# Patient Record
Sex: Male | Born: 1945
Health system: Southern US, Community
[De-identification: ages and names within clinical notes are randomized; demographics above are authoritative.]

## PROBLEM LIST (undated history)

## (undated) DIAGNOSIS — E119 Type 2 diabetes mellitus without complications: Secondary | ICD-10-CM

## (undated) DIAGNOSIS — I10 Essential (primary) hypertension: Secondary | ICD-10-CM

## (undated) DIAGNOSIS — H269 Unspecified cataract: Secondary | ICD-10-CM

## (undated) DIAGNOSIS — M1990 Unspecified osteoarthritis, unspecified site: Secondary | ICD-10-CM

---

## 2002-01-18 ENCOUNTER — Encounter: Admission: RE | Admit: 2002-01-18 | Discharge: 2002-04-18 | Payer: Self-pay | Admitting: Pulmonary Disease

## 2002-04-26 ENCOUNTER — Encounter: Admission: RE | Admit: 2002-04-26 | Discharge: 2002-07-25 | Payer: Self-pay | Admitting: Pulmonary Disease

## 2002-07-25 ENCOUNTER — Encounter: Admission: RE | Admit: 2002-07-25 | Discharge: 2002-10-23 | Payer: Self-pay | Admitting: Pulmonary Disease

## 2004-02-23 ENCOUNTER — Emergency Department (HOSPITAL_COMMUNITY): Admission: EM | Admit: 2004-02-23 | Discharge: 2004-02-23 | Payer: Self-pay | Admitting: Emergency Medicine

## 2010-11-10 ENCOUNTER — Encounter: Payer: Self-pay | Admitting: Internal Medicine

## 2010-12-15 ENCOUNTER — Other Ambulatory Visit: Payer: Self-pay | Admitting: Internal Medicine

## 2012-03-03 ENCOUNTER — Telehealth: Payer: Self-pay

## 2012-03-03 NOTE — Telephone Encounter (Signed)
LM for pt to call to schedule colonoscopy.

## 2012-04-04 NOTE — Telephone Encounter (Signed)
LMOM to call.

## 2012-04-15 NOTE — Telephone Encounter (Signed)
Letter to pt and PCP.  

## 2013-09-23 ENCOUNTER — Emergency Department (HOSPITAL_COMMUNITY): Payer: 59

## 2013-09-23 ENCOUNTER — Inpatient Hospital Stay (HOSPITAL_COMMUNITY)
Admission: EM | Admit: 2013-09-23 | Discharge: 2013-09-29 | DRG: 552 | Disposition: A | Discharge status: Rehabilitation Facility | Payer: 59 | Attending: General Surgery | Admitting: General Surgery

## 2013-09-23 ENCOUNTER — Encounter (HOSPITAL_COMMUNITY): Payer: Self-pay | Admitting: Radiology

## 2013-09-23 DIAGNOSIS — T07XXXA Unspecified multiple injuries, initial encounter: Secondary | ICD-10-CM | POA: Diagnosis present

## 2013-09-23 DIAGNOSIS — S12000A Unspecified displaced fracture of first cervical vertebra, initial encounter for closed fracture: Secondary | ICD-10-CM | POA: Diagnosis present

## 2013-09-23 DIAGNOSIS — S129XXA Fracture of neck, unspecified, initial encounter: Secondary | ICD-10-CM

## 2013-09-23 DIAGNOSIS — R339 Retention of urine, unspecified: Secondary | ICD-10-CM | POA: Diagnosis present

## 2013-09-23 DIAGNOSIS — S0100XA Unspecified open wound of scalp, initial encounter: Secondary | ICD-10-CM | POA: Diagnosis present

## 2013-09-23 DIAGNOSIS — S27329A Contusion of lung, unspecified, initial encounter: Secondary | ICD-10-CM

## 2013-09-23 DIAGNOSIS — I10 Essential (primary) hypertension: Secondary | ICD-10-CM | POA: Diagnosis present

## 2013-09-23 DIAGNOSIS — G8190 Hemiplegia, unspecified affecting unspecified side: Secondary | ICD-10-CM | POA: Diagnosis present

## 2013-09-23 DIAGNOSIS — S0101XA Laceration without foreign body of scalp, initial encounter: Secondary | ICD-10-CM

## 2013-09-23 DIAGNOSIS — M542 Cervicalgia: Secondary | ICD-10-CM | POA: Diagnosis present

## 2013-09-23 DIAGNOSIS — E119 Type 2 diabetes mellitus without complications: Secondary | ICD-10-CM | POA: Diagnosis present

## 2013-09-23 DIAGNOSIS — Z5189 Encounter for other specified aftercare: Secondary | ICD-10-CM | POA: Diagnosis not present

## 2013-09-23 DIAGNOSIS — D62 Acute posthemorrhagic anemia: Secondary | ICD-10-CM | POA: Diagnosis not present

## 2013-09-23 DIAGNOSIS — S12200A Unspecified displaced fracture of third cervical vertebra, initial encounter for closed fracture: Secondary | ICD-10-CM | POA: Diagnosis present

## 2013-09-23 DIAGNOSIS — S0190XA Unspecified open wound of unspecified part of head, initial encounter: Secondary | ICD-10-CM

## 2013-09-23 DIAGNOSIS — S14109A Unspecified injury at unspecified level of cervical spinal cord, initial encounter: Secondary | ICD-10-CM

## 2013-09-23 HISTORY — DX: Essential (primary) hypertension: I10

## 2013-09-23 HISTORY — DX: Type 2 diabetes mellitus without complications: E11.9

## 2013-09-23 LAB — COMPREHENSIVE METABOLIC PANEL
ALT: 20 U/L (ref 0–53)
AST: 22 U/L (ref 0–37)
Albumin: 3.3 g/dL — ABNORMAL LOW (ref 3.5–5.2)
Alkaline Phosphatase: 43 U/L (ref 39–117)
Anion gap: 13 (ref 5–15)
BUN: 21 mg/dL (ref 6–23)
CO2: 21 mEq/L (ref 19–32)
Calcium: 8 mg/dL — ABNORMAL LOW (ref 8.4–10.5)
Chloride: 109 mEq/L (ref 96–112)
Creatinine, Ser: 1.1 mg/dL (ref 0.50–1.35)
GFR calc Af Amer: 79 mL/min — ABNORMAL LOW (ref >90)
GFR calc non Af Amer: 68 mL/min — ABNORMAL LOW (ref >90)
Glucose, Bld: 186 mg/dL — ABNORMAL HIGH (ref 70–99)
Potassium: 3.9 mEq/L (ref 3.7–5.3)
Sodium: 143 mEq/L (ref 137–147)
Total Bilirubin: 0.4 mg/dL (ref 0.3–1.2)
Total Protein: 5.8 g/dL — ABNORMAL LOW (ref 6.0–8.3)

## 2013-09-23 LAB — URINALYSIS, ROUTINE W REFLEX MICROSCOPIC
Bilirubin Urine: NEGATIVE
Glucose, UA: >1000 mg/dL — AB
Hgb urine dipstick: NEGATIVE
Ketones, ur: 15 mg/dL — AB
Leukocytes, UA: NEGATIVE
Nitrite: NEGATIVE
Protein, ur: NEGATIVE mg/dL
Specific Gravity, Urine: 1.01 (ref 1.005–1.030)
Urobilinogen, UA: 0.2 mg/dL (ref 0.0–1.0)
pH: 6.5 (ref 5.0–8.0)

## 2013-09-23 LAB — CBC
HCT: 37.6 % — ABNORMAL LOW (ref 39.0–52.0)
Hemoglobin: 12.1 g/dL — ABNORMAL LOW (ref 13.0–17.0)
MCH: 27.6 pg (ref 26.0–34.0)
MCHC: 32.2 g/dL (ref 30.0–36.0)
MCV: 85.8 fL (ref 78.0–100.0)
Platelets: 193 10*3/uL (ref 150–400)
RBC: 4.38 MIL/uL (ref 4.22–5.81)
RDW: 12.4 % (ref 11.5–15.5)
WBC: 6 10*3/uL (ref 4.0–10.5)

## 2013-09-23 LAB — I-STAT CHEM 8, ED
BUN: 22 mg/dL (ref 6–23)
Calcium, Ion: 1.13 mmol/L (ref 1.13–1.30)
Chloride: 107 mEq/L (ref 96–112)
Creatinine, Ser: 1.2 mg/dL (ref 0.50–1.35)
Glucose, Bld: 189 mg/dL — ABNORMAL HIGH (ref 70–99)
HCT: 38 % — ABNORMAL LOW (ref 39.0–52.0)
Hemoglobin: 12.9 g/dL — ABNORMAL LOW (ref 13.0–17.0)
Potassium: 3.6 mEq/L — ABNORMAL LOW (ref 3.7–5.3)
Sodium: 143 mEq/L (ref 137–147)
TCO2: 22 mmol/L (ref 0–100)

## 2013-09-23 LAB — URINE MICROSCOPIC-ADD ON

## 2013-09-23 LAB — SAMPLE TO BLOOD BANK

## 2013-09-23 LAB — GLUCOSE, CAPILLARY
Glucose-Capillary: 208 mg/dL — ABNORMAL HIGH (ref 70–99)
Glucose-Capillary: 216 mg/dL — ABNORMAL HIGH (ref 70–99)
Glucose-Capillary: 228 mg/dL — ABNORMAL HIGH (ref 70–99)

## 2013-09-23 LAB — PROTIME-INR
INR: 1.04 (ref 0.00–1.49)
Prothrombin Time: 13.6 seconds (ref 11.6–15.2)

## 2013-09-23 LAB — ETHANOL: Alcohol, Ethyl (B): <11 mg/dL (ref 0–11)

## 2013-09-23 LAB — MRSA PCR SCREENING: MRSA by PCR: NEGATIVE

## 2013-09-23 LAB — CBG MONITORING, ED: Glucose-Capillary: 192 mg/dL — ABNORMAL HIGH (ref 70–99)

## 2013-09-23 LAB — I-STAT CG4 LACTIC ACID, ED: Lactic Acid, Venous: 0.7 mmol/L (ref 0.5–2.2)

## 2013-09-23 LAB — CDS SEROLOGY

## 2013-09-23 MED ORDER — OXYCODONE HCL 5 MG PO TABS
5.0000 mg | ORAL_TABLET | ORAL | Status: DC | PRN
  Administered 2013-09-23 – 2013-09-26 (×5): 5 mg via ORAL
  Filled 2013-09-23 (×7): qty 1

## 2013-09-23 MED ORDER — CETYLPYRIDINIUM CHLORIDE 0.05 % MT LIQD
7.0000 mL | Freq: Two times a day (BID) | OROMUCOSAL | Status: DC
  Administered 2013-09-23 – 2013-09-29 (×13): 7 mL via OROMUCOSAL

## 2013-09-23 MED ORDER — DOCUSATE SODIUM 100 MG PO CAPS
100.0000 mg | ORAL_CAPSULE | Freq: Two times a day (BID) | ORAL | Status: DC
  Administered 2013-09-23 – 2013-09-26 (×8): 100 mg via ORAL
  Filled 2013-09-23 (×8): qty 1

## 2013-09-23 MED ORDER — FENTANYL CITRATE 0.05 MG/ML IJ SOLN
INTRAMUSCULAR | Status: AC
  Filled 2013-09-23: qty 2

## 2013-09-23 MED ORDER — ACETAMINOPHEN 325 MG PO TABS: 650.0000 mg | ORAL_TABLET | ORAL | Status: DC | PRN

## 2013-09-23 MED ORDER — ONDANSETRON HCL 4 MG PO TABS: 4.0000 mg | ORAL_TABLET | Freq: Four times a day (QID) | ORAL | Status: DC | PRN

## 2013-09-23 MED ORDER — CEFAZOLIN SODIUM-DEXTROSE 2-3 GM-% IV SOLR
INTRAVENOUS | Status: AC
  Administered 2013-09-23: 2000 mg via INTRAVENOUS
  Filled 2013-09-23: qty 50

## 2013-09-23 MED ORDER — OXYCODONE HCL 5 MG PO TABS
10.0000 mg | ORAL_TABLET | ORAL | Status: DC | PRN
  Administered 2013-09-23 – 2013-09-27 (×7): 10 mg via ORAL
  Filled 2013-09-23 (×6): qty 2

## 2013-09-23 MED ORDER — PANTOPRAZOLE SODIUM 40 MG PO TBEC
40.0000 mg | DELAYED_RELEASE_TABLET | Freq: Every day | ORAL | Status: DC
  Administered 2013-09-25 – 2013-09-29 (×5): 40 mg via ORAL
  Filled 2013-09-23 (×5): qty 1

## 2013-09-23 MED ORDER — ONDANSETRON HCL 4 MG/2ML IJ SOLN: 4.0000 mg | Freq: Four times a day (QID) | INTRAMUSCULAR | Status: DC | PRN

## 2013-09-23 MED ORDER — HYDROMORPHONE HCL PF 1 MG/ML IJ SOLN
INTRAMUSCULAR | Status: AC
  Administered 2013-09-23: 1 mg
  Filled 2013-09-23: qty 1

## 2013-09-23 MED ORDER — INSULIN ASPART 100 UNIT/ML ~~LOC~~ SOLN
0.0000 [IU] | Freq: Three times a day (TID) | SUBCUTANEOUS | Status: DC
  Administered 2013-09-23 (×2): 3 [IU] via SUBCUTANEOUS
  Administered 2013-09-24: 1 [IU] via SUBCUTANEOUS
  Administered 2013-09-24 – 2013-09-26 (×6): 2 [IU] via SUBCUTANEOUS
  Administered 2013-09-26: 1 [IU] via SUBCUTANEOUS
  Administered 2013-09-26: 2 [IU] via SUBCUTANEOUS
  Administered 2013-09-27 (×2): 1 [IU] via SUBCUTANEOUS
  Administered 2013-09-27: 2 [IU] via SUBCUTANEOUS
  Administered 2013-09-28: 1 [IU] via SUBCUTANEOUS
  Administered 2013-09-28: 2 [IU] via SUBCUTANEOUS

## 2013-09-23 MED ORDER — INSULIN ASPART 100 UNIT/ML ~~LOC~~ SOLN: 0.0000 [IU] | Freq: Every day | SUBCUTANEOUS | Status: DC

## 2013-09-23 MED ORDER — PANTOPRAZOLE SODIUM 40 MG IV SOLR
40.0000 mg | Freq: Every day | INTRAVENOUS | Status: DC
  Administered 2013-09-23 – 2013-09-24 (×2): 40 mg via INTRAVENOUS
  Filled 2013-09-23 (×2): qty 40

## 2013-09-23 MED ORDER — IOHEXOL 300 MG/ML  SOLN
80.0000 mL | Freq: Once | INTRAMUSCULAR | Status: AC | PRN
  Administered 2013-09-23: 80 mL via INTRAVENOUS

## 2013-09-23 MED ORDER — HYDROMORPHONE HCL PF 1 MG/ML IJ SOLN
1.0000 mg | INTRAMUSCULAR | Status: DC | PRN
  Administered 2013-09-23 – 2013-09-24 (×4): 1 mg via INTRAVENOUS
  Filled 2013-09-23 (×4): qty 1

## 2013-09-23 MED ORDER — TETANUS-DIPHTH-ACELL PERTUSSIS 5-2.5-18.5 LF-MCG/0.5 IM SUSP
0.5000 mL | Freq: Once | INTRAMUSCULAR | Status: AC
  Administered 2013-09-23: 0.5 mL via INTRAMUSCULAR

## 2013-09-23 MED ORDER — FENTANYL CITRATE 0.05 MG/ML IJ SOLN
50.0000 ug | Freq: Once | INTRAMUSCULAR | Status: AC
Start: 2013-09-23 — End: 2013-09-23
  Administered 2013-09-23: 50 ug via INTRAVENOUS

## 2013-09-23 MED ORDER — ONDANSETRON HCL 4 MG/2ML IJ SOLN
4.0000 mg | Freq: Once | INTRAMUSCULAR | Status: AC
  Administered 2013-09-23: 4 mg via INTRAVENOUS

## 2013-09-23 MED ORDER — ONDANSETRON HCL 4 MG/2ML IJ SOLN
INTRAMUSCULAR | Status: AC
  Filled 2013-09-23: qty 2

## 2013-09-23 MED ORDER — KCL IN DEXTROSE-NACL 20-5-0.45 MEQ/L-%-% IV SOLN
INTRAVENOUS | Status: DC
  Administered 2013-09-23: 100 mL via INTRAVENOUS
  Administered 2013-09-23: 100 mL/h via INTRAVENOUS
  Administered 2013-09-24: 03:00:00 via INTRAVENOUS
  Administered 2013-09-24: 10 mL via INTRAVENOUS
  Administered 2013-09-25 – 2013-09-26 (×2): via INTRAVENOUS
  Filled 2013-09-23 (×8): qty 1000

## 2013-09-23 MED ORDER — TETANUS-DIPHTH-ACELL PERTUSSIS 5-2.5-18.5 LF-MCG/0.5 IM SUSP
INTRAMUSCULAR | Status: AC
  Filled 2013-09-23: qty 0.5

## 2013-09-23 MED ORDER — ENOXAPARIN SODIUM 40 MG/0.4ML ~~LOC~~ SOLN
40.0000 mg | SUBCUTANEOUS | Status: DC
Start: 2013-09-23 — End: 2013-09-29
  Administered 2013-09-23 – 2013-09-29 (×7): 40 mg via SUBCUTANEOUS
  Filled 2013-09-23 (×7): qty 0.4

## 2013-09-23 MED ORDER — CEFAZOLIN SODIUM-DEXTROSE 2-3 GM-% IV SOLR
2.0000 g | Freq: Once | INTRAVENOUS | Status: AC
  Administered 2013-09-23: 2000 mg via INTRAVENOUS

## 2013-09-23 MED ORDER — SODIUM CHLORIDE 0.9 % IV SOLN
Freq: Once | INTRAVENOUS | Status: AC
  Administered 2013-09-23: 10 mL/h via INTRAVENOUS

## 2013-09-23 NOTE — ED Notes (Addendum)
Tolerating irrigation. Glass and gravel removed.

## 2013-09-23 NOTE — ED Notes (Signed)
Back into trauma B, no changes, pt remains flat, calm, NAD, interactive, skin W&D, resps e/u, no dyspnea noted, follows commands, moving RUE and RLE, unable to move LUE or LLE. Remains on NRB, temp 97.3 oral, new warm blankets applied.  Room temp setting remains on "warm".

## 2013-09-23 NOTE — ED Notes (Signed)
Pt onto CT table, spinal precautions maintained. Dr. Janee Mornhompson Trauma present. VSS. No changes. Pt remains alert, NAD, calm, interactive.

## 2013-09-23 NOTE — ED Notes (Signed)
Dr. [facility]

## 2013-09-23 NOTE — Progress Notes (Addendum)
Patient ID: [name], male   DOB: 12-08-1945, 68 y.o.   MRN: [id number] Follow up - Trauma and Critical Care  Patient Details:    [name] is an 68 y.o. male.  Lines/tubes :   Microbiology/Sepsis markers: Results for orders placed during the hospital encounter of 09/23/13  MRSA PCR SCREENING     Status: None   Collection Time    09/23/13  4:45 AM      Result Value Ref Range Status   MRSA by PCR NEGATIVE  NEGATIVE Final   Comment:            The GeneXpert MRSA Assay (FDA     approved for NASAL specimens     only), is one component of a     comprehensive MRSA colonization     surveillance program. It is not     intended to diagnose MRSA     infection nor to guide or     monitor treatment for     MRSA infections.    Anti-infectives:  Anti-infectives   Start     Dose/Rate Route Frequency Ordered Stop   09/23/13 0315  ceFAZolin (ANCEF) IVPB 2 g/50 mL premix     2 g 100 mL/hr over 30 Minutes Intravenous  Once 09/23/13 0301 09/23/13 0325      Best Practice/Protocols:  VTE Prophylaxis: Lovenox (prophylaxtic dose) GI Prophylaxis: Proton Pump Inhibitor  Consults:    Neurosurgery - [facility]  Events:  Subjective:    Overnight Issues: Complaining of shoulder pain.    Objective:  Vital signs for last 24 hours: Temp:  [97.3 F (36.3 C)-97.7 F (36.5 C)] 97.7 F (36.5 C) (08/01 0809) Pulse Rate:  [50-75] 63 (08/01 0700) Resp:  [15-30] 18 (08/01 0700) BP: (104-134)/(50-76) 129/56 mmHg (08/01 0700) SpO2:  [93 %-100 %] 100 % (08/01 0700) Weight:  [237 lb 7 oz (107.7 kg)] 237 lb 7 oz (107.7 kg) (08/01 0445)  Hemodynamic parameters for last 24 hours:    Intake/Output from previous day: 07/31 0701 - 08/01 0700 In: 866.7 [I.V.:866.7] Out: -   Intake/Output this shift:    Vent settings for last 24 hours:   n/a Physical Exam:  General: alert and no respiratory distress Neuro: alert, oriented and unable to move left side.  weak upper and lower right  extremity, but can follow commands. prob 2/5 strength.  + sensation on left Resp: clear to auscultation bilaterally CVS: RR&R GI: soft, nontender, BS WNL, no r/g Skin: no rash Extremities: no edema, no erythema, pulses WNL HEENT - numerous abrasions/cuts on head.  Repaired.    Results for orders placed during the hospital encounter of 09/23/13 (from the past 24 hour(s))  CDS SEROLOGY     Status: None   Collection Time    09/23/13  1:34 AM      Result Value Ref Range   CDS serology specimen       Value: SPECIMEN WILL BE HELD FOR 14 DAYS IF TESTING IS REQUIRED  COMPREHENSIVE METABOLIC PANEL     Status: Abnormal   Collection Time    09/23/13  1:34 AM      Result Value Ref Range   Sodium 143  137 - 147 mEq/L   Potassium 3.9  3.7 - 5.3 mEq/L   Chloride 109  96 - 112 mEq/L   CO2 21  19 - 32 mEq/L   Glucose, Bld 186 (*) 70 - 99 mg/dL   BUN 21  6 - 23 mg/dL  Creatinine, Ser 1.10  0.50 - 1.35 mg/dL   Calcium 8.0 (*) 8.4 - 10.5 mg/dL   Total Protein 5.8 (*) 6.0 - 8.3 g/dL   Albumin 3.3 (*) 3.5 - 5.2 g/dL   AST 22  0 - 37 U/L   ALT 20  0 - 53 U/L   Alkaline Phosphatase 43  39 - 117 U/L   Total Bilirubin 0.4  0.3 - 1.2 mg/dL   GFR calc non Af Amer 68 (*) >90 mL/min   GFR calc Af Amer 79 (*) >90 mL/min   Anion gap 13  5 - 15  CBC     Status: Abnormal   Collection Time    09/23/13  1:34 AM      Result Value Ref Range   WBC 6.0  4.0 - 10.5 K/uL   RBC 4.38  4.22 - 5.81 MIL/uL   Hemoglobin 12.1 (*) 13.0 - 17.0 g/dL   HCT 16.137.6 (*) 09.639.0 - 04.552.0 %   MCV 85.8  78.0 - 100.0 fL   MCH 27.6  26.0 - 34.0 pg   MCHC 32.2  30.0 - 36.0 g/dL   RDW 40.912.4  81.111.5 - 91.415.5 %   Platelets 193  150 - 400 K/uL  ETHANOL     Status: None   Collection Time    09/23/13  1:34 AM      Result Value Ref Range   Alcohol, Ethyl (B) <11  0 - 11 mg/dL  PROTIME-INR     Status: None   Collection Time    09/23/13  1:34 AM      Result Value Ref Range   Prothrombin Time 13.6  11.6 - 15.2 seconds   INR 1.04  0.00 -  1.49  SAMPLE TO BLOOD BANK     Status: None   Collection Time    09/23/13  1:34 AM      Result Value Ref Range   Blood Bank Specimen SAMPLE AVAILABLE FOR TESTING     Sample Expiration 09/24/2013    CBG MONITORING, ED     Status: Abnormal   Collection Time    09/23/13  1:43 AM      Result Value Ref Range   Glucose-Capillary 192 (*) 70 - 99 mg/dL  I-STAT CHEM 8, ED     Status: Abnormal   Collection Time    09/23/13  1:46 AM      Result Value Ref Range   Sodium 143  137 - 147 mEq/L   Potassium 3.6 (*) 3.7 - 5.3 mEq/L   Chloride 107  96 - 112 mEq/L   BUN 22  6 - 23 mg/dL   Creatinine, Ser 7.821.20  0.50 - 1.35 mg/dL   Glucose, Bld 956189 (*) 70 - 99 mg/dL   Calcium, Ion 2.131.13  0.861.13 - 1.30 mmol/L   TCO2 22  0 - 100 mmol/L   Hemoglobin 12.9 (*) 13.0 - 17.0 g/dL   HCT 57.838.0 (*) 46.939.0 - 62.952.0 %  I-STAT CG4 LACTIC ACID, ED     Status: None   Collection Time    09/23/13  1:47 AM      Result Value Ref Range   Lactic Acid, Venous 0.70  0.5 - 2.2 mmol/L  MRSA PCR SCREENING     Status: None   Collection Time    09/23/13  4:45 AM      Result Value Ref Range   MRSA by PCR NEGATIVE  NEGATIVE  GLUCOSE, CAPILLARY     Status:  Abnormal   Collection Time    09/23/13  8:14 AM      Result Value Ref Range   Glucose-Capillary 208 (*) 70 - 99 mg/dL     Assessment/Plan:   NEURO  Trauma-CNS:  spinal cord injury   Plan: neurosurg consult, supportive care, OT/PT consult.  PULM  Lung Trauma (with contusion of lung)   Plan: Pulmonary toilet, incentive spirometry  CARDIO  HTN -    Plan: hold antihypertensives for now.  Avoid hypotension to maintain good spinal cord perfusion.  RENAL  unable to assist self with voiding   Plan: Place foley  GI  No current issues   Plan: GI prophylaxis, clears until cabbell sees.    ID  Scalp lacerations   Plan: bacitracin to abrasions for cellulitis prevention  HEME  Anemia acute blood loss anemia)   Plan: Avoid other blood loss, no need to transfuse.    ENDO  Diabetes Mellitus (Type II)   Plan: Sliding scale insulin.    Global Issues   Will need consults for therapies and probable rehab consult.     LOS: 0 days   Additional comments:I reviewed the patient's new clinical lab test results.  Critical Care Total Time*: 32 min  Daziyah Cogan 09/23/2013  *Care during the described time interval was provided by me and/or other providers on the critical care team.  I have reviewed this patient's available data, including medical history, events of note, physical examination and test results as part of my evaluation.

## 2013-09-23 NOTE — ED Notes (Addendum)
Minimal movement of R leg noted in CT. Unable to move L upon command.

## 2013-09-23 NOTE — ED Notes (Signed)
cbg 192 

## 2013-09-23 NOTE — ED Notes (Signed)
Dr. Janee Mornhompson I&D and closing head wound. Pt remains A&O, interactive, calm.

## 2013-09-23 NOTE — ED Notes (Signed)
[facility]. Alert, NAD, calm, interactive. No changes.

## 2013-09-23 NOTE — ED Notes (Signed)
[facility]

## 2013-09-23 NOTE — ED Notes (Addendum)
CT ready, to CT, no changes.

## 2013-09-23 NOTE — ED Notes (Signed)
CBG Taken = 192

## 2013-09-23 NOTE — ED Notes (Addendum)
Moves R hand and FA, unable to grip or lift arm off of bed. Moves R foot, flexes and extends R foot, unable to lift leg off of bed. PERRL 2mm brisk.  Speech clear.Admits to a little sob and a little nausea. Denies dizziness. tolerated wound closure. Bleeding controlled. Hard collar replaced with ASPEN c-collar with EMT, c-spine precautions maintained.

## 2013-09-23 NOTE — Progress Notes (Signed)
Attempted insertion of foley catheter. Patient refused insertion. I educated him on the need for one and the procedure. He still refused and stated "maybe tomorrow".

## 2013-09-23 NOTE — Progress Notes (Signed)
Chaplain responded to level 2 trauma. No family present.
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
## 2013-09-23 NOTE — ED Notes (Signed)
Sister in law Gardiner Rhyme[facility]. Pending spouse arrival.

## 2013-09-23 NOTE — ED Notes (Addendum)
Chest and pelvis xray completed, preparing to go to CT.

## 2013-09-23 NOTE — Procedures (Signed)
Procedure note  Preprocedure diagnosis: Complex 12 cm scalp laceration, linear forhead laceration 2 cm x2 Post procedure diagnosis: Same Procedure: Simple closure complex 12 cm scalp laceration, Dermabond closure forehead lacerations 2 cm each Surgeon: Violeta GelinasBurke Ladajah Soltys Procedure: Emergency consent was obtained. Patient's scalp was prepped in sterile fashion. Local anesthetic was injected. The scalp was thoroughly irrigated of debris and glass. Some devitalized fragments of tissue were debrided. Complex laceration was then closed with a combination of staples and 3-0 Prolene sutures. 2 cm linear laceration left for head and 2 cm horizontal laceration above the right eye were both cleaned, prepped, and closed with Dermabond. He tolerated this well. Violeta GelinasBurke Victoria Henshaw, MD, MPH, FACS Trauma: [phone] General Surgery: [phone]

## 2013-09-23 NOTE — ED Notes (Signed)
CT complete, preparing to go back to trauma B, no changes, VSS.
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
## 2013-09-23 NOTE — ED Notes (Signed)
Attempting contact with wife Riley Lam(Eunice) [phone]

## 2013-09-24 ENCOUNTER — Inpatient Hospital Stay (HOSPITAL_COMMUNITY): Payer: 59

## 2013-09-24 LAB — GLUCOSE, CAPILLARY
Glucose-Capillary: 193 mg/dL — ABNORMAL HIGH (ref 70–99)
Glucose-Capillary: 173 mg/dL — ABNORMAL HIGH (ref 70–99)
Glucose-Capillary: 200 mg/dL — ABNORMAL HIGH (ref 70–99)
Glucose-Capillary: 129 mg/dL — ABNORMAL HIGH (ref 70–99)
Glucose-Capillary: 156 mg/dL — ABNORMAL HIGH (ref 70–99)
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
## 2013-09-25 DIAGNOSIS — IMO0002 Reserved for concepts with insufficient information to code with codable children: Secondary | ICD-10-CM

## 2013-09-25 LAB — BASIC METABOLIC PANEL
Anion gap: 9 (ref 5–15)
BUN: 11 mg/dL (ref 6–23)
CO2: 28 mEq/L (ref 19–32)
Calcium: 8.1 mg/dL — ABNORMAL LOW (ref 8.4–10.5)
Chloride: 104 mEq/L (ref 96–112)
Creatinine, Ser: 1.08 mg/dL (ref 0.50–1.35)
GFR calc Af Amer: 81 mL/min — ABNORMAL LOW (ref >90)
GFR calc non Af Amer: 70 mL/min — ABNORMAL LOW (ref >90)
Glucose, Bld: 139 mg/dL — ABNORMAL HIGH (ref 70–99)
Potassium: 4.5 mEq/L (ref 3.7–5.3)
Sodium: 141 mEq/L (ref 137–147)

## 2013-09-25 LAB — GLUCOSE, CAPILLARY
Glucose-Capillary: 187 mg/dL — ABNORMAL HIGH (ref 70–99)
Glucose-Capillary: 165 mg/dL — ABNORMAL HIGH (ref 70–99)
Glucose-Capillary: 164 mg/dL — ABNORMAL HIGH (ref 70–99)
Glucose-Capillary: 192 mg/dL — ABNORMAL HIGH (ref 70–99)

## 2013-09-25 LAB — CBC
HCT: 37.2 % — ABNORMAL LOW (ref 39.0–52.0)
Hemoglobin: 11.7 g/dL — ABNORMAL LOW (ref 13.0–17.0)
MCH: 27.6 pg (ref 26.0–34.0)
MCHC: 31.5 g/dL (ref 30.0–36.0)
MCV: 87.7 fL (ref 78.0–100.0)
Platelets: 164 10*3/uL (ref 150–400)
RBC: 4.24 MIL/uL (ref 4.22–5.81)
RDW: 12.4 % (ref 11.5–15.5)
WBC: 8.1 10*3/uL (ref 4.0–10.5)

## 2013-09-25 MED ORDER — METFORMIN HCL 500 MG PO TABS
250.0000 mg | ORAL_TABLET | Freq: Two times a day (BID) | ORAL | Status: DC
  Administered 2013-09-25 – 2013-09-29 (×8): 250 mg via ORAL
  Filled 2013-09-25 (×10): qty 1

## 2013-09-25 MED ORDER — GLYBURIDE 1.25 MG PO TABS
1.2500 mg | ORAL_TABLET | Freq: Two times a day (BID) | ORAL | Status: DC
  Administered 2013-09-25 – 2013-09-29 (×8): 1.25 mg via ORAL
  Filled 2013-09-25 (×11): qty 1

## 2013-09-25 MED ORDER — IRBESARTAN 150 MG PO TABS
150.0000 mg | ORAL_TABLET | Freq: Every day | ORAL | Status: DC
  Administered 2013-09-25 – 2013-09-29 (×5): 150 mg via ORAL
  Filled 2013-09-25 (×5): qty 1

## 2013-09-25 MED ORDER — AMLODIPINE BESYLATE-VALSARTAN 5-160 MG PO TABS: 1.0000 | ORAL_TABLET | Freq: Every day | ORAL | Status: DC

## 2013-09-25 MED ORDER — GLYBURIDE-METFORMIN 1.25-250 MG PO TABS: 1.0000 | ORAL_TABLET | Freq: Two times a day (BID) | ORAL | Status: DC

## 2013-09-25 MED ORDER — AMLODIPINE BESYLATE 5 MG PO TABS
5.0000 mg | ORAL_TABLET | Freq: Every day | ORAL | Status: DC
  Administered 2013-09-25 – 2013-09-29 (×5): 5 mg via ORAL
  Filled 2013-09-25 (×5): qty 1

## 2013-09-25 NOTE — Progress Notes (Signed)
Patient ID: [name], male   DOB: 1945-05-24, 68 y.o.   MRN: [id number] BP 155/59  Pulse 81  Temp(Src) 99.3 F (37.4 C) (Oral)  Resp 17  Ht 5\' 11"  (1.803 m)  Wt 107.7 kg (237 lb 7 oz)  BMI 33.13 kg/m2  SpO2 99% Alert, oriented x 4 Densely plegic on left side, below C6. Antigravity biceps, maybe triceps. Deltoid 2/5 No active compression, disc at C3/4 is small, but this area has more than likely seen abnormal movement as a result of ligamentous disruption. Will need stabilization in future but may go to rehab when they want him.

## 2013-09-25 NOTE — Progress Notes (Signed)
Trauma Service Note  Subjective: Patient is doing fine, being fed by his spouse.  Limited movement in his LLE, can flex left elbow and right elbow.  Minimal hand movement on either side.  Objective: Vital signs in last 24 hours: Temp:  [98 F (36.7 C)-99 F (37.2 C)] 98.6 F (37 C) (08/03 0736) Pulse Rate:  [62-83] 78 (08/03 0800) Resp:  [11-22] 19 (08/03 0800) BP: (127-162)/(49-70) 162/68 mmHg (08/03 0800) SpO2:  [98 %-100 %] 99 % (08/03 0800)    Intake/Output from previous day: 08/02 0701 - 08/03 0700 In: 2000 [P.O.:1580; I.V.:420] Out: 2370 [Urine:2370] Intake/Output this shift:    General: No acute distress.  Lungs: Clear to auscultation  Abd: Bening, eating without a problem  Extremities: No clinical signs or symptoms of DVT. Patient is on Lovenox.  Neuro: Intact  Lab Results: CBC   Recent Labs  09/23/13 0134 09/23/13 0146 09/25/13 0220  WBC 6.0  --  8.1  HGB 12.1* 12.9* 11.7*  HCT 37.6* 38.0* 37.2*  PLT 193  --  164   BMET  Recent Labs  09/23/13 0134 09/23/13 0146 09/25/13 0220  NA 143 143 141  K 3.9 3.6* 4.5  CL 109 107 104  CO2 21  --  28  GLUCOSE 186* 189* 139*  BUN 21 22 11   CREATININE 1.10 1.20 1.08  CALCIUM 8.0*  --  8.1*   PT/INR  Recent Labs  09/23/13 0134  LABPROT 13.6  INR 1.04   ABG No results found for this basename: PHART, PCO2, PO2, HCO3,  in the last 72 hours  Studies/Results: Mr Cervical Spine Wo Contrast  09/24/2013   CLINICAL DATA:  Cervical spine fracture. LEFT-greater-than-RIGHT weakness. C1 fracture.  EXAM: MRI CERVICAL SPINE WITHOUT CONTRAST  TECHNIQUE: Multiplanar, multisequence MR imaging of the cervical spine was performed. No intravenous contrast was administered.  COMPARISON:  09/23/2013.  FINDINGS: Alignment: Patient is tilted to the LEFT in the scanner. 2 mm posterior subluxation of C3 on C4. This may be traumatic in this patient with both prevertebral edema and interspinous ligament edema along with  cervical strain. Prior CT demonstrated C3 spinous process fracture.  Vertebrae: Bone marrow edema in the C3 spinous process. Edema is present in the anterior C1 ring with bilateral C1-C2 facet effusions. Craniocervical alignment appears within normal limits. Edema extends to the LEFT occipital condyles, likely reactive. Klippel-Feil with congenital fusion of C4-C5.  Cord: The cervical spinal canal is congenitally narrow. There is cord edema at C3, compatible with cord contusion. No epidural or intramedullary hematoma. Mild swelling at the level of the cord contusion.  Posterior Fossa: Within normal limits.  Vertebral Arteries: Flow voids present bilaterally.  Paraspinal tissues: As noted above, prevertebral edema associated with previous fracture. Cervical strain and interspinous ligament injuries centered around C3.  C1-C2: No epidural hematoma. The odontoid appears intact. No widening of the predental space on MRI with cervical collar present.  C2-C3:  Disc desiccation.  Central canal and foramina appear patent.  C3-C4: 2 mm retrolisthesis and a central disc protrusion produce moderate central stenosis. Mild posterior ligamentum flavum redundancy also contributes to the stenosis. Mild RIGHT foraminal stenosis associated with facet hypertrophy. LEFT neural foramen appears patent. The AP diameter of the thecal sac is just under 8 mm. Bilateral posttraumatic facet effusions are present.  C4-C5:  Rudimentary disc with ankylosis at C4-C5.  No stenosis.  C5-C6: Moderate central stenosis associated with LEFT paracentral disc protrusion which contacts and flattens the LEFT ventral aspect of the  cord with posterior displacement. LEFT foraminal encroachment is due to uncovertebral spurring. This potentially affects the LEFT C6 nerve. The RIGHT neural foramen appears patent.  C6-C7: Mild central stenosis associated with shallow broad-based disc osteophyte complex. Bilateral uncovertebral spurring produces bilateral foraminal  stenosis potentially affecting both C7 nerves.  C7-T1:  Central canal and foramina patent.  IMPRESSION: 1. Cervical spine fractures at C1 and C3 demonstrated on prior CT show expected bone marrow edema. Prevertebral and posterior paraspinal ligamentous and muscular edema consistent with cervical strain. 2. Cervical cord contusion at C3-C4, due to trauma and underlying stenosis. 2 mm of retrolisthesis of C3 on C4 may be traumatic or degenerative and likely contributed to the cord contusion. Mild swelling and edema in the cord without epidural hematoma. Central stenosis at C3-C4 is moderate. 3. 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 4. Congenitally narrow spinal canal with superimposed degenerative disease detailed above.   Electronically Signed   By: Andreas Newport M.D.   On: 09/24/2013 15:23    Anti-infectives: Anti-infectives   Start     Dose/Rate Route Frequency Ordered Stop   09/23/13 0315  ceFAZolin (ANCEF) IVPB 2 g/50 mL premix     2 g 100 mL/hr over 30 Minutes Intravenous  Once 09/23/13 0301 09/23/13 0325      Assessment/Plan: s/p  Transfer to SDU.  Rehab consultation.  LOS: 2 days   Marta Lamas. Gae Bon, MD, FACS [phone] Trauma Surgeon 09/25/2013

## 2013-09-25 NOTE — Progress Notes (Signed)
BP 155/59  Pulse 81  Temp(Src) 99.3 F (37.4 C) (Oral)  Resp 17  Ht 5\' 11"  (1.803 m)  Wt 107.7 kg (237 lb 7 oz)  BMI 33.13 kg/m2  SpO2 99% Alert and oriented x 4 No change neurologically, 2/5 at best in upper extremity Awaiting results of MRI, when it is performed.
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
## 2013-09-25 NOTE — Progress Notes (Signed)
Rehab Admissions Coordinator Note:  Patient was screened by [name] for appropriateness for an Inpatient Acute Rehab Consult.  At this time, we are recommending Inpatient Rehab consult.  Tejas Seawood, PT Rehabilitation Admissions Coordinator [phone]  
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
## 2013-09-26 DIAGNOSIS — R339 Retention of urine, unspecified: Secondary | ICD-10-CM

## 2013-09-26 DIAGNOSIS — IMO0002 Reserved for concepts with insufficient information to code with codable children: Secondary | ICD-10-CM

## 2013-09-26 LAB — GLUCOSE, CAPILLARY
Glucose-Capillary: 146 mg/dL — ABNORMAL HIGH (ref 70–99)
Glucose-Capillary: 192 mg/dL — ABNORMAL HIGH (ref 70–99)
Glucose-Capillary: 157 mg/dL — ABNORMAL HIGH (ref 70–99)
Glucose-Capillary: 142 mg/dL — ABNORMAL HIGH (ref 70–99)

## 2013-09-26 MED ORDER — TAMSULOSIN HCL 0.4 MG PO CAPS
0.4000 mg | ORAL_CAPSULE | Freq: Every day | ORAL | Status: DC
  Administered 2013-09-26 – 2013-09-29 (×4): 0.4 mg via ORAL
  Filled 2013-09-26 (×4): qty 1

## 2013-09-26 MED ORDER — BETHANECHOL CHLORIDE 25 MG PO TABS
25.0000 mg | ORAL_TABLET | Freq: Three times a day (TID) | ORAL | Status: DC
  Administered 2013-09-26 – 2013-09-29 (×10): 25 mg via ORAL
  Filled 2013-09-26 (×12): qty 1

## 2013-09-26 MED ORDER — SODIUM CHLORIDE 0.9 % IV BOLUS (SEPSIS)
500.0000 mL | Freq: Once | INTRAVENOUS | Status: AC
  Administered 2013-09-26: 500 mL via INTRAVENOUS

## 2013-09-26 NOTE — Progress Notes (Addendum)
Received orders to transfer, Pt and wife aware of transfer. Awaiting bed assignment. No s/s of acute distress noted, VS stable. [id number]: received bed assignment, attempted to call report to RN on 4N, awaiting call back. 1050: [facility]. VS stable.

## 2013-09-26 NOTE — Progress Notes (Signed)
UR completed.  Rhesa Forsberg, RN BSN MHA CCM Trauma/Neuro ICU Case Manager [phone]  
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
## 2013-09-26 NOTE — Progress Notes (Signed)
Pt complaining of being dizzy and light headed. BP checked manually 130/33. MD notified orders for 500cc NS bolus given. BP recheck after bolus 150/50.
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
## 2013-09-26 NOTE — Progress Notes (Signed)
Patient ID: [name], male   DOB: [birth year], 68 y.o.   MRN: [id number] BP 149/44  Pulse 80  Temp(Src) 98.8 F (37.1 C) (Oral)  Resp 18  Ht 5\' 10"  (1.778 m)  Wt 110.1 kg (242 lb 11.6 oz)  BMI 34.83 kg/m2  SpO2 98% Alert, oriented x 4 3/5 right,left biceps 2/5 triceps Intrinsics 1/5 Right lower extremity~3/5, left lower extremity with little movement Exam is stable. Will go to rehab soon. I will follow.

## 2013-09-27 DIAGNOSIS — I10 Essential (primary) hypertension: Secondary | ICD-10-CM | POA: Insufficient documentation

## 2013-09-27 DIAGNOSIS — S0101XA Laceration without foreign body of scalp, initial encounter: Secondary | ICD-10-CM

## 2013-09-27 DIAGNOSIS — D62 Acute posthemorrhagic anemia: Secondary | ICD-10-CM | POA: Diagnosis present

## 2013-09-27 DIAGNOSIS — S14109A Unspecified injury at unspecified level of cervical spinal cord, initial encounter: Secondary | ICD-10-CM

## 2013-09-27 DIAGNOSIS — T07XXXA Unspecified multiple injuries, initial encounter: Secondary | ICD-10-CM | POA: Diagnosis present

## 2013-09-27 DIAGNOSIS — E119 Type 2 diabetes mellitus without complications: Secondary | ICD-10-CM | POA: Insufficient documentation

## 2013-09-27 DIAGNOSIS — S129XXA Fracture of neck, unspecified, initial encounter: Secondary | ICD-10-CM | POA: Diagnosis present

## 2013-09-27 LAB — GLUCOSE, CAPILLARY
Glucose-Capillary: 127 mg/dL — ABNORMAL HIGH (ref 70–99)
Glucose-Capillary: 148 mg/dL — ABNORMAL HIGH (ref 70–99)
Glucose-Capillary: 154 mg/dL — ABNORMAL HIGH (ref 70–99)
Glucose-Capillary: 149 mg/dL — ABNORMAL HIGH (ref 70–99)

## 2013-09-27 MED ORDER — BACITRACIN ZINC 500 UNIT/GM EX OINT
TOPICAL_OINTMENT | Freq: Two times a day (BID) | CUTANEOUS | Status: DC
  Administered 2013-09-27 – 2013-09-29 (×5): via TOPICAL
  Filled 2013-09-27 (×2): qty 28.35

## 2013-09-27 MED ORDER — NAPROXEN 250 MG PO TABS
500.0000 mg | ORAL_TABLET | Freq: Two times a day (BID) | ORAL | Status: DC
Start: 2013-09-27 — End: 2013-09-29
  Administered 2013-09-27 – 2013-09-29 (×4): 500 mg via ORAL
  Filled 2013-09-27 (×4): qty 2

## 2013-09-27 MED ORDER — OXYCODONE HCL 5 MG PO TABS
5.0000 mg | ORAL_TABLET | ORAL | Status: DC | PRN
  Administered 2013-09-27: 5 mg via ORAL
  Administered 2013-09-27 – 2013-09-28 (×6): 10 mg via ORAL
  Administered 2013-09-29: 5 mg via ORAL
  Administered 2013-09-29 (×2): 10 mg via ORAL
  Filled 2013-09-27 (×5): qty 2
  Filled 2013-09-27: qty 3
  Filled 2013-09-27: qty 2
  Filled 2013-09-27: qty 1
  Filled 2013-09-27 (×2): qty 2

## 2013-09-27 MED ORDER — BISACODYL 10 MG RE SUPP
10.0000 mg | Freq: Every day | RECTAL | Status: DC | PRN
  Administered 2013-09-29: 10 mg via RECTAL
  Filled 2013-09-27: qty 1

## 2013-09-27 MED ORDER — DOCUSATE SODIUM 100 MG PO CAPS
200.0000 mg | ORAL_CAPSULE | Freq: Two times a day (BID) | ORAL | Status: DC
  Administered 2013-09-27 – 2013-09-29 (×5): 200 mg via ORAL
  Filled 2013-09-27 (×5): qty 2

## 2013-09-27 MED ORDER — HYDROMORPHONE HCL PF 1 MG/ML IJ SOLN: 0.5000 mg | INTRAMUSCULAR | Status: DC | PRN

## 2013-09-27 MED ORDER — POLYETHYLENE GLYCOL 3350 17 G PO PACK
17.0000 g | PACK | Freq: Every day | ORAL | Status: DC
  Administered 2013-09-27 – 2013-09-29 (×3): 17 g via ORAL
  Filled 2013-09-27 (×3): qty 1

## 2013-09-27 NOTE — Progress Notes (Signed)
Rehab admissions - I met with pt and his wife in follow up to my visit earlier today and explained the purpose of inpatient rehab. Further questions were answered and informational brochures were given. Pt had just returned to bed with nsg assistance after sitting up in recliner wheelchair. Pt was tired and drifted off to sleep during this discussion, but he did answer questions initially.  Wife provided baseline details and shared that she can provide 24 hr supervision as needed (she is a CNA as well). I explained that we are now waiting on insurance authorization from [facility] and pending his medical clearance and our bed availability, we possibly could admit pt to inpatient rehab tomorrow.  Pt's wife ask that I contact pt's son, Nithin Demeo and left a voicemail for him to return my call.  I will keep the pt/family and medical team aware of any updates as I wait for insurance authorization.  Please call me with any questions. Thanks.  Nanetta Batty, PT Rehabilitation Admissions Coordinator [phone]
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
## 2013-09-27 NOTE — Progress Notes (Signed)
Orthopedic Tech Progress Note Patient Details:  [name] 07-08-1945 [id number]  Ortho Devices Type of Ortho Device: Abdominal binder Ortho Device/Splint Interventions: Application   Shawnie PonsCammer, Jaydeen Odor Carol 09/27/2013, 11:12 AM
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
## 2013-09-27 NOTE — Progress Notes (Signed)
Neuro exam about the same. Plan CIR. Bowel regimine. Patient examined and I agree with the assessment and plan  Violeta GelinasBurke Disa Riedlinger, MD, MPH, FACS Trauma: [phone] General Surgery: [phone]  09/27/2013 9:53 AM
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
## 2013-09-27 NOTE — Progress Notes (Signed)
Rehab admissions - I briefly met with pt and gave initial information about our inpatient rehab program. OT was about to work with pt. Pt expressed interest in pursuing inpatient rehab and asked that I call his wife to give information as well.  I will now open his case with his [facility] to seek insurance authorization for inpatient rehab.  I gave the pt informational brochures about our rehab program and will come back later to talk with pt so as to not interfere with therapy.  I tried to call pt's wife and left a voicemail.  Please call me with any questions. Thanks.  Nanetta Batty, PT Rehabilitation Admissions Coordinator [phone]

## 2013-09-28 LAB — GLUCOSE, CAPILLARY
Glucose-Capillary: 131 mg/dL — ABNORMAL HIGH (ref 70–99)
Glucose-Capillary: 155 mg/dL — ABNORMAL HIGH (ref 70–99)
Glucose-Capillary: 110 mg/dL — ABNORMAL HIGH (ref 70–99)
Glucose-Capillary: 122 mg/dL — ABNORMAL HIGH (ref 70–99)
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
## 2013-09-28 NOTE — Progress Notes (Signed)
OT Cancellation Note  Patient Details Name: [name] MRN: [id number] DOB: May 19, 1945   Cancelled Treatment:     OT made visit during dinner to work on self-feeding goals, however pt recently up with PT and feels "worn out." Pt requested that OT come during lunch tomorrow. OT will continue to follow up with pt to increase independence with self-feeding.    [name], [name] [phone] 09/28/2013, 5:36 PM
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
## 2013-09-28 NOTE — Progress Notes (Signed)
Patient ID: [name], male   DOB: 1946/01/21, 68 y.o.   MRN: [id number] BP 150/60  Pulse 71  Temp(Src) 97.7 F (36.5 C) (Oral)  Resp 20  Ht 5\' 10"  (1.778 m)  Wt 110.1 kg (242 lb 11.6 oz)  BMI 34.83 kg/m2  SpO2 98% Alert and oriented x 4, speech is clear and fluent Better strength in left upper extremity, still 0-/5 in wrist extensors, intrinsics, grip Left much better, 2/5 grip,3/4 wrist extensors Not much change in left lower extremity.  Certainly can meet the criteria for inpatient rehab. Snf would undoubtedly set him back.

## 2013-09-28 NOTE — Progress Notes (Signed)
Rehab admissions - I am following pt's case and am waiting on further clarification from [employer] . [employer] has given an initial denial for inpatient rehab, stating that pt is currently at total assist and not appropriate for the intensity of inpatient rehab.   I met with pt and his wife/dtr and grandson to share this news and [employer] case manager Wallene Huh, RN was in the room speaking with pt and his family. Melissa and I discussed pt's case with the family and pt. Melissa plans to seek further clarification with [employer]. Pt and his family are strongly wanting to pursue inpatient rehab for aggressive rehab. Pt feels he can tolerate the intensity of inpatient rehab.  I spoke with Yvone Neu, PT and Fleeta Emmer, OT about pt's case and they were about to go see patient this afternoon.  I will follow pt's case and keep the pt/family and medical team aware of further updates. I updated Sharyn Lull, trauma case Freight forwarder and Raquel Sarna with social work as well.  Please call me with any questions. Thanks.  Nanetta Batty, PT Rehabilitation Admissions Coordinator [phone]

## 2013-09-29 ENCOUNTER — Inpatient Hospital Stay (HOSPITAL_COMMUNITY)
Admission: RE | Admit: 2013-09-29 | Discharge: 2013-10-27 | DRG: 945 | Disposition: A | Discharge status: Home Health | Payer: 59 | Source: Intra-hospital | Attending: Physical Medicine & Rehabilitation | Admitting: Physical Medicine & Rehabilitation

## 2013-09-29 DIAGNOSIS — E1149 Type 2 diabetes mellitus with other diabetic neurological complication: Secondary | ICD-10-CM | POA: Diagnosis present

## 2013-09-29 DIAGNOSIS — Z79899 Other long term (current) drug therapy: Secondary | ICD-10-CM | POA: Diagnosis not present

## 2013-09-29 DIAGNOSIS — K5900 Constipation, unspecified: Secondary | ICD-10-CM | POA: Diagnosis present

## 2013-09-29 DIAGNOSIS — K029 Dental caries, unspecified: Secondary | ICD-10-CM | POA: Diagnosis present

## 2013-09-29 DIAGNOSIS — S0100XA Unspecified open wound of scalp, initial encounter: Secondary | ICD-10-CM | POA: Diagnosis present

## 2013-09-29 DIAGNOSIS — R42 Dizziness and giddiness: Secondary | ICD-10-CM | POA: Diagnosis not present

## 2013-09-29 DIAGNOSIS — S069X0A Unspecified intracranial injury without loss of consciousness, initial encounter: Secondary | ICD-10-CM | POA: Diagnosis present

## 2013-09-29 DIAGNOSIS — S025XXA Fracture of tooth (traumatic), initial encounter for closed fracture: Secondary | ICD-10-CM | POA: Diagnosis present

## 2013-09-29 DIAGNOSIS — E1142 Type 2 diabetes mellitus with diabetic polyneuropathy: Secondary | ICD-10-CM | POA: Diagnosis present

## 2013-09-29 DIAGNOSIS — D62 Acute posthemorrhagic anemia: Secondary | ICD-10-CM

## 2013-09-29 DIAGNOSIS — IMO0002 Reserved for concepts with insufficient information to code with codable children: Secondary | ICD-10-CM

## 2013-09-29 DIAGNOSIS — S02109A Fracture of base of skull, unspecified side, initial encounter for closed fracture: Secondary | ICD-10-CM | POA: Diagnosis present

## 2013-09-29 DIAGNOSIS — E119 Type 2 diabetes mellitus without complications: Secondary | ICD-10-CM

## 2013-09-29 DIAGNOSIS — M24519 Contracture, unspecified shoulder: Secondary | ICD-10-CM | POA: Diagnosis present

## 2013-09-29 DIAGNOSIS — S0101XD Laceration without foreign body of scalp, subsequent encounter: Secondary | ICD-10-CM

## 2013-09-29 DIAGNOSIS — S129XXS Fracture of neck, unspecified, sequela: Secondary | ICD-10-CM

## 2013-09-29 DIAGNOSIS — R110 Nausea: Secondary | ICD-10-CM | POA: Diagnosis not present

## 2013-09-29 DIAGNOSIS — Z5189 Encounter for other specified aftercare: Principal | ICD-10-CM

## 2013-09-29 DIAGNOSIS — I10 Essential (primary) hypertension: Secondary | ICD-10-CM

## 2013-09-29 DIAGNOSIS — Y92410 Unspecified street and highway as the place of occurrence of the external cause: Secondary | ICD-10-CM

## 2013-09-29 DIAGNOSIS — G8389 Other specified paralytic syndromes: Secondary | ICD-10-CM | POA: Diagnosis present

## 2013-09-29 DIAGNOSIS — R339 Retention of urine, unspecified: Secondary | ICD-10-CM | POA: Diagnosis present

## 2013-09-29 DIAGNOSIS — N319 Neuromuscular dysfunction of bladder, unspecified: Secondary | ICD-10-CM | POA: Diagnosis present

## 2013-09-29 DIAGNOSIS — N390 Urinary tract infection, site not specified: Secondary | ICD-10-CM | POA: Diagnosis not present

## 2013-09-29 DIAGNOSIS — S022XXA Fracture of nasal bones, initial encounter for closed fracture: Secondary | ICD-10-CM | POA: Diagnosis present

## 2013-09-29 DIAGNOSIS — S14109A Unspecified injury at unspecified level of cervical spinal cord, initial encounter: Secondary | ICD-10-CM | POA: Diagnosis present

## 2013-09-29 DIAGNOSIS — F05 Delirium due to known physiological condition: Secondary | ICD-10-CM

## 2013-09-29 DIAGNOSIS — S0180XA Unspecified open wound of other part of head, initial encounter: Secondary | ICD-10-CM | POA: Diagnosis present

## 2013-09-29 DIAGNOSIS — S069X9A Unspecified intracranial injury with loss of consciousness of unspecified duration, initial encounter: Secondary | ICD-10-CM

## 2013-09-29 DIAGNOSIS — T07XXXA Unspecified multiple injuries, initial encounter: Secondary | ICD-10-CM

## 2013-09-29 DIAGNOSIS — S129XXD Fracture of neck, unspecified, subsequent encounter: Secondary | ICD-10-CM

## 2013-09-29 DIAGNOSIS — S069XAA Unspecified intracranial injury with loss of consciousness status unknown, initial encounter: Secondary | ICD-10-CM

## 2013-09-29 LAB — CBC
HCT: 39.6 % (ref 39.0–52.0)
Hemoglobin: 12.8 g/dL — ABNORMAL LOW (ref 13.0–17.0)
MCH: 28.1 pg (ref 26.0–34.0)
MCHC: 32.3 g/dL (ref 30.0–36.0)
MCV: 86.8 fL (ref 78.0–100.0)
Platelets: 220 10*3/uL (ref 150–400)
RBC: 4.56 MIL/uL (ref 4.22–5.81)
RDW: 12.2 % (ref 11.5–15.5)
WBC: 5.8 10*3/uL (ref 4.0–10.5)

## 2013-09-29 LAB — GLUCOSE, CAPILLARY
Glucose-Capillary: 112 mg/dL — ABNORMAL HIGH (ref 70–99)
Glucose-Capillary: 134 mg/dL — ABNORMAL HIGH (ref 70–99)
Glucose-Capillary: 131 mg/dL — ABNORMAL HIGH (ref 70–99)
Glucose-Capillary: 133 mg/dL — ABNORMAL HIGH (ref 70–99)

## 2013-09-29 LAB — CREATININE, SERUM
Creatinine, Ser: 0.96 mg/dL (ref 0.50–1.35)
GFR calc Af Amer: >90 mL/min (ref >90)
GFR calc non Af Amer: 84 mL/min — ABNORMAL LOW (ref >90)

## 2013-09-29 MED ORDER — METFORMIN HCL 500 MG PO TABS
250.0000 mg | ORAL_TABLET | Freq: Two times a day (BID) | ORAL | Status: DC
  Administered 2013-09-29 – 2013-10-04 (×10): 250 mg via ORAL
  Administered 2013-10-04: 12.5 mg via ORAL
  Administered 2013-10-05 – 2013-10-22 (×35): 250 mg via ORAL
  Administered 2013-10-22: 08:00:00 via ORAL
  Administered 2013-10-23 – 2013-10-27 (×9): 250 mg via ORAL
  Filled 2013-09-29 (×62): qty 1

## 2013-09-29 MED ORDER — INSULIN ASPART 100 UNIT/ML ~~LOC~~ SOLN: 0.0000 [IU] | Freq: Every day | SUBCUTANEOUS | Status: DC

## 2013-09-29 MED ORDER — DOCUSATE SODIUM 100 MG PO CAPS
200.0000 mg | ORAL_CAPSULE | Freq: Two times a day (BID) | ORAL | Status: DC
  Administered 2013-09-30 – 2013-10-06 (×13): 200 mg via ORAL
  Filled 2013-09-29 (×16): qty 2

## 2013-09-29 MED ORDER — TAMSULOSIN HCL 0.4 MG PO CAPS
0.4000 mg | ORAL_CAPSULE | Freq: Every day | ORAL | Status: DC
  Administered 2013-09-30 – 2013-10-26 (×27): 0.4 mg via ORAL
  Filled 2013-09-29 (×29): qty 1

## 2013-09-29 MED ORDER — ENOXAPARIN SODIUM 40 MG/0.4ML ~~LOC~~ SOLN: 40.0000 mg | SUBCUTANEOUS | Status: DC

## 2013-09-29 MED ORDER — ONDANSETRON HCL 4 MG PO TABS
4.0000 mg | ORAL_TABLET | Freq: Four times a day (QID) | ORAL | Status: DC | PRN
  Administered 2013-10-17 – 2013-10-21 (×2): 4 mg via ORAL
  Filled 2013-09-29 (×2): qty 1

## 2013-09-29 MED ORDER — BETHANECHOL CHLORIDE 10 MG PO TABS
10.0000 mg | ORAL_TABLET | Freq: Four times a day (QID) | ORAL | Status: DC
  Administered 2013-09-29 – 2013-10-09 (×39): 10 mg via ORAL
  Filled 2013-09-29 (×44): qty 1

## 2013-09-29 MED ORDER — BETHANECHOL CHLORIDE 10 MG PO TABS
10.0000 mg | ORAL_TABLET | Freq: Four times a day (QID) | ORAL | Status: DC
  Administered 2013-09-29: 10 mg via ORAL
  Filled 2013-09-29: qty 1

## 2013-09-29 MED ORDER — AMLODIPINE BESYLATE 5 MG PO TABS
5.0000 mg | ORAL_TABLET | Freq: Every day | ORAL | Status: DC
  Administered 2013-09-30 – 2013-10-27 (×28): 5 mg via ORAL
  Filled 2013-09-29 (×29): qty 1

## 2013-09-29 MED ORDER — ACETAMINOPHEN 325 MG PO TABS
650.0000 mg | ORAL_TABLET | ORAL | Status: DC | PRN
  Administered 2013-10-08 – 2013-10-25 (×10): 650 mg via ORAL
  Filled 2013-09-29 (×10): qty 2

## 2013-09-29 MED ORDER — SORBITOL 70 % SOLN
30.0000 mL | Freq: Every day | Status: DC | PRN
  Administered 2013-09-29 – 2013-10-05 (×3): 30 mL via ORAL
  Filled 2013-09-29 (×4): qty 30

## 2013-09-29 MED ORDER — CETYLPYRIDINIUM CHLORIDE 0.05 % MT LIQD
7.0000 mL | Freq: Two times a day (BID) | OROMUCOSAL | Status: DC
  Administered 2013-09-29 – 2013-10-27 (×52): 7 mL via OROMUCOSAL

## 2013-09-29 MED ORDER — NAPROXEN 500 MG PO TABS
500.0000 mg | ORAL_TABLET | Freq: Two times a day (BID) | ORAL | Status: DC
  Administered 2013-09-29 – 2013-10-27 (×56): 500 mg via ORAL
  Filled 2013-09-29 (×61): qty 1

## 2013-09-29 MED ORDER — GLYBURIDE 1.25 MG PO TABS
1.2500 mg | ORAL_TABLET | Freq: Two times a day (BID) | ORAL | Status: DC
  Administered 2013-09-29 – 2013-10-27 (×56): 1.25 mg via ORAL
  Filled 2013-09-29 (×58): qty 1

## 2013-09-29 MED ORDER — POLYETHYLENE GLYCOL 3350 17 G PO PACK
17.0000 g | PACK | Freq: Every day | ORAL | Status: DC
  Administered 2013-09-30 – 2013-10-02 (×3): 17 g via ORAL
  Filled 2013-09-29 (×4): qty 1

## 2013-09-29 MED ORDER — IRBESARTAN 150 MG PO TABS
150.0000 mg | ORAL_TABLET | Freq: Every day | ORAL | Status: DC
  Administered 2013-09-30: 150 mg via ORAL
  Filled 2013-09-29 (×2): qty 1

## 2013-09-29 MED ORDER — OXYCODONE HCL 5 MG PO TABS
5.0000 mg | ORAL_TABLET | ORAL | Status: DC | PRN
  Administered 2013-09-29 – 2013-10-02 (×9): 15 mg via ORAL
  Administered 2013-10-03: 10 mg via ORAL
  Administered 2013-10-03 – 2013-10-04 (×4): 15 mg via ORAL
  Administered 2013-10-04: 5 mg via ORAL
  Administered 2013-10-05: 15 mg via ORAL
  Administered 2013-10-05: 10 mg via ORAL
  Administered 2013-10-06 (×3): 15 mg via ORAL
  Administered 2013-10-07: 5 mg via ORAL
  Filled 2013-09-29: qty 1
  Filled 2013-09-29 (×5): qty 3
  Filled 2013-09-29 (×2): qty 2
  Filled 2013-09-29 (×4): qty 3
  Filled 2013-09-29: qty 1
  Filled 2013-09-29 (×8): qty 3

## 2013-09-29 MED ORDER — ONDANSETRON HCL 4 MG/2ML IJ SOLN
4.0000 mg | Freq: Four times a day (QID) | INTRAMUSCULAR | Status: DC | PRN
  Filled 2013-09-29: qty 2

## 2013-09-29 MED ORDER — BACITRACIN ZINC 500 UNIT/GM EX OINT
TOPICAL_OINTMENT | Freq: Two times a day (BID) | CUTANEOUS | Status: DC
  Administered 2013-09-30 – 2013-10-02 (×6): via TOPICAL
  Administered 2013-10-02: 1 via TOPICAL
  Administered 2013-10-03 – 2013-10-11 (×17): via TOPICAL
  Administered 2013-10-11: 1 via TOPICAL
  Administered 2013-10-12: 08:00:00 via TOPICAL
  Administered 2013-10-12: 1 via TOPICAL
  Administered 2013-10-13: 16:00:00 via TOPICAL
  Administered 2013-10-13: 1 via TOPICAL
  Administered 2013-10-14: 21:00:00 via TOPICAL
  Administered 2013-10-14: 1 via TOPICAL
  Administered 2013-10-15: 20:00:00 via TOPICAL
  Administered 2013-10-15: 1 via TOPICAL
  Administered 2013-10-16 (×2): via TOPICAL
  Administered 2013-10-17: 1 via TOPICAL
  Administered 2013-10-17 – 2013-10-24 (×15): via TOPICAL
  Filled 2013-09-29 (×2): qty 28.35
  Filled 2013-09-29: qty 15
  Filled 2013-09-29: qty 28.35

## 2013-09-29 MED ORDER — ENOXAPARIN SODIUM 40 MG/0.4ML ~~LOC~~ SOLN
40.0000 mg | Freq: Every day | SUBCUTANEOUS | Status: DC
  Administered 2013-09-30 – 2013-10-27 (×28): 40 mg via SUBCUTANEOUS
  Filled 2013-09-29 (×29): qty 0.4

## 2013-09-29 MED ORDER — BISACODYL 10 MG RE SUPP
10.0000 mg | Freq: Every day | RECTAL | Status: DC | PRN
  Filled 2013-09-29: qty 1
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
## 2013-09-29 NOTE — Progress Notes (Signed)
[facility]  After giving him some details from PT & OT he will go ahead and approve the Inpatient Reahb stay.  This patient has been seen and I agree with the findings and treatment plan.  Marta LamasJames O. Gae BonWyatt, III, MD, FACS [phone] (pager) [phone] (direct pager) Trauma Surgeon
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
## 2013-09-29 NOTE — Progress Notes (Signed)
Rehab admissions - We did receive word that insurance has now approved inpatient rehab stay following a peer to peer review with Dr. Lindie SpruceWyatt. I confirmed this news with [employer] Rn.  I received medical clearance from trauma and bed is available today. We will admit pt to inpatient rehab later today.  I called and updated pt's wife about the news and both pt/wife are pleased with this news. I will meet with them shortly to complete admission paperwork as pt is eating lunch now.  I called and updated Irving Burtonmily with social work and Radiographer, therapeuticMichelle case manager. Pt's RN is also aware.  Please call me with any questions. Thanks.  Juliann MuleJanine Jobin Montelongo, PT Rehabilitation Admissions Coordinator [phone]
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
## 2013-09-29 NOTE — Progress Notes (Signed)
Rehab admissions - I have met with pt and his wife and completed admission paper for inpatient rehab. Pt will be admitted later today to inpatient rehab. Rn aware. Family and pt are pleased. I called and spoke with pt's son Leroy Sea to share this update as well.  Please call me with any questions. Thanks.  Nanetta Batty, PT Rehabilitation Admissions Coordinator [phone]

## 2013-09-29 NOTE — Clinical Social Work Note (Addendum)
CSW met with pt and pt's family at bedside to discuss back-up discharge disposition (SNF) to CIR. Pt's wife stated pt's family only interest is for pt to be admitted to CIR. Pt's family denied SNF search at this time. CSW updated RNCM and CIR admissions liaison.  CSW to continue to follow and assist with discharge planning, if needed.  1:27pm CSW notified by CIR admissions liaison of pt's insurance authorization for CIR placement. Per CIR admissions liaison, pt to be admitted to CIR on 09/29/2013. CSW signing off.  Emily Vaughn, MSW, LCSWA Licensed Clinical Social Worker [id number] and [id number] [phone] 
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
## 2013-09-29 NOTE — Progress Notes (Signed)
Report given to 4W receiving nurse. Patient transferred out via bed in stable condition to room 4W15. Spouse present at the time of transfer and accompanied. .Marland Kitchen
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
## 2013-09-29 NOTE — Progress Notes (Signed)
Rehab admissions - I received an update from Quentin AngstMelissa Shreve, RN case manager with [facility] that she discussed pt's case with [facility] MD. Insurance is denying inpatient rehab. I spoke with pt and his wife about this news and they wish to appeal to this decision. Dr. Lindie SpruceWyatt, trauma attending will now complete a peer to peer review with [facility].  I will keep the pt/family and medical team aware of any updates. Pt remains a solid candidate for our inpatient rehab program and demonstrated good efforts with PT/OT yesterday afternoon. Pt would greatly benefit from the intensive inpatient rehab program.  Please call me with any questions. Thanks.  Juliann MuleJanine Suvi Archuletta, PT Rehabilitation Admissions Coordinator [phone]

## 2013-09-29 NOTE — Progress Notes (Signed)
Patient ID: [name], male   DOB: 1945/05/09, 68 y.o.   MRN: [id number] Patient arrived to floor via bed, escorted by nursing staff and family.  Upon assessment, noticed some purulent drainage from head wound.  MD paged, orders for wound culture in progress.  Patient states he needs to use bedpan, assisted to use bedpan, required manual disimpaction of small amount of hard stool, soft stool now noted in rectal vault.  Patient verbalized understanding of rehab process including safety plan, no questions asked.  Will continue to monitor. [name], [name] J, RN

## 2013-09-29 NOTE — Progress Notes (Signed)
The patient is doing exceedingly well and would benefit from inpatient rehab.    This patient has been seen and I agree with the findings and treatment plan.  Marta LamasJames O. Gae BonWyatt, III, MD, FACS [phone] (pager) [phone] (direct pager) Trauma Surgeon

## 2013-09-30 ENCOUNTER — Inpatient Hospital Stay (HOSPITAL_COMMUNITY): Payer: 59 | Admitting: *Deleted

## 2013-09-30 ENCOUNTER — Inpatient Hospital Stay (HOSPITAL_COMMUNITY): Payer: 59 | Admitting: Physical Therapy

## 2013-09-30 ENCOUNTER — Inpatient Hospital Stay (HOSPITAL_COMMUNITY): Payer: 59 | Admitting: Speech Pathology

## 2013-09-30 ENCOUNTER — Encounter (HOSPITAL_COMMUNITY): Payer: 59 | Admitting: *Deleted

## 2013-09-30 ENCOUNTER — Inpatient Hospital Stay (HOSPITAL_COMMUNITY): Payer: 59

## 2013-09-30 DIAGNOSIS — M7989 Other specified soft tissue disorders: Secondary | ICD-10-CM

## 2013-09-30 DIAGNOSIS — IMO0002 Reserved for concepts with insufficient information to code with codable children: Secondary | ICD-10-CM

## 2013-09-30 LAB — GLUCOSE, CAPILLARY
Glucose-Capillary: 124 mg/dL — ABNORMAL HIGH (ref 70–99)
Glucose-Capillary: 145 mg/dL — ABNORMAL HIGH (ref 70–99)
Glucose-Capillary: 155 mg/dL — ABNORMAL HIGH (ref 70–99)
Glucose-Capillary: 162 mg/dL — ABNORMAL HIGH (ref 70–99)

## 2013-09-30 MED ORDER — IRBESARTAN 300 MG PO TABS
300.0000 mg | ORAL_TABLET | Freq: Every day | ORAL | Status: DC
  Administered 2013-10-01 – 2013-10-26 (×26): 300 mg via ORAL
  Filled 2013-09-30 (×28): qty 1
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
## 2013-09-30 NOTE — Progress Notes (Signed)
Nursing Note: Pt scanned and then in and out cathed for 800 cc by [employer] CompanyJobell,NT wbb

## 2013-09-30 NOTE — Progress Notes (Signed)
Nursing Note: Pt had incont. Void.A: bladder scanned for 891 cc. R: Pt in and out cathed by Jobelle,NT for 1000 cc.Pt stated that he felt "so much better".wbb
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
## 2013-09-30 NOTE — Progress Notes (Signed)
VASCULAR LAB PRELIMINARY  PRELIMINARY  PRELIMINARY  PRELIMINARY  Bilateral lower extremity venous Dopplers completed.    Preliminary report:  There is no DVT or SVT noted in the bilateral lower extremities.   Niveah Boerner, RVT 09/30/2013, 5:43 PM
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
## 2013-10-01 ENCOUNTER — Inpatient Hospital Stay (HOSPITAL_COMMUNITY): Payer: 59 | Admitting: Physical Therapy

## 2013-10-01 LAB — GLUCOSE, CAPILLARY
Glucose-Capillary: 115 mg/dL — ABNORMAL HIGH (ref 70–99)
Glucose-Capillary: 136 mg/dL — ABNORMAL HIGH (ref 70–99)
Glucose-Capillary: 129 mg/dL — ABNORMAL HIGH (ref 70–99)
Glucose-Capillary: 117 mg/dL — ABNORMAL HIGH (ref 70–99)
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
## 2013-10-01 NOTE — Progress Notes (Signed)
Physical Therapy Session Note  Patient Details  Name: [name] MRN: [id number] Date of Birth: [birth year]  Today's Date: 10/01/2013 Time: 0932-1032 Time Calculation (min): 60 min  Short Term Goals: Week 1:  PT Short Term Goal 1 (Week 1): Pt will increased rolling R with side rail via log rolling and max to total [name].  PT Short Term Goal 2 (Week 1): Pt will increase rolling L with side rail via log rolling and max [name].  PT Short Term Goal 3 (Week 1): Pt will increase supine to edge of bed, edge of bed to supine with side rail and max [name].  PT Short Term Goal 4 (Week 1): Pt will increase transfers to sliding board with total  [name] x 2.  PT Short Term Goal 5 (Week 1): Pt will increase w/c mobility to 25 feet with max [name].   Skilled Therapeutic Interventions/Progress Updates:    Pt received supine in bed and stated his bladder felt full, alerted nursing and unable to perform in/out cath as bladder not full enough yet per nursing. Pt performed roll side to side x 3 reps with pt requiring max assist +2 to complete, pt is able to roll to the L with slight decreased assistance as compared to the R.  Therapist performed stretching and NMR to B LEs in supine with L LE displaying increased tone > with L hip flexion, decreased from max to min after stretching.  Abdominal binder and TED hose donned and performed supine to SL to sit with total assist +2 with pt attempting to assist and pt able to partially engage trunk to get to full upright sitting EOB, Pt c/o dizziness upon initial sitting and received instructions for deep breathing to improve, BP 128/68, pt improved gradually to CGA to min assist and 2-3 mins for c/o dizziness to improve.  NMR in sitting for WBing through R and L UEs, pt able to reach across midline with target and reach anterior with decreased motor control but able to reach target, seated R hip flexion and LAQs and therapist PROM to L LE. Pt able to tolerate approximately 20 minutes of sitting EOB  and then reported he felt his bladder was full again. Pt incontinent bladder, returned to supine, doffed abdominal binder and TED hose,  all needs within reach and nursing alerted to incontinence. Pt very pleasant and very motivated, sensation in B LE and UE seems to be improving per pt report.    Therapy Documentation Precautions:  Precautions Precautions: Cervical;Fall Precaution Comments: discussed log roll for cervical precautions Required Braces or Orthoses: Cervical Brace;Other Brace/Splint Cervical Brace: Hard collar;At all times Other Brace/Splint: don calf high ACE Wraps and Abd. binder Restrictions Weight Bearing Restrictions: No    Vital Signs:sitting EOB 128/68   Pain: Pain Assessment 5/10 cervical region Pain Score: 0-No pain                  See FIM for current functional status  Therapy/Group: Individual Therapy  [name], [name] 10/01/2013, 12:06 PM

## 2013-10-01 NOTE — IPOC Note (Signed)
Overall Plan of Care [facility](IPOC) Patient Details Name: [name] MRN: [id number] DOB: [birth year]  Admitting Diagnosis: Fxs TBI   Hospital Problems: Active Problems:   SCI (spinal cord injury)     Functional Problem List: Nursing Bladder;Bowel;Medication Management;Motor;Pain;Perception;Sensory;Skin Integrity  PT Balance;Endurance;Motor  OT Balance;Cognition;Edema;Endurance;Motor;Safety;Sensory  SLP Cognition;Safety  TR         Basic ADL's: OT Eating;Grooming;Bathing;Dressing;Toileting     Advanced  ADL's: OT       Transfers: PT Bed Mobility;Bed to Chair  OT Toilet     Locomotion: PT Wheelchair Mobility;Ambulation     Additional Impairments: OT Fuctional Use of Upper Extremity  SLP        TR      Anticipated Outcomes Item Anticipated Outcome  Self Feeding minimal assist  Swallowing  Ind   Basic self-care  minimal assist with bathing and dressing  Toileting  direct toileting needs with minimal verbal cues   Bathroom Transfers minimal assist to toilet  Bowel/Bladder  Min assist  Transfers  min A for bed mobility, min A for transfers  Locomotion  mod I for w/c mobility  Communication  Ind  Cognition  mod I  Pain  < 3  Safety/Judgment  Mod I   Therapy Plan: PT Intensity: Minimum of 1-2 x/day ,45 to 90 minutes PT Frequency: 5 out of 7 days PT Duration Estimated Length of Stay: 28 to 30 days OT Intensity: Minimum of 1-2 x/day, 45 to 90 minutes OT Frequency: 5 out of 7 days OT Duration/Estimated Length of Stay: 28-30 days SLP Intensity: Minumum of 1-2 x/day, 30 to 90 minutes SLP Frequency: 5 out of 7 days SLP Duration/Estimated Length of Stay: 2 weeks       Team Interventions: Nursing Interventions Patient/Family Education;Bladder Management;Bowel Management;Medication Management;Pain Management;Disease Management/Prevention;Skin Care/Wound Management;Dysphagia/Aspiration Precaution Training;Cognitive Remediation/Compensation;Discharge Planning   PT interventions Balance/vestibular training;Ambulation/gait training;DME/adaptive equipment instruction;Functional mobility training;Neuromuscular re-education;Discharge planning;Splinting/orthotics;UE/LE Coordination activities;UE/LE Strength taining/ROM;Therapeutic Exercise;Therapeutic Activities;Patient/family education;Stair training;Wheelchair propulsion/positioning  OT Interventions Balance/vestibular training;Cognitive remediation/compensation;Discharge planning;Community reintegration;Disease mangement/prevention;DME/adaptive equipment instruction;Functional electrical stimulation;Functional mobility training;Neuromuscular re-education;Pain management;Patient/family education;Psychosocial support;Self Care/advanced ADL retraining;Skin care/wound managment;Splinting/orthotics;Therapeutic Activities;Therapeutic Exercise;UE/LE Strength taining/ROM;UE/LE Coordination activities;Wheelchair propulsion/positioning  SLP Interventions Cognitive remediation/compensation;Environmental controls;Speech/Language facilitation;Cueing hierarchy;Functional tasks;Therapeutic Activities;Internal/external aids;Medication managment;Patient/family education  TR Interventions    SW/CM Interventions      Team Discharge Planning: Destination: PT-Home ,OT- Home , SLP-Home Projected Follow-up: PT-Home health PT, OT-  Home health OT;24 hour supervision/assistance, SLP-None Projected Equipment Needs: PT-To be determined, OT- 3 in 1 bedside comode (dropped arm 3n1 commode chair), SLP-None recommended by SLP Equipment Details: PT- , OT-  Patient/family involved in discharge planning: PT- Patient,  OT-Patient, SLP-Patient  MD ELOS: 25-30 days Medical Rehab Prognosis:  Excellent Assessment: The patient has been admitted for CIR therapies with the diagnosis of cervical fractures with SCI. The team will be addressing functional mobility, strength, stamina, balance, safety, adaptive techniques and equipment, self-care, bowel  and bladder mgt, patient and caregiver education, NMR, orthotics, pain mgt, egosupport, speech, swallowing, communication, and cognition. Goals have been set at mod I with speech and communication, min assist with self-care, and min assist to mod I with functional mobility.    [name], MD, FAAPMR      See Team Conference Notes for weekly updates to the plan of care

## 2013-10-02 ENCOUNTER — Inpatient Hospital Stay (HOSPITAL_COMMUNITY): Payer: 59 | Admitting: *Deleted

## 2013-10-02 ENCOUNTER — Inpatient Hospital Stay (HOSPITAL_COMMUNITY): Payer: 59 | Admitting: Speech Pathology

## 2013-10-02 ENCOUNTER — Inpatient Hospital Stay (HOSPITAL_COMMUNITY): Payer: 59 | Admitting: Physical Therapy

## 2013-10-02 ENCOUNTER — Inpatient Hospital Stay (HOSPITAL_COMMUNITY): Payer: 59

## 2013-10-02 DIAGNOSIS — IMO0002 Reserved for concepts with insufficient information to code with codable children: Secondary | ICD-10-CM

## 2013-10-02 DIAGNOSIS — D62 Acute posthemorrhagic anemia: Secondary | ICD-10-CM

## 2013-10-02 DIAGNOSIS — I10 Essential (primary) hypertension: Secondary | ICD-10-CM

## 2013-10-02 DIAGNOSIS — S069XAA Unspecified intracranial injury with loss of consciousness status unknown, initial encounter: Secondary | ICD-10-CM

## 2013-10-02 DIAGNOSIS — S069X9A Unspecified intracranial injury with loss of consciousness of unspecified duration, initial encounter: Secondary | ICD-10-CM

## 2013-10-02 LAB — COMPREHENSIVE METABOLIC PANEL
ALT: 65 U/L — ABNORMAL HIGH (ref 0–53)
AST: 55 U/L — ABNORMAL HIGH (ref 0–37)
Albumin: 3 g/dL — ABNORMAL LOW (ref 3.5–5.2)
Alkaline Phosphatase: 71 U/L (ref 39–117)
Anion gap: 11 (ref 5–15)
BUN: 28 mg/dL — ABNORMAL HIGH (ref 6–23)
CO2: 26 mEq/L (ref 19–32)
Calcium: 8.7 mg/dL (ref 8.4–10.5)
Chloride: 102 mEq/L (ref 96–112)
Creatinine, Ser: 1.16 mg/dL (ref 0.50–1.35)
GFR calc Af Amer: 74 mL/min — ABNORMAL LOW (ref >90)
GFR calc non Af Amer: 64 mL/min — ABNORMAL LOW (ref >90)
Glucose, Bld: 148 mg/dL — ABNORMAL HIGH (ref 70–99)
Potassium: 4.8 mEq/L (ref 3.7–5.3)
Sodium: 139 mEq/L (ref 137–147)
Total Bilirubin: 1 mg/dL (ref 0.3–1.2)
Total Protein: 6.3 g/dL (ref 6.0–8.3)

## 2013-10-02 LAB — CBC WITH DIFFERENTIAL/PLATELET
Basophils Absolute: 0 10*3/uL (ref 0.0–0.1)
Basophils Relative: 1 % (ref 0–1)
Eosinophils Absolute: 0.1 10*3/uL (ref 0.0–0.7)
Eosinophils Relative: 2 % (ref 0–5)
HCT: 35 % — ABNORMAL LOW (ref 39.0–52.0)
Hemoglobin: 11.3 g/dL — ABNORMAL LOW (ref 13.0–17.0)
Lymphocytes Relative: 11 % — ABNORMAL LOW (ref 12–46)
Lymphs Abs: 0.8 10*3/uL (ref 0.7–4.0)
MCH: 27.3 pg (ref 26.0–34.0)
MCHC: 32.3 g/dL (ref 30.0–36.0)
MCV: 84.5 fL (ref 78.0–100.0)
Monocytes Absolute: 0.6 10*3/uL (ref 0.1–1.0)
Monocytes Relative: 8 % (ref 3–12)
Neutro Abs: 6.3 10*3/uL (ref 1.7–7.7)
Neutrophils Relative %: 78 % — ABNORMAL HIGH (ref 43–77)
Platelets: 205 10*3/uL (ref 150–400)
RBC: 4.14 MIL/uL — ABNORMAL LOW (ref 4.22–5.81)
RDW: 12.1 % (ref 11.5–15.5)
WBC: 7.9 10*3/uL (ref 4.0–10.5)

## 2013-10-02 LAB — WOUND CULTURE: Special Requests: NORMAL

## 2013-10-02 LAB — GLUCOSE, CAPILLARY
Glucose-Capillary: 165 mg/dL — ABNORMAL HIGH (ref 70–99)
Glucose-Capillary: 163 mg/dL — ABNORMAL HIGH (ref 70–99)
Glucose-Capillary: 175 mg/dL — ABNORMAL HIGH (ref 70–99)
Glucose-Capillary: 141 mg/dL — ABNORMAL HIGH (ref 70–99)

## 2013-10-02 NOTE — Progress Notes (Signed)
Physical Therapy Session Note  Patient Details  Name: [name] MRN: [id number] Date of Birth: Dec 23, 1945  Today's Date: 10/02/2013 Time: 1000-1100 Time Calculation (min): 60 min  Short Term Goals: Week 1:  PT Short Term Goal 1 (Week 1): Pt will increased rolling R with side rail via log rolling and max to total A.  PT Short Term Goal 2 (Week 1): Pt will increase rolling L with side rail via log rolling and max A.  PT Short Term Goal 3 (Week 1): Pt will increase supine to edge of bed, edge of bed to supine with side rail and max A.  PT Short Term Goal 4 (Week 1): Pt will increase transfers to sliding board with total  A x 2.  PT Short Term Goal 5 (Week 1): Pt will increase w/c mobility to 25 feet with max A.   Skilled Therapeutic Interventions/Progress Updates:    Patient received sitting in wheelchair. Session focused on functional transfers, upright sitting EOM, UE reaching, and PROM/stretching of extremities. Patient transfers wheelchair<>mat [facility] and +2 assist for safety. Patient tolerates sitting EOM with totalA for approx 28' without any complaints, see below for vitals. Sitting EOM, emphasis on increasing upright posture and gross motor movement of B UE, tapping cones anteriorly and across midline. Patient able to initiate movement, but requires facilitation to sustain/complete movements. PROM stretching of all four extremities and patient reports "feeling so much better". Spoke with PT clinical specialist about initiating stretching program every morning. Patient left sitting in wheelchair for SLP session with all needs within reach.  Therapy Documentation Precautions:  Precautions Precautions: Cervical;Fall Precaution Comments: discussed log roll for cervical precautions Required Braces or Orthoses: Cervical Brace;Other Brace/Splint Cervical Brace: Hard collar;At all times Other Brace/Splint: don calf high ACE Wraps and Abd. binder Restrictions Weight Bearing  Restrictions: No Vital Signs: Therapy Vitals Pulse Rate: 88 BP: 128/78 mmHg Patient Position (if appropriate): Sitting Oxygen Therapy SpO2: 96 % O2 Device: Nasal cannula O2 Flow Rate (L/min): 3 L/min Pulse Oximetry Type: Intermittent Pain: Pain Assessment Pain Assessment: 0-10 Pain Score: 2  Pain Type: Acute pain Pain Location: Abdomen Pain Descriptors / Indicators: Constant Pain Onset: On-going Pain Intervention(s): RN made aware;Repositioned Multiple Pain Sites: No Locomotion : Ambulation Ambulation/Gait Assistance: Not tested (comment)   See FIM for current functional status  Therapy/Group: Individual Therapy  Chipper HerbBridget S Benzion Mesta S. Itzia Cunliffe, PT, DPT 10/02/2013, 12:20 PM
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
## 2013-10-02 NOTE — Progress Notes (Addendum)
Physical Therapy Session Note  Patient Details  Name: [name] MRN: [id number] Date of Birth: 06-Oct-1945  Today's Date: 10/02/2013 Time: 1330-1420 Time Calculation (min): 50 min  Short Term Goals: Week 1:  PT Short Term Goal 1 (Week 1): Pt will increased rolling R with side rail via log rolling and max to total A.  PT Short Term Goal 2 (Week 1): Pt will increase rolling L with side rail via log rolling and max A.  PT Short Term Goal 3 (Week 1): Pt will increase supine to edge of bed, edge of bed to supine with side rail and max A.  PT Short Term Goal 4 (Week 1): Pt will increase transfers to sliding board with total  A x 2.  PT Short Term Goal 5 (Week 1): Pt will increase w/c mobility to 25 feet with max A.   Skilled Therapeutic Interventions/Progress Updates:    Pt received tilted back in w/c, agreeable to participate in therapy. Pt expressed need for I/O cathing. Provided +2 Assist to place sling, TotalA to transfer pt w/c to bed w/ MaxiSky. Pt missed 10 minutes of scheduled PT time 2/2 nursing care w/ cathing. Educated pt on SCI packet, asked him to read through it at his own pace and think of any questions he might have. Therapists instituted out of bed schedule and hung in pt's room for nursing/therapy to document out of bed time. Pt very fatigued during PM session from being out of bed in chair for ~4 hrs this AM. After cathing pt worked on seated EOB tolerance and seated trunk control w/ MaxiSky and ModA from therapist to maintain upright posture. Pt tolerated mod resistance during alternating isometrics to facilitate trunk control. Pt moved sit>supine w/ MaxiSky TotalA, pt left supine in bed w/ all needs within reach.  Therapy Documentation Precautions:  Precautions Precautions: Cervical;Fall Precaution Comments: discussed log roll for cervical precautions Required Braces or Orthoses: Cervical Brace;Other Brace/Splint Cervical Brace: Hard collar;At all times Other Brace/Splint:  don calf high ACE Wraps and Abd. binder Restrictions Weight Bearing Restrictions: No General: Amount of Missed PT Time (min): 20 Minutes Missed Time Reason: Nursing care (Need for I/O cath) Vital Signs: Therapy Vitals Temp: 98.6 F (37 C) Temp src: Oral Pulse Rate: 74 Resp: 16 BP: 122/45 mmHg Patient Position (if appropriate): Lying Oxygen Therapy SpO2: 100 % O2 Device: Nasal cannula O2 Flow Rate (L/min): 3 L/min Pain: Pain Assessment Pain Assessment: No/denies pain Pain Score: 0-No pain  See FIM for current functional status  Therapy/Group: Individual Therapy  Hosie SpangleGodfrey, Minaal Struckman Hosie SpangleJess Amanat Hackel, PT, DPT 10/02/2013, 5:11 PM

## 2013-10-02 NOTE — Progress Notes (Signed)
Occupational Therapy Session Note  Patient Details  Name: [name] MRN: [id number] Date of Birth: 06/11/1945  Today's Date: 10/02/2013 Time: [id number] Time Calculation (min): 75 min  Short Term Goals: Week 1:  OT Short Term Goal 1 (Week 1): Pt. will drink from cup with straw with mod assist OT Short Term Goal 2 (Week 1): Pt. will feed self with AE with max assist  OT Short Term Goal 3 (Week 1): Pt. will maintain static sitting balance for 10 minutes EOB OT Short Term Goal 4 (Week 1): Pt. will bath self with max assist  Skilled Therapeutic Interventions/Progress Updates: ADL-retraining with focus on bed mobility, functional use of B-UE, static sitting balance, weight-shifting, and transfer training (squat-pivot, total assist).    Pt received supine in bed, attempting BM while sitting on bedpan.    Pt aware of improved ongoing BM and reported improved B-UE function during discussion on goals/objectives of treatment, demonstrating active right elbow flexion/extension (3-/5), shoulder elevation/depression, flexion and horizontal abduction at L-UE pt exhibits elbow/flexion (approx 2/5).  With total assist (two helpers), pt completed rolls, right to left, 5 times while assisted with extensive and prolonged toilet hygiene (d/t continuous loose BM) and with lower body bathing and dressing.   After donning diaper with total assist X2, pt rose to sit at edge of bed with total assist X1 and demo'd static sitting balance, fair, using right UE for facilitated support in static sitting.    Pt completed total assist sit<>stand with trace quad extension at R-LE and good performance with anterior lean during total assist left lateral transfer to w/c.   Pt attempted grasping therapist around waist when cued during transfer with his R-UE but was unable to sustain grasp or incorporate L-UE this session.   Pt left in tilt-in-space w/c at end of session with RN tech assisting with self-feeding task.     Therapy  Documentation Precautions:  Precautions Precautions: Cervical;Fall Precaution Comments: discussed log roll for cervical precautions Required Braces or Orthoses: Cervical Brace;Other Brace/Splint Cervical Brace: Hard collar;At all times Other Brace/Splint: don calf high ACE Wraps and Abd. binder Restrictions Weight Bearing Restrictions: No  Pain: Pain Assessment Pain Assessment: No/denies pain  See FIM for current functional status  Therapy/Group: Individual Therapy  [name] 10/02/2013, 9:55 AM

## 2013-10-02 NOTE — Treatment Plan (Signed)
Discussion with team and PT clinical specialist about initiation of stretching program for patient each morning to improve mobility and increase function for therapy sessions.  Zella RicherBridget S. Marquest Gunkel, PT, DPT
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
## 2013-10-02 NOTE — Progress Notes (Signed)
Speech Language Pathology Daily Session Note  Patient Details  Name: [name] Nickles MRN: [id number] Date of Birth: 1945/12/14  Today's Date: 10/02/2013 Time: 1105-1205 Time Calculation (min): 60 min  Short Term Goals: Week 1: SLP Short Term Goal 1 (Week 1): Pt will demonstrate correct use of call bell to request assistance SLP Short Term Goal 2 (Week 1): Pt will attend to tasks for 30 minutes with <2 cues for redirection. SLP Short Term Goal 3 (Week 1): Pt will solve semi-complex problem solving tasks with 75% accuracy, given min verbal cues. SLP Short Term Goal 4 (Week 1): Pt will utilize exernal aides to increase recall of new information  Skilled Therapeutic Interventions:  Pt was seen for skilled speech therapy targeting cognitive goals.  Pt was seated upright in wheelchair upon arrival, lethargic but agreeable to participate in ST.  SLP facilitated session with a structured medication management task targeting recall of new information with the use of external aids.  Pt required overall min-mod assist to recall function of newly scheduled medications for ~75% accuracy to complete written aid to facilitate carryover of new information.   Pt recalled at least 3 details of therapy schedule including goals and activities of therapy with min instructional cues.  Pt was noted with good awareness of decreased selective attention and spontaneously generated 2 compensatory strategies to minimize distractions during functional conversations.  Overall supervision level cues were used to redirect pt to structured tasks in the setting of fatigue.  Continue per current plan of care.    FIM:  Comprehension Comprehension Mode: Auditory Comprehension: 6-Follows complex conversation/direction: With extra time/assistive device Expression Expression Mode: Verbal Expression: 6-Expresses complex ideas: With extra time/assistive device Social Interaction Social Interaction: 5-Interacts appropriately 90% of the  time - Needs monitoring or encouragement for participation or interaction. Problem Solving Problem Solving: 4-Solves basic 75 - 89% of the time/requires cueing 10 - 24% of the time Memory Memory: 4-Recognizes or recalls 75 - 89% of the time/requires cueing 10 - 24% of the time FIM - Eating Eating Activity: 1: Helper feeds patient  Pain Pain Assessment Pain Assessment: No/denies pain  Therapy/Group: Individual Therapy  Jackalyn LombardNicole Lyrick Worland, M.A. CCC-SLP  Justyna Timoney, Melanee SpryNicole L 10/02/2013, 4:59 PM

## 2013-10-02 NOTE — Plan of Care (Signed)
Problem: SCI BLADDER ELIMINATION Goal: RH STG MANAGE BLADDER WITH ASSISTANCE STG Manage Bladder With Assistance maxx  Outcome: Not Progressing Patient requiring I/O caths due to inability to void  Goal: RH STG MANAGE BLADDER WITH MEDICATION WITH ASSISTANCE STG Manage Bladder With Medication With Assistance. maxx  Outcome: Not Progressing Patient requiring I/O caths due to inability to void
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
## 2013-10-03 ENCOUNTER — Inpatient Hospital Stay (HOSPITAL_COMMUNITY): Payer: 59 | Admitting: Speech Pathology

## 2013-10-03 ENCOUNTER — Inpatient Hospital Stay (HOSPITAL_COMMUNITY): Payer: 59

## 2013-10-03 DIAGNOSIS — IMO0002 Reserved for concepts with insufficient information to code with codable children: Secondary | ICD-10-CM

## 2013-10-03 DIAGNOSIS — D62 Acute posthemorrhagic anemia: Secondary | ICD-10-CM

## 2013-10-03 DIAGNOSIS — I10 Essential (primary) hypertension: Secondary | ICD-10-CM

## 2013-10-03 DIAGNOSIS — S069X9A Unspecified intracranial injury with loss of consciousness of unspecified duration, initial encounter: Secondary | ICD-10-CM

## 2013-10-03 DIAGNOSIS — S069XAA Unspecified intracranial injury with loss of consciousness status unknown, initial encounter: Secondary | ICD-10-CM

## 2013-10-03 LAB — URINALYSIS, ROUTINE W REFLEX MICROSCOPIC
Bilirubin Urine: NEGATIVE
Glucose, UA: NEGATIVE mg/dL
Ketones, ur: NEGATIVE mg/dL
Nitrite: POSITIVE — AB
Protein, ur: NEGATIVE mg/dL
Specific Gravity, Urine: 1.009 (ref 1.005–1.030)
Urobilinogen, UA: 0.2 mg/dL (ref 0.0–1.0)
pH: 5.5 (ref 5.0–8.0)

## 2013-10-03 LAB — GLUCOSE, CAPILLARY
Glucose-Capillary: 174 mg/dL — ABNORMAL HIGH (ref 70–99)
Glucose-Capillary: 171 mg/dL — ABNORMAL HIGH (ref 70–99)
Glucose-Capillary: 154 mg/dL — ABNORMAL HIGH (ref 70–99)
Glucose-Capillary: 138 mg/dL — ABNORMAL HIGH (ref 70–99)

## 2013-10-03 LAB — URINE MICROSCOPIC-ADD ON
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
## 2013-10-03 NOTE — Progress Notes (Signed)
Physical Therapy Session Note  Patient Details  Name: Arita MissCharlie Leclere MRN: [id number] Date of Birth: 09-11-1945  Today's Date: 10/03/2013 Time: Treatment Session 1: 1300-1400; Treatment Session 2: [id number] Time Calculation (min): Treatment Session 1: 60 min; Treatment Session 2: 60min  Short Term Goals: Week 1:  PT Short Term Goal 1 (Week 1): Pt will increased rolling R with side rail via log rolling and max to total A.  PT Short Term Goal 2 (Week 1): Pt will increase rolling L with side rail via log rolling and max A.  PT Short Term Goal 3 (Week 1): Pt will increase supine to edge of bed, edge of bed to supine with side rail and max A.  PT Short Term Goal 4 (Week 1): Pt will increase transfers to sliding board with total  A x 2.  PT Short Term Goal 5 (Week 1): Pt will increase w/c mobility to 25 feet with max A.   Skilled Therapeutic Interventions/Progress Updates:  Treatment Session 1:  1:1. Pt received sitting in w/c, ready for therapy. Focus this session on activity tolerance, bed mobility, pt directing care and stretching program.  Pt req maxi move and Ax2 persons for safe transfer w/c>bed. Emphasis on B rollingx3 for technique and sling removal. Pt able to consistently manage R LE flexion for improved ability to roll to L side w/ use of bed rail and max A x1person. Pt req total A for L LE flexion and Ax2 persons for rolling to R side, pt req assistance to maintain L UE grasp on bed rail. Emphasis on pt directing care for turning schedule and positioning for prevention of skin breakdown, pt verbalized understanding but will need reinforcement.   Rehab tech present to be instructed on B UE/LE stretching program to maintain pt's muscle extensibility for improved overall function. Therapist demonstrating on pt's R side, rehab tech providing appropriate return demonstration on pt's L side, handout provided to ensure consistency. Stretches performed to B UE/LE all joints all planes, 2x with  20-30second hold. B UE flexion limited to 90dec to accommodate for cervical precautions. Further emphasis on pt directing care for increased/lessend stretch based on tolerance.   Pt on 2LO2 throughout session to maintain SaO2 >90%. Pt left semi-recline in bed w/ all needs in reach.  Treatment Session 2:  1:1. Pt received semi-reclined in bed, ready for therapy. Focus this session on bed mobility, functional transfers and NMR in sitting. Pt reported immediate episode of urinary incontinence following transition semi-reclined>sup in hospital bed. Pt performed B rolling multiple times for clean up and sling placement, Ax2 persons for rolling R and max A x1person for rolling L. Use of maxi move with Ax2persons for safety for t/f bed>w/c. Pt req Ax2persons for SBT w/c<>tx mat, pt attempting to use R UE/LE as able but non-functional.   Pt with good tolerance to sitting on EOM x25' for focus on NMR. Pt req close(S) to maintain static sitting balance and min A to maintain dynamic sitting balance while reaching for horseshoes +/-midline inside BOS and tossing them w/ gross R UE grasp. Pt unable to grasp horseshoe with L UE at this time. Therapist providing manual facilitation L UE>R UE to reach for horseshoes with emphasis on slow and controlled movement vs. Quick and unsustained movement. Pt req intermittent cueing for head/trunk posture due to progressive overall flexion.     Pt able to maintain SaO2 >90% on RA throughout session. Pt left sitting in w/c with all needs in reach and quick  release belt in place. RN aware.   Therapy Documentation Precautions:  Precautions Precautions: Cervical;Fall Precaution Comments: discussed log roll for cervical precautions Required Braces or Orthoses: Cervical Brace;Other Brace/Splint Cervical Brace: Hard collar;At all times Other Brace/Splint: don calf high ACE Wraps and Abd. binder Restrictions Weight Bearing Restrictions: No Pain: Pain Assessment Pain  Assessment: No/denies pain  See FIM for current functional status  Therapy/Group: Individual Therapy  Denzil Hughes 10/03/2013, 5:28 PM

## 2013-10-03 NOTE — Progress Notes (Signed)
Occupational Therapy Session Note  Patient Details  Name: [name] MRN: [id number] Date of Birth: Sep 23, 1945  Today's Date: 10/03/2013 Time: [id number] Time Calculation (min): 30 min  Short Term Goals: Week 1:  OT Short Term Goal 1 (Week 1): Pt. will drink from cup with straw with mod assist OT Short Term Goal 2 (Week 1): Pt. will feed self with AE with max assist  OT Short Term Goal 3 (Week 1): Pt. will maintain static sitting balance for 10 minutes EOB OT Short Term Goal 4 (Week 1): Pt. will bath self with max assist  Skilled Therapeutic Interventions/Progress Updates:    Pt seen for 1:1 OT session with focus on increasing independence with self-feeding and grooming task. Provided turning schedule to pt to increase pt's participation and ability to direct care. Provided clock with times highlighted as well as position pt is to be on at that time. Placed schedule below clock and pt read schedule to ensure ability to see from bed. Provided universal cuff and practiced bringing spoon to mouth using repetition. Pt demonstrating good elbow flexion/extension with support at elbow. Pt limited by wrist movement as he is able to initiate of movements, however requires facilitation to complete/sustain movement to bring spoon to mouth. Pt eager to continue using cuff. Engaged in grooming task of washing face with support at elbow to complete. Pt left supine in bed with all needs in reach.   Therapy Documentation Precautions:  Precautions Precautions: Cervical;Fall Precaution Comments: discussed log roll for cervical precautions Required Braces or Orthoses: Cervical Brace;Other Brace/Splint Cervical Brace: Hard collar;At all times Other Brace/Splint: don calf high ACE Wraps and Abd. binder Restrictions Weight Bearing Restrictions: No General:   Vital Signs:   Pain: Pain Assessment Pain Assessment: No/denies pain Pain Score: 6  Pain Location: Neck Pain Orientation:  Proximal;Posterior Pain Descriptors / Indicators: Aching Pain Frequency: Intermittent Pain Onset: On-going Pain Intervention(s): Repositioned;Medication (See eMAR)   Other Treatments:    See FIM for current functional status  Therapy/Group: Individual Therapy  [name], [name] N 10/03/2013, 2:31 PM

## 2013-10-03 NOTE — Progress Notes (Signed)
Speech Language Pathology Daily Session Note  Patient Details  Name: [name] MRN: [id number] Date of Birth: 1945/12/28  Today's Date: 10/03/2013 Time: [id number] Time Calculation (min): 33 min  Short Term Goals: Week 1: SLP Short Term Goal 1 (Week 1): Pt will demonstrate correct use of call bell to request assistance SLP Short Term Goal 2 (Week 1): Pt will attend to tasks for 30 minutes with <2 cues for redirection. SLP Short Term Goal 3 (Week 1): Pt will solve semi-complex problem solving tasks with 75% accuracy, given min verbal cues. SLP Short Term Goal 4 (Week 1): Pt will utilize exernal aides to increase recall of new information  Skilled Therapeutic Interventions:  Pt was seen for skilled speech therapy targeting cognitive goals.  Upon arrival, pt was seated upright in wheelchair, lethargic but agreeable to participate in ST.  Pt recalled >75% of targeted medications from previous therapy session with min question cues and use of written aid.  Additionally, pt recalled at least 3 recommended compensatory strategies for attention from previous therapy sessions with supervision question cues.  Pt directed his care to meet needs/wants with min question cues.  Continue per current plan of care.    FIM:  Comprehension Comprehension Mode: Auditory Comprehension: 5-Understands complex 90% of the time/Cues < 10% of the time Expression Expression Mode: Verbal Expression: 5-Expresses complex 90% of the time/cues < 10% of the time Social Interaction Social Interaction: 6-Interacts appropriately with others with medication or extra time (anti-anxiety, antidepressant). Problem Solving Problem Solving: 4-Solves basic 75 - 89% of the time/requires cueing 10 - 24% of the time Memory Memory: 6-More than reasonable amt of time FIM - Eating Eating Activity: 1: Helper feeds patient  Pain Pain Assessment Pain Assessment: No/denies pain Pain Score: 6  Pain Location: Neck Pain Orientation:  Proximal;Posterior Pain Descriptors / Indicators: Aching Pain Frequency: Intermittent Pain Onset: On-going Pain Intervention(s): Repositioned;Medication (See eMAR)  Therapy/Group: Individual Therapy  Jackalyn LombardNicole Shwanda Soltis, M.A. CCC-SLP  Shaletha Humble, Melanee SpryNicole L 10/03/2013, 2:21 PM
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
## 2013-10-03 NOTE — Progress Notes (Signed)
Occupational Therapy Session Note  Patient Details  Name: [name] MRN: [id number] Date of Birth: 1945/07/02  Today's Date: 10/03/2013 Time: [id number] Time Calculation (min): 65 min  Short Term Goals: Week 1:  OT Short Term Goal 1 (Week 1): Pt. will drink from cup with straw with mod assist OT Short Term Goal 2 (Week 1): Pt. will feed self with AE with max assist  OT Short Term Goal 3 (Week 1): Pt. will maintain static sitting balance for 10 minutes EOB OT Short Term Goal 4 (Week 1): Pt. will bath self with max assist  Skilled Therapeutic Interventions/Progress Updates: ADL-retraining with focus on improved BUE function during assisted ADL, static sitting balance, transfers.   Pt received supine and awaiting planned bathing/dressing re-training with +2 assist.   LB B&D requires total assist although pt demo'd voluntary right hip external rotation and flexion when cued during assisted LE bathing and he attempted to wash peri-area when cued with wash cloth presented.   Pt required max assist to roll to side to side to don pants supine with good use of bed rails to support UE while side-lying. Pt washed upper body supine with HOB elevated, reaching chest, left UE and stomach this session.   Pt improved directing staff and self-monitoring of symptoms, requesting rest breaks appropriately during static sitting at edge of bed while assisted with upper body dressing.     Pt requires mech assist transfers although tolerates dependent lateral transfer to w/c with total assist to initiate and control transfer.      Therapy Documentation Precautions:  Precautions Precautions: Cervical;Fall Precaution Comments: discussed log roll for cervical precautions Required Braces or Orthoses: Cervical Brace;Other Brace/Splint Cervical Brace: Hard collar;At all times Other Brace/Splint: don calf high ACE Wraps and Abd. binder Restrictions Weight Bearing Restrictions: No  Vital Signs: Therapy Vitals Temp:  98.6 F (37 C) Temp src: Oral Pulse Rate: 70 Resp: 18 BP: 138/56 mmHg Patient Position (if appropriate): Lying Oxygen Therapy SpO2: 98 % O2 Device: Nasal cannula O2 Flow Rate (L/min): 3 L/min  Pain: Pain Assessment Pain Assessment: 0-10 Pain Score: 6  Pain Location: Neck Pain Orientation: Proximal;Posterior Pain Descriptors / Indicators: Aching Pain Frequency: Intermittent Pain Onset: On-going Pain Intervention(s): Repositioned;Medication (See eMAR)  See FIM for current functional status  Therapy/Group: Individual Therapy  Charna Neeb 10/03/2013, 10:49 AM

## 2013-10-03 NOTE — Progress Notes (Signed)
Patient required I&O cath x2 overnight after bladder scans q8h. I&O results are 650cc at 2200 and 700cc at 0600. Will bladder scan q6h to decrease I&O volume. Urine also cloudy and malodorous, will pass on to med team to check for possible UTI. Patient stable and understands purpose of I&O cath.

## 2013-10-03 NOTE — Progress Notes (Signed)
[facility] Individual Statement of Services  Patient Name:  [name]  Date:  10/03/2013  Welcome to the [facility].  Our goal is to provide you with an individualized program based on your diagnosis and situation, designed to meet your specific needs.  With this comprehensive rehabilitation program, you will be expected to participate in at least 3 hours of rehabilitation therapies Monday-Friday, with modified therapy programming on the weekends.  Your rehabilitation program will include the following services:  Physical Therapy (PT), Occupational Therapy (OT), Speech Therapy (ST), 24 hour per day rehabilitation nursing, Neuropsychology, Case Management (Social Worker), Rehabilitation Medicine, Nutrition Services and Pharmacy Services  Weekly team conferences will be held on Tuesdays to discuss your progress.  Your Social Worker will talk with you frequently to get your input and to update you on team discussions.  Team conferences with you and your family in attendance may also be held.  Expected length of stay:  28 to 30 days  Overall anticipated outcome:  Minimal Assistance  Depending on your progress and recovery, your program may change. Your Social Worker will coordinate services and will keep you informed of any changes. Your Social Worker's name and contact numbers are listed  below.  The following services may also be recommended but are not provided by the [facility]:   Driving Evaluations  Home Health Rehabiltiation Services  Outpatient Rehabilitation Services  Vocational Rehabilitation   Arrangements will be made to provide these services after discharge if needed.  Arrangements include referral to agencies that provide these services.  Your insurance has been verified to be:  [employer] Your primary doctor is:  Dr. Kari BaarsEdward [name]  Pertinent information will be shared with your doctor and your  insurance company.  Social Worker:  Staci AcostaJenny Harlee Eckroth, LCSW  [phone] or [phone]  Information discussed with and copy given to patient by: Elvera LennoxPrevatt, Mena Lienau Capps, 10/03/2013, 1:05 PM

## 2013-10-04 ENCOUNTER — Ambulatory Visit (HOSPITAL_COMMUNITY): Payer: 59 | Admitting: Speech Pathology

## 2013-10-04 ENCOUNTER — Inpatient Hospital Stay (HOSPITAL_COMMUNITY): Payer: 59

## 2013-10-04 ENCOUNTER — Encounter (HOSPITAL_COMMUNITY): Payer: 59 | Admitting: *Deleted

## 2013-10-04 ENCOUNTER — Encounter (HOSPITAL_COMMUNITY): Payer: 59

## 2013-10-04 ENCOUNTER — Inpatient Hospital Stay (HOSPITAL_COMMUNITY): Payer: 59 | Admitting: Physical Therapy

## 2013-10-04 LAB — GLUCOSE, CAPILLARY
Glucose-Capillary: 171 mg/dL — ABNORMAL HIGH (ref 70–99)
Glucose-Capillary: 164 mg/dL — ABNORMAL HIGH (ref 70–99)
Glucose-Capillary: 131 mg/dL — ABNORMAL HIGH (ref 70–99)

## 2013-10-04 NOTE — Progress Notes (Signed)
Physical Therapy Session Note  Patient Details  Name: [name] MRN: [id number] Date of Birth: 05/20/1945  Today's Date: 10/04/2013 Time: 1300-1400 Time Calculation (min): 60 min  Short Term Goals: Week 1:  PT Short Term Goal 1 (Week 1): Pt will increased rolling R with side rail via log rolling and max to total A.  PT Short Term Goal 2 (Week 1): Pt will increase rolling L with side rail via log rolling and max A.  PT Short Term Goal 3 (Week 1): Pt will increase supine to edge of bed, edge of bed to supine with side rail and max A.  PT Short Term Goal 4 (Week 1): Pt will increase transfers to sliding board with total  A x 2.  PT Short Term Goal 5 (Week 1): Pt will increase w/c mobility to 25 feet with max A.   Skilled Therapeutic Interventions/Progress Updates:    Session focused on upright tolerance, seated balance, functional mobility. Pt received supine in bed, agreeable to participate in therapy. Bed mobility rolling L/R to place sling w/ ModA to L, MaxA to R. TotalA to move bed>w/c w/ MaxiSky. Pt on RA throughout session, O2 remained >90%. Pt w/ MaxA to don green MaxiSky harness in therapy gym. Pt performed sliding board transfer w/ +2A w/c>mat to R. Pt was hooked into MaxiSky in case of LOB during transfer, however lift did not assist with transfer. Dynamic seated balance for leaning, weight shifting on edge of mat w/ assist level variable from Min-Max depending on fatigue and direction leaning (decreased assist with lean to R). Pt attempted to stand w/ use of MaxiSky lift, however green harness was uncomfortable against pt's cervical collar, unable to complete stand. Pt stood x1 w/ use of standing frame, pt complained that anterior chest pad was uncomfortable. Lowered harness to release pressure against anterior chest pad, pt reported it felt better. Pt able to maintain standing for ~30 seconds before needing to sit 2/2 fatigue. Pt w/ complaints of minor dizziness, O2 sats were stable,  unable to get BP after standing. TotalA w/ MaxiSky sling mat>w/c. Pt left seated in tilted w/c w/ all needs within reach.   Therapy Documentation Precautions:  Precautions Precautions: Cervical;Fall Precaution Comments: discussed log roll for cervical precautions Required Braces or Orthoses: Cervical Brace;Other Brace/Splint Cervical Brace: Hard collar;At all times Other Brace/Splint: don calf high ACE Wraps and Abd. binder Restrictions Weight Bearing Restrictions: No Vital Signs: Therapy Vitals Temp: 98.6 F (37 C) Temp src: Oral Pulse Rate: 65 Resp: 18 BP: 133/46 mmHg Patient Position (if appropriate): Lying Oxygen Therapy SpO2: 93 % O2 Device: None (Room air) Pain: Pain Assessment Pain Assessment: 0-10 (Simultaneous filing. User may not have seen previous data.) Pain Score: 7  (Simultaneous filing. User may not have seen previous data.) Pain Type: Acute pain (Simultaneous filing. User may not have seen previous data.) Pain Location: Neck (Simultaneous filing. User may not have seen previous data.) Pain Orientation: Posterior (Simultaneous filing. User may not have seen previous data.) Pain Descriptors / Indicators: Aching Pain Frequency: Intermittent Pain Onset: On-going (Simultaneous filing. User may not have seen previous data.) Patients Stated Pain Goal: 4 Pain Intervention(s): Medication (See eMAR) (Simultaneous filing. User may not have seen previous data.) Multiple Pain Sites: No  See FIM for current functional status  Therapy/Group: Individual Therapy  Hosie SpangleGodfrey, Mendy Chou Hosie SpangleJess Heavenly Christine, PT, DPT 10/04/2013, 2:40 PM

## 2013-10-04 NOTE — Progress Notes (Signed)
Patient information entered into [facility] system by Edson SnowballBecky Windsor, PT on 10/02/13 and reviewed by Tora DuckMarie Devinn Voshell, RN, CRRN, PPS Coordinator.  Information including medical coding and functional independence measure will be reviewed and updated through discharge.

## 2013-10-04 NOTE — Progress Notes (Signed)
Speech Language Pathology Daily Session Note  Patient Details  Name: [name] MRN: [id number] Date of Birth: 11-Oct-1945  Today's Date: 10/04/2013 Time: 0930-1030 Time Calculation (min): 60 min  Short Term Goals: Week 1: SLP Short Term Goal 1 (Week 1): Pt will demonstrate correct use of call bell to request assistance SLP Short Term Goal 2 (Week 1): Pt will attend to tasks for 30 minutes with <2 cues for redirection. SLP Short Term Goal 3 (Week 1): Pt will solve semi-complex problem solving tasks with 75% accuracy, given min verbal cues. SLP Short Term Goal 4 (Week 1): Pt will utilize exernal aides to increase recall of new information  Skilled Therapeutic Interventions: Skilled treatment session focused on addressing cognitive and self-care goals.  SLP facilitated session with discussion regarding rationale and importance of patient being able to direct care.  Patient reported feeling hesitant to direct care givers at times but feels more comfortable now. SLP also facilitate session with a selective attention task which patient required Min cues to attend to for ~25 minutes.  Recommend to continue with current plan of care.      FIM:  Comprehension Comprehension Mode: Auditory Comprehension: 5-Understands complex 90% of the time/Cues < 10% of the time Expression Expression Mode: Verbal Expression: 5-Expresses complex 90% of the time/cues < 10% of the time Social Interaction Social Interaction: 6-Interacts appropriately with others with medication or extra time (anti-anxiety, antidepressant). Problem Solving Problem Solving: 4-Solves basic 75 - 89% of the time/requires cueing 10 - 24% of the time Memory Memory: 5-Recognizes or recalls 90% of the time/requires cueing < 10% of the time  Pain Pain Assessment Pain Assessment: 0-10 Pain Score: 4  Pain Type: Acute pain Pain Location: Neck Pain Orientation: Posterior Pain Descriptors / Indicators: Aching Pain Frequency:  Intermittent Pain Onset: On-going Patients Stated Pain Goal: 4 Pain Intervention(s): RN made aware;Other (Comment) (premedicated) Multiple Pain Sites: No  Therapy/Group: Individual Therapy  [name], M.A., CCC-SLP [phone]  [name] 10/04/2013, 11:19 AM

## 2013-10-04 NOTE — Progress Notes (Signed)
Occupational Therapy Session Note  Patient Details  Name: [name] Nickles MRN: [id number] Date of Birth: [birth year]  Today's Date: 10/04/2013 Time: 1100-1207 Time Calculation (min): 67 min  Short Term Goals: Week 1:  OT Short Term Goal 1 (Week 1): Pt. will drink from cup with straw with mod assist OT Short Term Goal 2 (Week 1): Pt. will feed self with AE with max assist  OT Short Term Goal 3 (Week 1): Pt. will maintain static sitting balance for 10 minutes EOB OT Short Term Goal 4 (Week 1): Pt. will bath self with max assist  Skilled Therapeutic Interventions/Progress Updates:    Pt engaged in bathing and dressing tasks at bed level.  Focus on bed mobility, increased use of RUE to assist with bathing tasks, increased RLE movement to assist with rolling to left.  Pt able to raise right knee and push throught right foot to push hips to assist with rolling to right.  Pt initiates bringing RUE across body to grasp bed rail.  Pt required tot A to roll to left.  Pt used RUE to bathe abdomen and chest with assist to grasp wash cloth.  Pt tolerated therapy well. Pt transferred to tilt in space w/c via mechanical lift.  Soft call bell in place and pt able to activate.  Pt's wife entered at end of session and remained with patient.   Therapy Documentation Precautions:  Precautions Precautions: Cervical;Fall Precaution Comments: discussed log roll for cervical precautions Required Braces or Orthoses: Cervical Brace;Other Brace/Splint Cervical Brace: Hard collar;At all times Other Brace/Splint: don calf high ACE Wraps and Abd. binder Restrictions Weight Bearing Restrictions: No Pain: Pain Assessment Pain Assessment: 0-10 Pain Score: 4  Pain Type: Acute pain Pain Location: Neck Pain Orientation: Posterior Pain Descriptors / Indicators: Aching Pain Frequency: Intermittent Pain Onset: On-going Patients Stated Pain Goal: 4 Pain Intervention(s): RN made aware;Repositioned Multiple Pain Sites:  No  See FIM for current functional status  Therapy/Group: Individual Therapy  Rich BraveLanier, Torres Hardenbrook Chappell 10/04/2013, 12:19 PM

## 2013-10-04 NOTE — Progress Notes (Signed)
Physical Therapy Session Note  Patient Details  Name: [name] MRN: [id number] Date of Birth: 01/31/1946  Today's Date: 10/04/2013 Time: 1300-1400 Time Calculation (min): 60 min  Short Term Goals: Week 1:  PT Short Term Goal 1 (Week 1): Pt will increased rolling R with side rail via log rolling and max to total A.  PT Short Term Goal 2 (Week 1): Pt will increase rolling L with side rail via log rolling and max A.  PT Short Term Goal 3 (Week 1): Pt will increase supine to edge of bed, edge of bed to supine with side rail and max A.  PT Short Term Goal 4 (Week 1): Pt will increase transfers to sliding board with total  A x 2.  PT Short Term Goal 5 (Week 1): Pt will increase w/c mobility to 25 feet with max A.   Skilled Therapeutic Interventions/Progress Updates:    Neuromuscular Reeducation: PT instructs pt in seated static and dynamic balance activity in w/c req min A for static sit balance and min-mod A for dynamic balance with reaching activity x 5 reps in each direction. PT brings pt to standing frame and slowly raises pt up in frame, monitoring blood pressure every few minutes and pt achieves > 50% towards full stand. Pt c/o R knee pain in standing frame (arthritic knee) and so pt is returned back to sitting.   Therapeutic Exercise: PT instructs pt in heel slides in bed to B LE x 10 reps each: AAROM L hip flexion, min resisted L hip extension, AROM R hip flexion, min-mod resisted hip extension.   Therapeutic Activity: PT instructs pt in scoot transfer w/c to bed with slideboard req 2 person assist: min x 1 and min-mod x 1, with verbal cues to lean forward and to the L (hips traveling to the R). PT instructs pt in sit to R sidelie req 2 person assist, 1 at trunk and 1 for B legs. PT instructs pt in rolling R/L in bed req mod A to L and max A to R with rail.   Pt is able to participate well with scoot transfer and bed mobility; legs have fair strength to push (glutes), but R side is  stronger than L in UE and LE. Pt's blood pressure was well maintained with abdominal binder and B TED hose during standing frame activity; pt achieved > 50% stance in standing frame. Pt positioned in partial L side lie, per turning schedule in room, propped with pillows and call light attached to his binder and in reach. Wife entered as session was over.    Therapy Documentation Precautions:  Precautions Precautions: Cervical;Fall Precaution Comments: discussed log roll for cervical precautions Required Braces or Orthoses: Cervical Brace;Other Brace/Splint Cervical Brace: Hard collar;At all times Other Brace/Splint: don calf high ACE Wraps and Abd. binder Restrictions Weight Bearing Restrictions: No    Vital Signs: Therapy Vitals Pulse Rate: 84 BP: 129/73 mmHg Patient Position (if appropriate): Sitting Oxygen Therapy SpO2: 94 % O2 Device: None (Room air) Pain: Pain Assessment Pain Assessment: 0-10 (Simultaneous filing. User may not have seen previous data.) Pain Score: 7  (Simultaneous filing. User may not have seen previous data.) Pain Type: Acute pain (Simultaneous filing. User may not have seen previous data.) Pain Location: Neck (Simultaneous filing. User may not have seen previous data.) Pain Orientation: Posterior (Simultaneous filing. User may not have seen previous data.) Pain Descriptors / Indicators: Aching Pain Frequency: Intermittent Pain Onset: On-going (Simultaneous filing. User may not have seen  previous data.) Patients Stated Pain Goal: 4 Pain Intervention(s): Medication (See eMAR) (Simultaneous filing. User may not have seen previous data.) Multiple Pain Sites: No  See FIM for current functional status  Therapy/Group: Co-Treatment Carlena Sax [place]ble as a +2  Krishana Lutze M 10/04/2013, 1:07 PM
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
## 2013-10-05 ENCOUNTER — Inpatient Hospital Stay (HOSPITAL_COMMUNITY): Payer: 59

## 2013-10-05 ENCOUNTER — Encounter (HOSPITAL_COMMUNITY): Payer: 59 | Admitting: *Deleted

## 2013-10-05 ENCOUNTER — Inpatient Hospital Stay (HOSPITAL_COMMUNITY): Payer: 59 | Admitting: *Deleted

## 2013-10-05 ENCOUNTER — Inpatient Hospital Stay (HOSPITAL_COMMUNITY): Payer: 59 | Admitting: Speech Pathology

## 2013-10-05 DIAGNOSIS — S069X9A Unspecified intracranial injury with loss of consciousness of unspecified duration, initial encounter: Secondary | ICD-10-CM

## 2013-10-05 DIAGNOSIS — I10 Essential (primary) hypertension: Secondary | ICD-10-CM

## 2013-10-05 DIAGNOSIS — S069XAA Unspecified intracranial injury with loss of consciousness status unknown, initial encounter: Secondary | ICD-10-CM

## 2013-10-05 DIAGNOSIS — D62 Acute posthemorrhagic anemia: Secondary | ICD-10-CM

## 2013-10-05 DIAGNOSIS — IMO0002 Reserved for concepts with insufficient information to code with codable children: Secondary | ICD-10-CM

## 2013-10-05 LAB — GLUCOSE, CAPILLARY
Glucose-Capillary: 148 mg/dL — ABNORMAL HIGH (ref 70–99)
Glucose-Capillary: 142 mg/dL — ABNORMAL HIGH (ref 70–99)
Glucose-Capillary: 154 mg/dL — ABNORMAL HIGH (ref 70–99)
Glucose-Capillary: 150 mg/dL — ABNORMAL HIGH (ref 70–99)

## 2013-10-05 MED ORDER — CIPROFLOXACIN HCL 250 MG PO TABS
250.0000 mg | ORAL_TABLET | Freq: Two times a day (BID) | ORAL | Status: DC
  Administered 2013-10-05 – 2013-10-06 (×4): 250 mg via ORAL
  Filled 2013-10-05 (×7): qty 1

## 2013-10-05 MED ORDER — SENNA 8.6 MG PO TABS
2.0000 | ORAL_TABLET | Freq: Every day | ORAL | Status: DC
  Administered 2013-10-05: 17.2 mg via ORAL
  Filled 2013-10-05 (×2): qty 2

## 2013-10-05 NOTE — Progress Notes (Signed)
Social Work Patient ID: [name], male   DOB: February 10, 1946, 68 y.o.   MRN: [id number]  CSW met with pt and his wife to update them on team conference.  They are pleased to know when pt's targeted d/c date and are hopeful that pt will have less fatigue and will just keep progressing.  CSW shared with pt how impressed team is with his motivation.  Pt remains positive and appreciative of CIR care.  No current needs/concerns/questions at this time.  CSW will continue to follow and assist as needed.

## 2013-10-05 NOTE — Progress Notes (Signed)
Occupational Therapy Session Note  Patient Details  Name: [name] MRN: [id number] Date of Birth: [birth year]  Today's Date: 10/05/2013 Time: 0900-1010 Time Calculation (min): 70 min  Short Term Goals: Week 1:  OT Short Term Goal 1 (Week 1): Pt. will drink from cup with straw with mod assist OT Short Term Goal 2 (Week 1): Pt. will feed self with AE with max assist  OT Short Term Goal 3 (Week 1): Pt. will maintain static sitting balance for 10 minutes EOB OT Short Term Goal 4 (Week 1): Pt. will bath self with max assist  Skilled Therapeutic Interventions/Progress Updates: ADL-retraining with focus on improved performance with self-care using AE, static sitting balance, bed mobility, functional use of R-UE, re-ed on ROM for fingers (right hand), and slide board transfer.   Pt completed supine bathing with only assist of 1 this session d/t improved performance with rolling to left, enhanced by improved right LE flexion/extension and trunk rotation.   Pt able to shift his hips independently when cued to roll to left or right but lacks left U/LE strength to reach across his body to turn and roll to his right.   Pt required total assist to rise from supine to sitting at edge of bed but he held his balance well and performed right lateral lean with steadying assist in prep for total assist slide board transfer.   Patient completed upper body dressing with max assist, lifting right arm and lacing it through arm hole when cued, lifting left arm high enough to allow assist.   Pt received initial instruction on functional hand exercises and was left in tilt-in-space w/c with call light accessible and attached to his shirt.    Therapy Documentation Precautions:  Precautions Precautions: Cervical;Fall Precaution Comments: discussed log roll for cervical precautions Required Braces or Orthoses: Cervical Brace;Other Brace/Splint Cervical Brace: Hard collar;At all times Other Brace/Splint: don calf high  ACE Wraps and Abd. binder Restrictions Weight Bearing Restrictions: No  Pain: Pain Assessment Pain Assessment: No/denies pain Pain Score: 8  Pain Location: Shoulder (Left shoulder) Pain Orientation: Posterior  See FIM for current functional status  Therapy/Group: Individual Therapy  Second session: Time: 1400-1430 (OT; co-tx with PT, 1400-1510) Time Calculation (min):  30 min (70 min total time)  Pain Assessment: 6/10, left shoulder with activity; repositioned  Skilled Therapeutic Interventions: Co-Treatment with physical therapy to emphasize improved activity tolerance, slide board transfers, dynamic sitting and standing balance, weight-shifting (lateral, anterior/posterior leans), functional use of BUE.   OT provided family training with spouse on use of orthotics and AE during self-feeding with demonstration and setup of wrist support w/universal cuff and hand-based cuff to hold utensils.   Pt reported poor appetite but demonstrated potential for use of AE during part of self-feeding.   Spouse confirms that she currently works as a LawyerCNA and is familiar with use of devices and will incorporate.    [facility]" and performed assisted slide board transfer to bariatric drop-arm commode with +2 assist for safety.   Pt demo'd ability to complete both lateral and anterior/posterior leans when cued (pt = 50% of leans).   Pt was able to void only urine during attempt and returned to w/c using similar transfer in prep for continued physical therapy treatment in gym.   See FIM for current functional status  Therapy/Group: Individual Therapy  Joyleen Haselton 10/05/2013, 10:17 AM

## 2013-10-05 NOTE — Progress Notes (Signed)
Follow up skin assessment performed today, pt has healing stage 2 pressure ulcers on bilateral buttocks, 3 on the right and 1 on the left, all 100% red, shallow, Allevyn dressing intact , continue plan of care. Roberts-VonCannon, Bahar Shelden Elon JesterMichele

## 2013-10-05 NOTE — Progress Notes (Signed)
Physical Therapy Session Note  Patient Details  Name: [name] MRN: [id number] Date of Birth: Aug 18, 1945  Today's Date: 10/05/2013 Time: 1430-1505 Time Calculation (min): 35 min  Short Term Goals: Week 1:  PT Short Term Goal 1 (Week 1): Pt will increased rolling R with side rail via log rolling and max to total A.  PT Short Term Goal 2 (Week 1): Pt will increase rolling L with side rail via log rolling and max A.  PT Short Term Goal 3 (Week 1): Pt will increase supine to edge of bed, edge of bed to supine with side rail and max A.  PT Short Term Goal 4 (Week 1): Pt will increase transfers to sliding board with total  A x 2.  PT Short Term Goal 5 (Week 1): Pt will increase w/c mobility to 25 feet with max A.   Skilled Therapeutic Interventions/Progress Updates:  Co-treatment with OT for skilled transfers and therapeutic activities.  Standing frame x 2, 1.5-2 minutes each trial, with manual and VCs for wt shifting L><R, trunk flexion/extension, use of R hand for grasping cones and moving across midline, hip and trunk activation to assist with sit>< stand as static stander lifted and lowered.  Pt counted out loud during activities in standing, and was able to increase vocal intensity with cues.  Pt c/o wooziness when standing, but was able to tolerate a few seconds longer each time if cued to wt shift for LE muscle activation.  Pt left in room sitting upright in w/c, quick release belt on for positioning, all needs within reach, and visitor in room.    Therapy Documentation Precautions:  Precautions Precautions: Cervical;Fall Precaution Comments: discussed log roll for cervical precautions Required Braces or Orthoses: Cervical Brace;Other Brace/Splint Cervical Brace: Hard collar;At all times Other Brace/Splint: don calf high ACE Wraps and Abd. binder Restrictions Weight Bearing Restrictions: No   Pain: Pain Assessment Pain Assessment: No/denies pain      See FIM for current  functional status  Therapy/Group: Co-Treatment  Nadean Montanaro 10/05/2013, 3:55 PM
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
## 2013-10-05 NOTE — Progress Notes (Signed)
Speech Language Pathology Daily Session Note  Patient Details  Name: [name] MRN: [id number] Date of Birth: Jul 23, 1945  Today's Date: 10/05/2013 Time: 1105-1205 Time Calculation (min): 60 min  Short Term Goals: Week 1: SLP Short Term Goal 1 (Week 1): Pt will demonstrate correct use of call bell to request assistance SLP Short Term Goal 2 (Week 1): Pt will attend to tasks for 30 minutes with <2 cues for redirection. SLP Short Term Goal 3 (Week 1): Pt will solve semi-complex problem solving tasks with 75% accuracy, given min verbal cues. SLP Short Term Goal 4 (Week 1): Pt will utilize exernal aides to increase recall of new information  Skilled Therapeutic Interventions:  Pt was seen for skilled speech therapy targeting cognitive linguistic goals.  Upon arrival, pt was seated upright in wheelchair, lethargic but pleasantly interactive and agreeable to participate in ST.  SLP facilitated the session with functional questions related to pt's turning schedule with pt able to recall at least 3 parameters for pressure relief and rationale behind turning schedule with min question cues and use of visual aids posted in room by PT.  Additionally, SLP engaged pt in a semi-complex generative naming task targeting problem solving and selective attention in a moderately distracting environment with pt requiring min assist faded to supervision cues to complete secondary to decreased working memory.  Pt attended to task, despite ambient noise and environmental distractions for >30 minutes with modified independence.  Throughout session, SLP reinforced recommendations and rationale for pt directing his care with pt initiating requests to meet needs/wants with overall supervision question cues.  Continue per current plan of care.    FIM:  Comprehension Comprehension: 6-Follows complex conversation/direction: With extra time/assistive device Expression Expression Mode: Verbal Expression: 6-Expresses complex  ideas: With extra time/assistive device Social Interaction Social Interaction: 7-Interacts appropriately with others - No medications needed. Problem Solving Problem Solving: 4-Solves basic 75 - 89% of the time/requires cueing 10 - 24% of the time Memory Memory: 5-Recognizes or recalls 90% of the time/requires cueing < 10% of the time  Pain Pain Assessment Pain Assessment: No/denies pain   Therapy/Group: Individual Therapy  Jackalyn LombardNicole Craig Wisnewski, M.A. CCC-SLP  [name], Melanee SpryNicole [name] 10/05/2013, 2:39 PM
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
## 2013-10-06 ENCOUNTER — Encounter (HOSPITAL_COMMUNITY): Payer: 59 | Admitting: *Deleted

## 2013-10-06 ENCOUNTER — Inpatient Hospital Stay (HOSPITAL_COMMUNITY): Payer: 59 | Admitting: Speech Pathology

## 2013-10-06 ENCOUNTER — Inpatient Hospital Stay (HOSPITAL_COMMUNITY): Payer: 59 | Admitting: Occupational Therapy

## 2013-10-06 ENCOUNTER — Inpatient Hospital Stay (HOSPITAL_COMMUNITY): Payer: 59 | Admitting: Physical Therapy

## 2013-10-06 ENCOUNTER — Encounter (HOSPITAL_COMMUNITY): Payer: 59

## 2013-10-06 LAB — GLUCOSE, CAPILLARY
Glucose-Capillary: 149 mg/dL — ABNORMAL HIGH (ref 70–99)
Glucose-Capillary: 164 mg/dL — ABNORMAL HIGH (ref 70–99)
Glucose-Capillary: 129 mg/dL — ABNORMAL HIGH (ref 70–99)
Glucose-Capillary: 138 mg/dL — ABNORMAL HIGH (ref 70–99)

## 2013-10-06 LAB — CREATININE, SERUM
Creatinine, Ser: 1.1 mg/dL (ref 0.50–1.35)
GFR calc Af Amer: 79 mL/min — ABNORMAL LOW (ref >90)
GFR calc non Af Amer: 68 mL/min — ABNORMAL LOW (ref >90)

## 2013-10-06 MED ORDER — SENNA 8.6 MG PO TABS
2.0000 | ORAL_TABLET | Freq: Two times a day (BID) | ORAL | Status: DC
  Administered 2013-10-06: 17.2 mg via ORAL
  Filled 2013-10-06: qty 2

## 2013-10-06 MED ORDER — CHLORHEXIDINE GLUCONATE 0.12 % MT SOLN
10.0000 mL | Freq: Four times a day (QID) | OROMUCOSAL | Status: DC
Start: 2013-10-06 — End: 2013-10-27
  Administered 2013-10-06 – 2013-10-27 (×78): 10 mL via OROMUCOSAL
  Filled 2013-10-06 (×88): qty 15

## 2013-10-06 MED ORDER — BISACODYL 10 MG RE SUPP
10.0000 mg | Freq: Every day | RECTAL | Status: DC
  Administered 2013-10-07 – 2013-10-27 (×19): 10 mg via RECTAL
  Filled 2013-10-06 (×20): qty 1

## 2013-10-06 MED ORDER — SENNOSIDES-DOCUSATE SODIUM 8.6-50 MG PO TABS
2.0000 | ORAL_TABLET | Freq: Every day | ORAL | Status: DC
  Administered 2013-10-06 – 2013-10-12 (×7): 2 via ORAL
  Filled 2013-10-06 (×6): qty 2

## 2013-10-06 MED ORDER — FLEET ENEMA 7-19 GM/118ML RE ENEM
1.0000 | ENEMA | Freq: Every day | RECTAL | Status: DC | PRN
  Administered 2013-10-06: 1 via RECTAL
  Filled 2013-10-06: qty 1
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
## 2013-10-06 NOTE — Progress Notes (Signed)
Occupational Therapy Session Note  Patient Details  Name: [name] MRN: [id number] Date of Birth: Apr 28, 1945  Today's Date: 10/06/2013 Time: 9562-13081329-1429 Time Calculation (min): 60 min  Short Term Goals: Week 1:  OT Short Term Goal 1 (Week 1): Pt. will drink from cup with straw with mod assist OT Short Term Goal 2 (Week 1): Pt. will feed self with AE with max assist  OT Short Term Goal 3 (Week 1): Pt. will maintain static sitting balance for 10 minutes EOB OT Short Term Goal 4 (Week 1): Pt. will bath self with max assist  Skilled Therapeutic Interventions/Progress Updates:    Engaged in therapeutic activity with focus on rolling, sitting, and trunk rotation to increase participation in self-care tasks. Pt in bed upon arrival, engaged in rolling in bed to place sling for lift with pt with increased initiation with rolling with assist of 1.  Performed slide board transfer from w/c to therapy mat with +2 for safety, noted pt with increased trunk control with anterior pelvic tilt to assist in transfer.  Static sitting on edge of mat >10 mins with no LOB during stretching progressing to reaching activity.  Weight bearing through LUE in sitting while reaching across midline with RUE to facilitate active stretch as well as trunk rotation as needed for transfers and self-care tasks.  Pt reports increased fatigue during session, discussed medication can impact fatigue levels.    Therapy Documentation Precautions:  Precautions Precautions: Cervical;Fall Precaution Comments: discussed log roll for cervical precautions Required Braces or Orthoses: Cervical Brace;Other Brace/Splint Cervical Brace: Hard collar;At all times Other Brace/Splint: don calf high ACE Wraps and Abd. binder Restrictions Weight Bearing Restrictions: No General:   Vital Signs: Therapy Vitals Pulse Rate: 77 BP: 127/67 mmHg Patient Position (if appropriate): Sitting Oxygen Therapy SpO2: 94 % O2 Device: None (Room  air) Pain: Pain Assessment Pain Assessment: 0-10 Pain Score: 5  Pain Type: Neuropathic pain Pain Location: Shoulder Pain Orientation: Left Pain Onset: On-going Pain Intervention(s): Medication (See eMAR) Multiple Pain Sites: No  See FIM for current functional status  Therapy/Group: Individual Therapy  Rosalio LoudHOXIE, Naiara Lombardozzi 10/06/2013, 3:43 PM

## 2013-10-06 NOTE — Progress Notes (Signed)
Physical Therapy Session Note  Patient Details  Name: [name] MRN: [id number] Date of Birth: 1946-02-05  Today's Date: 10/06/2013 Time:  - 1600-1700  Time Calculation: 60 min  Short Term Goals: Week 1:  PT Short Term Goal 1 (Week 1): Pt will increased rolling R with side rail via log rolling and max to total A.  PT Short Term Goal 2 (Week 1): Pt will increase rolling L with side rail via log rolling and max A.  PT Short Term Goal 3 (Week 1): Pt will increase supine to edge of bed, edge of bed to supine with side rail and max A.  PT Short Term Goal 4 (Week 1): Pt will increase transfers to sliding board with total  A x 2.  PT Short Term Goal 5 (Week 1): Pt will increase w/c mobility to 25 feet with max A.   Skilled Therapeutic Interventions/Progress Updates:    Patient received sitting in wheelchair. Session focused on functional transfers, postural control and trunk/core NMR in sitting, core activation, and wheelchair adjustments. Slideboard transfers with +2 assist. Sitting EOM, emphasis on upright posture, scap retraction, anterior weight shift, sniffing to facilitate core activation, decreasing UE support, decreasing BOS with LE movements. Wheelchair adjustments: lengthened leg rests and adjusted head rest posteriorly. Patient left sitting in wheelchair to eat dinned, all needs within reach and nurse techn notified of patient status.  Therapy Documentation Precautions:  Precautions Precautions: Cervical;Fall Precaution Comments: discussed log roll for cervical precautions Required Braces or Orthoses: Cervical Brace;Other Brace/Splint Cervical Brace: Hard collar;At all times Other Brace/Splint: don calf high ACE Wraps and Abd. binder Restrictions Weight Bearing Restrictions: No Vital Signs: Therapy Vitals Pulse Rate: 76 Oxygen Therapy SpO2: 96 % O2 Device: None (Room air) Pain:  No/denies pain  See FIM for current functional status  Therapy/Group: Individual  Therapy  Chipper HerbBridget S Shandi Godfrey S. Zayra Devito, PT, DPT 10/06/2013, 7:55 AM

## 2013-10-06 NOTE — Progress Notes (Signed)
Physical Therapy Session Note  Patient Details  Name: [name] MRN: [id number] Date of Birth: 08-Nov-1945  Today's Date: 10/06/2013 Time: 1430-1530 Time Calculation (min): 60 min  Short Term Goals: Week 1:  PT Short Term Goal 1 (Week 1): Pt will increased rolling R with side rail via log rolling and max to total A.  PT Short Term Goal 2 (Week 1): Pt will increase rolling L with side rail via log rolling and max A.  PT Short Term Goal 3 (Week 1): Pt will increase supine to edge of bed, edge of bed to supine with side rail and max A.  PT Short Term Goal 4 (Week 1): Pt will increase transfers to sliding board with total  A x 2.  PT Short Term Goal 5 (Week 1): Pt will increase w/c mobility to 25 feet with max A.   Skilled Therapeutic Interventions/Progress Updates:    Therapeutic Activity: PT instructs pt in scoot transfer w/c to mat to L with slideboard req mod A and verbal cues for head-hips relationship and hand placement. Pt req max A for sit to/from supine transfer via modified long sit. PT instructs pt in scoot transfer mat to w/c to R with slideboard req max A and verbal cues for head-hips relationship and hand placement. PT instructs pt in scoot transfer from w/c to bed with slideboard to L req mod-max A and sit to supine in bed req max A. Pt req 2 person max A to scoot up in bed.   Neuromuscular Reeducation: PT instructs pt in abdominal strengthening exercise with B LEs in hook lie - PT applying slowly graded increase/decrease pressure in diagonals at B knees x 10 reps each diagonal with verbal cues for pt to keep knees still in the middle. PT could visualize abdominal muscles contracting, but notes that L LE is easier to move than R LE.   Therapeutic Exercise: PT instructs pt in seated R march, AAROM seated L march, min resisted R heel slides in each direction, AAROM L hip/knee flexion and min resisted L hip/knee extension during heel slides.   Pt is progressing in terms of  functional mobility. Pt c/o dizziness while seated EOM after seated march exercise, and so PT assisted pt in lying supine and pt reports dizziness abolished. No time for PT to take vitals during that transition before symptoms abolished. Pt will benefit from continued B LE strengthening, dynamic balance reeducation, progression to standing and ambulation as is safe.   Therapy Documentation Precautions:  Precautions Precautions: Cervical;Fall Precaution Comments: discussed log roll for cervical precautions Required Braces or Orthoses: Cervical Brace;Other Brace/Splint Cervical Brace: Hard collar;At all times Other Brace/Splint: don calf high ACE Wraps and Abd. binder Restrictions Weight Bearing Restrictions: No    Vital Signs: Therapy Vitals Pulse Rate: 77 BP: 127/67 mmHg Patient Position (if appropriate): Sitting Oxygen Therapy SpO2: 94 % O2 Device: None (Room air) Pain: Pain Assessment Pain Assessment: 0-10 Pain Score: 7  Pain Type: Neuropathic pain Pain Location: Shoulder Pain Orientation: Left Pain Onset: On-going Pain Intervention(s): Rest;Repositioned Multiple Pain Sites: No  See FIM for current functional status  Therapy/Group: Individual Therapy  Soliana Kitko M 10/06/2013, 2:42 PM

## 2013-10-06 NOTE — Progress Notes (Signed)
Patient is reporting dental pain as well as difficulty eating due to broken tooth.  Exam reveals missing tooth LLQ-- # 18 (2nd to last) as well as jagged edges of ? # 17.  Peridex rinse added qid past meals. Patient instructed on good oral care as well as avoiding hot/cold foods and soft foods. He is already on antibiotics. Dr. Kristin BruinsKulinski consulted for input and orthopantogram ordered for work up per request.

## 2013-10-06 NOTE — Progress Notes (Signed)
Occupational Therapy Session Note  Patient Details  Name: [name] MRN: [id number] Date of Birth: [birth year]  Today's Date: 10/06/2013 Time: 1100-1200 Time Calculation (min): 60 min  Short Term Goals: Week 1:  OT Short Term Goal 1 (Week 1): Pt. will drink from cup with straw with mod assist OT Short Term Goal 2 (Week 1): Pt. will feed self with AE with max assist  OT Short Term Goal 3 (Week 1): Pt. will maintain static sitting balance for 10 minutes EOB OT Short Term Goal 4 (Week 1): Pt. will bath self with max assist  Skilled Therapeutic Interventions/Progress Updates:    Pt engaged in bathing and dressing at bed level.  Focus on increased BUE use to assist, bed mobility, increased BLE use, and directing care.  Pt exhibits increased use of RUE to assist with bathing tasks with bath mit donned.  Pt exhibits increased active movement at elbow with LUE but not using functionally at this time.  Pt rolls to left using bed rails with min A but requires max A to roll to right.    Therapy Documentation Precautions:  Precautions Precautions: Cervical;Fall Precaution Comments: discussed log roll for cervical precautions Required Braces or Orthoses: Cervical Brace;Other Brace/Splint Cervical Brace: Hard collar;At all times Other Brace/Splint: don calf high ACE Wraps and Abd. binder Restrictions Weight Bearing Restrictions: No   Pain: Pain Assessment Pain Assessment: No/denies pain Pain Score: 2  Pain Location: Shoulder Pain Orientation: Left Pain Descriptors / Indicators: Aching Pain Intervention(s): Medication (See eMAR)  See FIM for current functional status  Therapy/Group: Individual Therapy  [name], [name] 10/06/2013, 12:11 PM
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
## 2013-10-07 ENCOUNTER — Inpatient Hospital Stay (HOSPITAL_COMMUNITY): Payer: 59 | Admitting: Physical Therapy

## 2013-10-07 ENCOUNTER — Inpatient Hospital Stay (HOSPITAL_COMMUNITY): Payer: 59 | Admitting: Speech Pathology

## 2013-10-07 DIAGNOSIS — S0100XA Unspecified open wound of scalp, initial encounter: Secondary | ICD-10-CM

## 2013-10-07 DIAGNOSIS — F05 Delirium due to known physiological condition: Secondary | ICD-10-CM

## 2013-10-07 DIAGNOSIS — Z5189 Encounter for other specified aftercare: Secondary | ICD-10-CM

## 2013-10-07 DIAGNOSIS — IMO0002 Reserved for concepts with insufficient information to code with codable children: Secondary | ICD-10-CM

## 2013-10-07 DIAGNOSIS — I10 Essential (primary) hypertension: Secondary | ICD-10-CM

## 2013-10-07 LAB — GLUCOSE, CAPILLARY
Glucose-Capillary: 111 mg/dL — ABNORMAL HIGH (ref 70–99)
Glucose-Capillary: 114 mg/dL — ABNORMAL HIGH (ref 70–99)
Glucose-Capillary: 118 mg/dL — ABNORMAL HIGH (ref 70–99)
Glucose-Capillary: 128 mg/dL — ABNORMAL HIGH (ref 70–99)
Glucose-Capillary: 172 mg/dL — ABNORMAL HIGH (ref 70–99)

## 2013-10-07 LAB — URINE CULTURE: Colony Count: >=100000

## 2013-10-07 MED ORDER — CEFUROXIME AXETIL 250 MG PO TABS
250.0000 mg | ORAL_TABLET | Freq: Two times a day (BID) | ORAL | Status: AC
  Administered 2013-10-08 – 2013-10-14 (×13): 250 mg via ORAL
  Filled 2013-10-07 (×14): qty 1

## 2013-10-07 MED ORDER — OXYCODONE HCL 5 MG PO TABS
5.0000 mg | ORAL_TABLET | Freq: Four times a day (QID) | ORAL | Status: DC | PRN
  Administered 2013-10-07 – 2013-10-08 (×3): 5 mg via ORAL
  Administered 2013-10-08: 10 mg via ORAL
  Administered 2013-10-08 – 2013-10-14 (×15): 5 mg via ORAL
  Administered 2013-10-15: 10 mg via ORAL
  Administered 2013-10-15 (×2): 5 mg via ORAL
  Administered 2013-10-16: 10 mg via ORAL
  Administered 2013-10-16 – 2013-10-21 (×14): 5 mg via ORAL
  Administered 2013-10-22: 10 mg via ORAL
  Administered 2013-10-22 (×2): 5 mg via ORAL
  Filled 2013-10-07 (×47): qty 1
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
## 2013-10-07 NOTE — Progress Notes (Signed)
Held Cipro this am no further sweating today .

## 2013-10-07 NOTE — Progress Notes (Signed)
Patient was sweaty throughout the first half of the shift, vitals stable, with b/p slightly elevated, Blood sugar 118, and I/O cath was completed. Pt states that he has not been sweaty like this before. Patient fell asleep around 230 till around 530. No signs of sweating this am and vitals stable. Continue to monitor. Kealie Barrie, Phill MutterMelissa Rebecca

## 2013-10-07 NOTE — Progress Notes (Signed)
Physical Therapy Weekly Progress Note  Patient Details  Name: Michaeal Davis MRN: [id number] Date of Birth: 1945-04-04  Beginning of progress report period: September 29, 2013 End of progress report period: October 07, 2013  Today's Date: 10/07/2013 Time: 0900-1000 Time Calculation (min): 60 min  Patient has met 4 of 5 short term goals.    Patient continues to demonstrate the following deficits:  Lsided weakness> R side weakness, impaired sitting and standing balance, difficulty with ambulation, blood pressure regulation issues, difficulty with bed mobility, impaired upright and activity tolerance and therefore will continue to benefit from skilled PT intervention to enhance overall performance with activity tolerance, balance, postural control, ability to compensate for deficits, functional use of  right upper extremity, right lower extremity, left upper extremity and left lower extremity and coordination.  Patient progressing toward long term goals..  Continue plan of care.  PT Short Term Goals Week 1:  PT Short Term Goal 1 (Week 1): Pt will increased rolling R with side rail via log rolling and max to total A.  PT Short Term Goal 1 - Progress (Week 1): Updated due to goal met PT Short Term Goal 2 (Week 1): Pt will increase rolling L with side rail via log rolling and max A.  PT Short Term Goal 2 - Progress (Week 1): Updated due to goal met PT Short Term Goal 3 (Week 1): Pt will increase supine to edge of bed, edge of bed to supine with side rail and max A.  PT Short Term Goal 3 - Progress (Week 1): Updated due to goal met PT Short Term Goal 4 (Week 1): Pt will increase transfers to sliding board with total  A x 2.  PT Short Term Goal 4 - Progress (Week 1): Updated due to goal met PT Short Term Goal 5 (Week 1): Pt will increase w/c mobility to 25 feet with max A.  PT Short Term Goal 5 - Progress (Week 1): Progressing toward goal  Skilled Therapeutic Interventions/Progress Updates:     Therapeutic Activity: PT instructs pt in rolling R in bed with rail req max A multiple times to don pants and change brief soiled with BM and don abdominal binder. PT instructs pt in rolling L in bed with rail req min A multiple times to don pants and change brief soiled with BM and don abdominal binder.  PT dons pt's B TED hose, as well as pants.  PT instructs pt in L side lie to/from sit with rail in bed req max A and verbal cues for hand placement. PT attempted to begin slideboard transfer, but pt began to have BM and so was assisted back to lying on side, when CNA assisted PT in changing pt's brief. Pt also req max A for R side lie to sit with rail in bed.  Once sitting edge of bed, pt req tot A to doff gown and don t-shirt, although pt attempts to assist.  PT instructs pt in scoot transfer bed to TIS w/c to L with slideboard req max A x 1 and SBA x 1 for safety, but no second assist needed. Pt req cues for head hips relationship, hand placement, and to push with B feet.    Pt's blood pressure was stable throughout session with abdominal binder and B TED hose on. Pt c/o initial dizziness upon sitting up on edge of bed, but this passed quickly, before PT could measure pt's BP. Pt left up in chair, tilted back, with tray table in  front and call button pinned to his shirt. Pt is progressing in BP management. PT was unable to attempt w/c management training today due to BM during session, but pt is excited to begin this. Pt is now ready to practice slideboard transfers daily and progress towards standing in frame and body weight supported treadmill training, as blood pressure is stable.    Therapy Documentation Precautions:  Precautions Precautions: Cervical;Fall Precaution Comments: discussed log roll for cervical precautions Required Braces or Orthoses: Cervical Brace;Other Brace/Splint Cervical Brace: Hard collar;At all times Other Brace/Splint: don calf high ACE Wraps and Abd.  binder Restrictions Weight Bearing Restrictions: No    Vital Signs: Therapy Vitals Pulse Rate: 92 (after activity) BP: 137/82 mmHg Patient Position (if appropriate): Sitting Oxygen Therapy SpO2: 94 % O2 Device: None (Room air) Pain: Pain Assessment Pain Assessment: 0-10 Pain Score: 6  Pain Type: Other (Comment) (from dig stim) Pain Location: Rectum Pain Descriptors / Indicators: Sore;Aching Pain Onset: Gradual Pain Intervention(s): Rest;Repositioned Multiple Pain Sites: No  See FIM for current functional status  Therapy/Group: Individual Therapy CNA provided stand by assist during slideboard transfer  [facility] M 10/07/2013, 9:40 AM

## 2013-10-07 NOTE — Progress Notes (Signed)
Speech Language Pathology Daily Session Note  Patient Details  Name: [name] MRN: [id number] Date of Birth: 10-May-1945  Today's Date: 10/07/2013 Time: 1045-1130 Time Calculation (min): 45 min  Short Term Goals: Week 2: SLP Short Term Goal 1 (Week 2): Pt will initiate functional communication to direct his care (i.e. communicating pressure relief needs, managing medications, and requesting assistance with feeding or other self care tasks) for 80% accuracy with supervision cues.  SLP Short Term Goal 2 (Week 2): Pt will recall semi-complex information related to management of spinal cord injury and therapy/medical recommendations for 80% accurcay with supervision.  SLP Short Term Goal 3 (Week 2): Pt will alternate attention during functional tasks for >30 minutes with modified indepedence.   Skilled Therapeutic Interventions: Skilled treatment session focused on addressing cognitive and self-care goals.  SLP facilitated session with a selective attention to a moderately complex task which patient required Supervision cues to attend to for ~25 minutes.  Patient with able to direct SLP and daughter to set-up room and provide things as needed with Supervision question cues for specific details.  Recommend to continue with current plan of care.      FIM:  Comprehension Comprehension Mode: Auditory Comprehension: 5-Understands complex 90% of the time/Cues < 10% of the time Expression Expression Mode: Verbal Expression: 5-Expresses complex 90% of the time/cues < 10% of the time Social Interaction Social Interaction: 6-Interacts appropriately with others with medication or extra time (anti-anxiety, antidepressant). Problem Solving Problem Solving: 4-Solves basic 75 - 89% of the time/requires cueing 10 - 24% of the time Memory Memory: 5-Recognizes or recalls 90% of the time/requires cueing < 10% of the time  Pain Pain Assessment Pain Assessment: No/denies pain  Therapy/Group: Individual  Therapy  Charlane FerrettiMelissa Mina Babula, M.A., CCC-SLP [phone]  [name] 10/07/2013, 1:09 PM

## 2013-10-07 NOTE — Progress Notes (Signed)
Social Work Patient ID: [name], male   DOB: January 30, 1946, 68 y.o.   MRN: [id number]  CSW met with pt on 10-06-13 to inform him that CSW would be out of the hospital next week and that [name], rest of the CSW team would follow pt.  Pt was appreciative of visit and had no current needs/concerns/questions at this time.  CSW team is available as needed should needs arise.

## 2013-10-08 ENCOUNTER — Inpatient Hospital Stay (HOSPITAL_COMMUNITY): Payer: 59

## 2013-10-08 DIAGNOSIS — D62 Acute posthemorrhagic anemia: Secondary | ICD-10-CM

## 2013-10-08 DIAGNOSIS — IMO0002 Reserved for concepts with insufficient information to code with codable children: Secondary | ICD-10-CM

## 2013-10-08 LAB — GLUCOSE, CAPILLARY
Glucose-Capillary: 137 mg/dL — ABNORMAL HIGH (ref 70–99)
Glucose-Capillary: 154 mg/dL — ABNORMAL HIGH (ref 70–99)
Glucose-Capillary: 136 mg/dL — ABNORMAL HIGH (ref 70–99)

## 2013-10-08 NOTE — Progress Notes (Signed)
Physical Therapy Session Note  Patient Details  Name: [name] MRN: [id number] Date of Birth: [birth year]  Today's Date: 10/08/2013 Time: 0900-1000 Time Calculation (min): 60 min  Short Term Goals: Week 2:  PT Short Term Goal 1 (Week 2): Pt will roll R in bed with side rail req mod A.  PT Short Term Goal 2 (Week 2): Pt will roll L in bed with siderail mod I.  PT Short Term Goal 3 (Week 2): Pt will demonstrates side lie to sit transfer req mod A.  PT Short Term Goal 4 (Week 2): Pt will scoot transfer bed to w/c with slideboard req mod A.  PT Short Term Goal 5 (Week 2): Pt will demonstrate w/c propulsion x 25' req max A.   Skilled Therapeutic Interventions/Progress Updates:    Session focused on functional mobility, w/c propulsion, upright tolerance. Pt received supine in bed, agreeable to participate in therapy. TotalA to don TED stockings, ModA to roll L and MaxA to roll R to don pants. Pt moved supine>sit w/ Mod-MaxA w/ strategy of supine>sidelying > prop on elbow. Pt tolerated seated EOB w/ feet supported for 10 minutes w/ no LOB, vital signs stable. Pt w/ MaxA sliding board transfer bed>standard w/c w/ max VC's for sequencing and pushing through BLE. Initiated w/c propulsion training w/ hemi-axle w/c. Pt w/ difficulty gripping pushrim, may consider theraband around rim for increased friction. Pt propelled w/c w/ RUE/RLE w/ hemi-technique 40' in controlled environment w/ MaxA and intermittent HOH assist. Pt w/ MaxA for leaning/weight shifting in chair to place sling for nursing to transfer pt back to bed later. Positioned pillows under LUE and behind neck for increased comfort to increase upright time. Pt left seated in w/c w/ QRB on w/ all needs within reach.  Therapy Documentation Precautions:  Precautions Precautions: Cervical;Fall Precaution Comments: discussed log roll for cervical precautions Required Braces or Orthoses: Cervical Brace;Other Brace/Splint Cervical Brace: Hard  collar;At all times Other Brace/Splint: don calf high ACE Wraps and Abd. binder Restrictions Weight Bearing Restrictions: No Pain: Pain Assessment Pain Assessment: No/denies pain Pain Score: 0-No pain Pain Type: Acute pain Pain Location: Shoulder Pain Orientation: Right Pain Intervention(s): Medication (See eMAR)  See FIM for current functional status  Therapy/Group: Individual Therapy  Hosie SpangleGodfrey, Dasia Guerrier Hosie SpangleJess Khadija Thier, PT, DPT 10/08/2013, 12:20 PM

## 2013-10-08 NOTE — Progress Notes (Signed)
Used shampoo cap  Did  Not work too well his head hurt when i rubbed scalp  I tried using wash cloth also.
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
## 2013-10-09 ENCOUNTER — Inpatient Hospital Stay (HOSPITAL_COMMUNITY): Payer: 59

## 2013-10-09 ENCOUNTER — Inpatient Hospital Stay (HOSPITAL_COMMUNITY): Payer: 59 | Admitting: Speech Pathology

## 2013-10-09 ENCOUNTER — Encounter (HOSPITAL_COMMUNITY): Payer: 59

## 2013-10-09 ENCOUNTER — Encounter (HOSPITAL_COMMUNITY): Payer: 59 | Admitting: *Deleted

## 2013-10-09 ENCOUNTER — Encounter (HOSPITAL_COMMUNITY): Payer: Self-pay | Admitting: Dentistry

## 2013-10-09 DIAGNOSIS — I10 Essential (primary) hypertension: Secondary | ICD-10-CM

## 2013-10-09 DIAGNOSIS — S069XAA Unspecified intracranial injury with loss of consciousness status unknown, initial encounter: Secondary | ICD-10-CM

## 2013-10-09 DIAGNOSIS — IMO0002 Reserved for concepts with insufficient information to code with codable children: Secondary | ICD-10-CM

## 2013-10-09 DIAGNOSIS — S069X9A Unspecified intracranial injury with loss of consciousness of unspecified duration, initial encounter: Secondary | ICD-10-CM

## 2013-10-09 DIAGNOSIS — D62 Acute posthemorrhagic anemia: Secondary | ICD-10-CM

## 2013-10-09 LAB — GLUCOSE, CAPILLARY
Glucose-Capillary: 166 mg/dL — ABNORMAL HIGH (ref 70–99)
Glucose-Capillary: 138 mg/dL — ABNORMAL HIGH (ref 70–99)
Glucose-Capillary: 135 mg/dL — ABNORMAL HIGH (ref 70–99)
Glucose-Capillary: 136 mg/dL — ABNORMAL HIGH (ref 70–99)
Glucose-Capillary: 159 mg/dL — ABNORMAL HIGH (ref 70–99)

## 2013-10-09 MED ORDER — BETHANECHOL CHLORIDE 25 MG PO TABS
25.0000 mg | ORAL_TABLET | Freq: Four times a day (QID) | ORAL | Status: DC
  Administered 2013-10-09 – 2013-10-22 (×55): 25 mg via ORAL
  Filled 2013-10-09 (×60): qty 1
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
## 2013-10-09 NOTE — Progress Notes (Signed)
Physical Therapy Session Note  Patient Details  Name: [name] MRN: [id number] Date of Birth: June 13, 1945  Today's Date: 10/09/2013 Session 1 Time: 1002-1102 Time Calculation (min): 60 Session 2 Time: 1300-1330 Time Calculation (min): 30 Missed time: 30 minutes   Short Term Goals: Week 2:  PT Short Term Goal 1 (Week 2): Pt will roll R in bed with side rail req mod A.  PT Short Term Goal 2 (Week 2): Pt will roll L in bed with siderail mod I.  PT Short Term Goal 3 (Week 2): Pt will demonstrates side lie to sit transfer req mod A.  PT Short Term Goal 4 (Week 2): Pt will scoot transfer bed to w/c with slideboard req mod A.  PT Short Term Goal 5 (Week 2): Pt will demonstrate w/c propulsion x 25' req max A.   Skilled Therapeutic Interventions/Progress Updates:    Session 1: Session focused on functional mobility, upright tolerance, sliding board transfers. Received supine in bed toileting w/ RN present. Pt rolled L w/ ModA and R w/ MaxA to assist w/ cleaning up and dressing LB. Pt rolled L several times w/ ModA progressing to MinA. Supine>sit w/ MinA to roll to L and ModA to move sidelying>sit. Throughout session pt reported increased lightheadedness with upright activity, decreased quickly but did not go away completely. Pt w/ x3 sliding board transfers bed<>w/c in session with Mod-MaxA depending on fatigue. Pt maintained seated EOB/EOM w/ close S without change in vital signs. Pt left seated in tilt in space chair w/ all needs within reach. Recommend supporting LUE with pillow while seated upright 2/2 reported shoulder pain and ? Subluxation.  Session 2: Pt received seated in w/c, agreeable to participate in therapy. Pt reported he had been up in chair since AM session (2-3 hours ago) and was fatigued. Pt agreed to come to gym and attempt sliding board transfers. Upon arrival to the gym, therapist tilted chair forward to prepare for transfers and pt reported increased lightheadedness/fatigue.  Pt's BP was 115/64 (133/61 seated in chair at beginning of session. Pt asked that session end early due to increased fatigue. Pt transported back to room and transferred w/c>bed w/ use of MaxiSky sling for therapist/patient safety. Pt left supine in bed w/ all needs within reach.   Therapy Documentation Precautions:  Precautions Precautions: Cervical;Fall Precaution Comments: discussed log roll for cervical precautions Required Braces or Orthoses: Cervical Brace;Other Brace/Splint Cervical Brace: Hard collar;At all times Other Brace/Splint: don calf high ACE Wraps and Abd. binder Restrictions Weight Bearing Restrictions: No General:   Vital Signs: Therapy Vitals Temp: 97.5 F (36.4 C) Temp src: Oral Pulse Rate: 90 Resp: 16 BP: 118/50 mmHg Patient Position (if appropriate): Lying Oxygen Therapy SpO2: 98 % Pain: Pain Assessment Pain Assessment: 0-10 Pain Score: 5  Pain Type: Acute pain Pain Location: Neck Pain Orientation: Posterior Pain Descriptors / Indicators: Stabbing Pain Onset: Sudden Pain Intervention(s): Medication (See eMAR)  See FIM for current functional status  Therapy/Group: Individual Therapy  Hosie SpangleGodfrey, [place] Warzecha Hosie SpangleJess Tracie Dore, PT, DPT 10/09/2013, 1:54 PM
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
## 2013-10-09 NOTE — Progress Notes (Signed)
Occupational Therapy Session and Weekly Progress Note  Patient Details  Name: [name] MRN: [id number] Date of Birth: [birth year]  Beginning of progress report period: September 29, 2013 End of progress report period: October 09, 2013  Today's Date: 10/09/2013 Time:900-9:43 Minutes: 43 Missed Time: 17 minutes due to RN care and prolonged toileting need (using bedpan)  Patient has met 1 of 4 short term goals.  Pt is demonstrating progress toward meeting remaining 3 of 4 initial goals  Patient continues to demonstrate the following deficits: Decreased sitting balance, decreased endurance (activity tolerance), impaired BUE strength, impaired transfers and therefore will continue to benefit from skilled OT intervention to enhance overall performance with BADL.  Patient progressing toward long term goals..  Continue plan of care.  OT Short Term Goals Week 1:  OT Short Term Goal 1 (Week 1): Pt. will drink from cup with straw with mod assist OT Short Term Goal 1 - Progress (Week 1): Met OT Short Term Goal 2 (Week 1): Pt. will feed self with AE with max assist  OT Short Term Goal 2 - Progress (Week 1): Progressing toward goal OT Short Term Goal 3 (Week 1): Pt. will maintain static sitting balance for 10 minutes EOB OT Short Term Goal 3 - Progress (Week 1): Progressing toward goal OT Short Term Goal 4 (Week 1): Pt. will bath self with max assist OT Short Term Goal 4 - Progress (Week 1): Progressing toward goal Week 2:  OT Short Term Goal 1 (Week 2): Pt. will feed self using AE with max assist  OT Short Term Goal 2 (Week 2): Pt. will maintain static sitting balance for 10 minutes while sitting on BSC OT Short Term Goal 3 (Week 2): Pt. will bath self with max assist OT Short Term Goal 4 (Week 2): Pt will complete slide board transfer, w/c<>bed, with max assist X 1 OT Short Term Goal 5 (Week 2): Pt will don shirt with mod assist  Skilled Therapeutic Interventions/Progress Updates: ADL-retraining  with focus on bed mobility, functional use of R-UE, and lower body dressing skills.   Pt received supine in bed reporting discomfort at abdomin with anticipation of bowel movement.   With additional helper for safety, patient requesting assist with bed mobility toward head of bed while he attempted to use right leg, with left leg supported in knee/hip flexion.   Pt provided effort during bed mobility but was unable to bridge his hips during this session and required total assist (pt = 10%).    After setup assist to don TED, pt completed 3 sets of rolling left to right to don pants however activity provoked increased pressure at abdomin and pt reported initiation of BM.   OT provided setup assist to place bedpan and urinal however pt remained on bedpan for prolonged period and requested RN support for pain management which terminated session early.    Therapy Documentation Precautions:  Precautions Precautions: Cervical;Fall Precaution Comments: discussed log roll for cervical precautions Required Braces or Orthoses: Cervical Brace;Other Brace/Splint Cervical Brace: Hard collar;At all times Other Brace/Splint: don calf high ACE Wraps and Abd. binder Restrictions Weight Bearing Restrictions: No  General: General OT Amount of Missed Time: 17 Minutes  Vital Signs: Therapy Vitals Pulse Rate: 77 BP: 158/65 mmHg Patient Position (if appropriate): Lying Oxygen Therapy SpO2: 97 %  Pain: Pain Assessment Pain Assessment: 0-10 Pain Score: 3  Pain Type: Acute pain Pain Location: Shoulder Pain Orientation: Proximal Pain Descriptors / Indicators: Aching Pain Onset: Gradual Pain Intervention(s):  Medication (See eMAR)  See FIM for current functional status  Therapy/Group: Individual Therapy  Second session: Time: 1100-1117 Time Calculation (min):  17 min  Pain Assessment: No/denies pain  Skilled Therapeutic Interventions: Therapeutic activity with focus on facilitation of LUE movement  and strengthening at w/c level.   With gravity eliminated, using arm skate and table top, patient performed left shoulder in/external rotation, flexion/extension, and horizontal ab/adduction.   With LUE supported on table top, pt demonstrated initial forearm supination (0-80 deg).   No wrist or finger or thumb flexion/extension noted during treatment this session. Pt was educated on need to perform SROM or request AAROM frequently during daytime.  Pt was then returned to his room with wife attending.  See FIM for current functional status  Therapy/Group: Individual Therapy  [place] 10/09/2013, 12:08 PM
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
## 2013-10-10 ENCOUNTER — Inpatient Hospital Stay (HOSPITAL_COMMUNITY): Payer: 59

## 2013-10-10 ENCOUNTER — Encounter (HOSPITAL_COMMUNITY): Payer: 59 | Admitting: *Deleted

## 2013-10-10 ENCOUNTER — Inpatient Hospital Stay (HOSPITAL_COMMUNITY): Payer: 59 | Admitting: Speech Pathology

## 2013-10-10 LAB — GLUCOSE, CAPILLARY
Glucose-Capillary: 119 mg/dL — ABNORMAL HIGH (ref 70–99)
Glucose-Capillary: 114 mg/dL — ABNORMAL HIGH (ref 70–99)
Glucose-Capillary: 110 mg/dL — ABNORMAL HIGH (ref 70–99)

## 2013-10-10 NOTE — Progress Notes (Signed)
Physical Therapy Session Note  Patient Details  Name: [name] MRN: [id number] Date of Birth: [birth year]  Today's Date: 10/10/2013 PT Individual Time: 1530-1630 PT Individual Time Calculation (min): 60 min   Short Term Goals: Week 2:  PT Short Term Goal 1 (Week 2): Pt will roll R in bed with side rail req mod A.  PT Short Term Goal 2 (Week 2): Pt will roll L in bed with siderail mod I.  PT Short Term Goal 3 (Week 2): Pt will demonstrates side lie to sit transfer req mod A.  PT Short Term Goal 4 (Week 2): Pt will scoot transfer bed to w/c with slideboard req mod A.  PT Short Term Goal 5 (Week 2): Pt will demonstrate w/c propulsion x 25' req max A.   Skilled Therapeutic Interventions/Progress Updates:  1:1. Pt received semi-reclined in bed, ready for therapy. Focus this session on functional transfers, w/c propulsion and B LE NMR. Pt req max A for t/f sup>sit EOB w/ use of hospital bed functions and mod-max A for SBT bed>w/c and w/c<>tx mat x5. Pt demonstrates excellent ability to direct set up and seq of transfers, intermittent min cues for repositioning of B LE for improved use. Pt set up in basic w/c, practiced w/c propulsion 75'x2 w/ R UE/R LE. Pt req max cues w/ HOH assist for seq and technique, req max A overall. Pt practiced w/c propulsion w/ B LE to target coordination, seq and timing of reciprocal pattern. Pt practiced w/c propulsion in forward and reverse directions, req max-total A for advancement of L LE and max cueing for seq. Pt left sitting in tilt-in-space at end of session w/ all needs in reach, quick release belt in place and wife in room.   Therapy Documentation Precautions:  Precautions Precautions: Cervical;Fall Precaution Comments: discussed log roll for cervical precautions Required Braces or Orthoses: Cervical Brace;Other Brace/Splint Cervical Brace: Hard collar;At all times Other Brace/Splint: don calf high ACE Wraps and Abd. binder Restrictions Weight Bearing  Restrictions: No  See FIM for current functional status  Therapy/Group: Individual Therapy  Denzil HughesKing, Tuyen Uncapher S 10/10/2013, 5:30 PM

## 2013-10-10 NOTE — Progress Notes (Signed)
Physical Therapy Session Note  Patient Details  Name: [name] MRN: [id number] Date of Birth: [birth year]  Today's Date: 10/10/2013 PT Individual Time: 1300-1400 PT Individual Time Calculation (min): 60 min   Short Term Goals: Week 2:  PT Short Term Goal 1 (Week 2): Pt will roll R in bed with side rail req mod A.  PT Short Term Goal 2 (Week 2): Pt will roll L in bed with siderail mod I.  PT Short Term Goal 3 (Week 2): Pt will demonstrates side lie to sit transfer req mod A.  PT Short Term Goal 4 (Week 2): Pt will scoot transfer bed to w/c with slideboard req mod A.  PT Short Term Goal 5 (Week 2): Pt will demonstrate w/c propulsion x 25' req max A.   Skilled Therapeutic Interventions/Progress Updates:    Pt received seated in w/c w/ NT present, pt had been found to be incontinent of bladder and required change. Therapist placed sling and pt transferred w/c>bed w/ MaxiSky lift. Pt w/ several rolls L and R on flat bed, pt able to roll L w/ MinA consistently but requires increased (a) to maintain roll due to decreased muscular endurance. Pt rolled L w/ MinA then moved L sidelying>sit w/ MaxA 2/2 decreased strength in LUE. Pt w/ MaxA SBT to R bed>w/c. Pt w/ x2 stands w/ standing frame for ~2 minutes then 30 seconds. After second stand pt reported max fatigue and lightheadedness, BP stable, spO2 did drop to mid-80s but quickly recovered w/ cues for deep breathing. Pt w/ dependent transfer w/c>bed. Pt completed 2x5 bridges supine in bed for improved bed mobility. Pt completed 2x1' PNF D1 for LLE strengthening, pt able to extend leg w/ moderate resistance from therapist. Pt left supine in bed w/ wife present w/ all needs within reach.  Therapy Documentation Precautions:  Precautions Precautions: Cervical;Fall Precaution Comments: discussed log roll for cervical precautions Required Braces or Orthoses: Cervical Brace;Other Brace/Splint Cervical Brace: Hard collar;At all times Other Brace/Splint:  don calf high ACE Wraps and Abd. binder Restrictions Weight Bearing Restrictions: No Pain: Pain Assessment Pain Assessment: No/denies pain Pain Score: 0-No pain Pain Location: Neck Pain Descriptors / Indicators: Aching Pain Onset: On-going Patients Stated Pain Goal: 4 Pain Intervention(s): Medication (See eMAR)  See FIM for current functional status  Therapy/Group: Individual Therapy  Hosie SpangleGodfrey, Elgin Carn Hosie SpangleJess Mayela Bullard, PT, DPT 10/10/2013, 2:12 PM
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
## 2013-10-10 NOTE — Progress Notes (Signed)
Occupational Therapy Session Note  Patient Details  Name: [name] MRN: [id number] Date of Birth: [birth year]  Today's Date: 10/10/2013 OT Individual Time: [id number] OT Individual Time Calculation (min): 68 min   Short Term Goals: Week 2:  OT Short Term Goal 1 (Week 2): Pt. will feed self using AE with max assist  OT Short Term Goal 2 (Week 2): Pt. will maintain static sitting balance for 10 minutes while sitting on BSC OT Short Term Goal 3 (Week 2): Pt. will bath self with max assist OT Short Term Goal 4 (Week 2): Pt will complete slide board transfer, w/c<>bed, with max assist X 1 OT Short Term Goal 5 (Week 2): Pt will don shirt with mod assist  Skilled Therapeutic Interventions/Progress Updates: ADL-retraining with emphasis on improved bed mobility and BUE function.   Pt received supine in bed reporting no evening bath from previous night with need for assist to cleanse perineal area thoroughly.   Pt completed brief assisted supine bathing, using wash mit to wash 40% of peri-area, all of stomach, chest and 70% of left arm.  Pt demonstrated improved bed mobility by lifting and rotating his pelvis/trunk during bed rolls and assisting upper torso positioning, as instructed, using right UE to lift left UE while turning to his right.   Pt is overall max assist X 1 for supine bathing but is able to assist 50% of upper body dressing task.  Pt. maintains static sitting balance unassisted in prep for slide board transfers and is able to complete right lateral lean when cued during assisted placement of slide board.   Slide board transfer is performed with max assist although pt is now transferring weight anterior through right leg after setup.   Pt completed transfer to w/c with max assist.  Pt left in w/c with RN tech attending at end of session.     Therapy Documentation Precautions:  Precautions Precautions: Cervical;Fall Precaution Comments: discussed log roll for cervical  precautions Required Braces or Orthoses: Cervical Brace;Other Brace/Splint Cervical Brace: Hard collar;At all times Other Brace/Splint: don calf high ACE Wraps and Abd. binder Restrictions Weight Bearing Restrictions: No  Pain: Pain Assessment Pain Assessment: 0-10 Pain Score: 6  Pain Location: Neck Pain Descriptors / Indicators: Aching Pain Onset: On-going Patients Stated Pain Goal: 4 Pain Intervention(s): Medication (See eMAR)  See FIM for current functional status  Therapy/Group: Individual Therapy  [name] 10/10/2013, 12:46 PM

## 2013-10-10 NOTE — Progress Notes (Signed)
Speech Language Pathology Daily Session Note  Patient Details  Name: Arita MissCharlie Madariaga MRN: [id number] Date of Birth: 04-16-1945  Today's Date: 10/10/2013 SLP Individual Time: [id number] SLP Individual Time Calculation (min): 60 min  Short Term Goals: Week 2: SLP Short Term Goal 1 (Week 2): Pt will initiate functional communication to direct his care (i.e. communicating pressure relief needs, managing medications, and requesting assistance with feeding or other self care tasks) for 80% accuracy with supervision cues.  SLP Short Term Goal 2 (Week 2): Pt will recall semi-complex information related to management of spinal cord injury and therapy/medical recommendations for 80% accurcay with supervision.  SLP Short Term Goal 3 (Week 2): Pt will alternate attention during functional tasks for >30 minutes with modified indepedence.   Skilled Therapeutic Interventions: Skilled treatment session focused on addressing cognitive and self-care goals.  SLP facilitated session with Supervision question cues to identify needs and direct SLP on set-up of breakfast tray.  Patient then consumed regular textures and thin liquids with SLP scoping bites, assisting with cup and patient managing finger foods.  Patient then directed SLP on how to reposition him and expressed need for oral care.  Following set-up of oral care via suctioning patient was able to perform own oral care with SLP following thoroughness. Patient with improved attention and recall today SLP suspects this is due to patient being "fresher" this AM.   Continue with current plan of care.    FIM:  Comprehension Comprehension Mode: Auditory Comprehension: 6-Follows complex conversation/direction: With extra time/assistive device Expression Expression Mode: Verbal Expression: 6-Expresses complex ideas: With extra time/assistive device Social Interaction Social Interaction: 6-Interacts appropriately with others with medication or extra time  (anti-anxiety, antidepressant). Problem Solving Problem Solving: 5-Solves complex 90% of the time/cues < 10% of the time Memory Memory: 5-Requires cues to use assistive device FIM - Eating Eating Activity: 5: Set-up assist for cut food;5: Set-up assist for open containers;4: Helper occasionally scoops food on utensil;4: Helper occasionally brings food to mouth;4: Help with picking up utensils;4: Help with managing cup/glass  Pain Pain Assessment Pain Assessment: No/denies pain Pain Score: Asleep  Therapy/Group: Individual Therapy  Charlane FerrettiMelissa Daved Mcfann, M.A., CCC-SLP [phone]  Vinson Tietze 10/10/2013, 9:38 AM

## 2013-10-11 ENCOUNTER — Inpatient Hospital Stay (HOSPITAL_COMMUNITY): Payer: 59 | Admitting: Speech Pathology

## 2013-10-11 ENCOUNTER — Inpatient Hospital Stay (HOSPITAL_COMMUNITY): Payer: 59 | Admitting: Physical Therapy

## 2013-10-11 ENCOUNTER — Inpatient Hospital Stay (HOSPITAL_COMMUNITY): Payer: 59

## 2013-10-11 ENCOUNTER — Encounter (HOSPITAL_COMMUNITY): Payer: 59 | Admitting: *Deleted

## 2013-10-11 ENCOUNTER — Inpatient Hospital Stay (HOSPITAL_COMMUNITY): Payer: 59 | Admitting: Occupational Therapy

## 2013-10-11 DIAGNOSIS — S069X9A Unspecified intracranial injury with loss of consciousness of unspecified duration, initial encounter: Secondary | ICD-10-CM

## 2013-10-11 DIAGNOSIS — IMO0002 Reserved for concepts with insufficient information to code with codable children: Secondary | ICD-10-CM

## 2013-10-11 DIAGNOSIS — D62 Acute posthemorrhagic anemia: Secondary | ICD-10-CM

## 2013-10-11 DIAGNOSIS — S069XAA Unspecified intracranial injury with loss of consciousness status unknown, initial encounter: Secondary | ICD-10-CM

## 2013-10-11 DIAGNOSIS — I10 Essential (primary) hypertension: Secondary | ICD-10-CM

## 2013-10-11 LAB — GLUCOSE, CAPILLARY
Glucose-Capillary: 116 mg/dL — ABNORMAL HIGH (ref 70–99)
Glucose-Capillary: 142 mg/dL — ABNORMAL HIGH (ref 70–99)
Glucose-Capillary: 147 mg/dL — ABNORMAL HIGH (ref 70–99)
Glucose-Capillary: 136 mg/dL — ABNORMAL HIGH (ref 70–99)
Glucose-Capillary: 101 mg/dL — ABNORMAL HIGH (ref 70–99)
Glucose-Capillary: 102 mg/dL — ABNORMAL HIGH (ref 70–99)
Glucose-Capillary: 89 mg/dL (ref 70–99)
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
## 2013-10-11 NOTE — Progress Notes (Signed)
Physical Therapy Session Note  Patient Details  Name: [name] MRN: [id number] Date of Birth: 1945-02-28  Today's Date: 10/11/2013 Session 1 PT Individual Time: 1109-1209 PT Individual Time Calculation (min): 60 min  Session 2 Time: 1400-1500 Time Calculation (min): 60   Short Term Goals: Week 2:  PT Short Term Goal 1 (Week 2): Pt will roll R in bed with side rail req mod A.  PT Short Term Goal 2 (Week 2): Pt will roll L in bed with siderail mod I.  PT Short Term Goal 3 (Week 2): Pt will demonstrates side lie to sit transfer req mod A.  PT Short Term Goal 4 (Week 2): Pt will scoot transfer bed to w/c with slideboard req mod A.  PT Short Term Goal 5 (Week 2): Pt will demonstrate w/c propulsion x 25' req max A.   Skilled Therapeutic Interventions/Progress Updates:    Session 1: Pt received w/ handoff from OT, supine w/ HOB elevated, agreeable to participate in therapy. Session focused on wheelchair propulsion and BLE strengthening. Pt w/ SBT bed>w/c w/ ModA into standard wheelchair. Pt propelled w/c 25' w/ BLE forward, 25' w/ BLE backward, 40' w/ RUE/RLE forward, 20' w/ RLE backward. Pt noted difficulty coordinating RUE/RLE to both propel w/c forward at same time. When using LLE pt required assist from therapist to place foot in position due to friction between shoes/floor preventing pt from sliding foot. With L foot placed in position pt was able to engage quads>HS on LLE during w/c propulsion. Pt very fatigued from activity, required extended rest breaks between bouts of propulsion. Pt w/ SBT w/c>bed, then bed>tilt in space w/c, both w/ ModA. Pt left tilted back in TIS w/c w/ all needs within reach.  Session 2: Session focused on NMR for LLE, standing tolerance. Pt received handoff from SLP, agreeable to participate in therapy. Pt transferred supine<>sit w/ ModA for managing LLE and for moving trunk L sidelying to sit. Pt w/ SBT bed>w/c then w/c>mat table both w/ ModA. Pt fitted w/ Bioness  cuff on anterior tib and quad on LLE, anterior tib pad not activated during treatment. Pt engaged in standing activity at edge of mat table w/ elevated mat table and standing frame to block knees. See Bioness unit for settings. With bioness engaged pt was cued to perform standing TKE on LLE and to maintain upright positioning. Pt completed x3 standing cycles, ~1' x2 then ~45 seconds. Noted very tight hip flexors limiting upright standing w/ knee extended. Pt had difficulty coordinating knee extension, hip extension, and trunk extension during on-cycle for bioness. Pt very fatigued after standing activity, requesting to transfer back to bed instead of staying up in TIS w/c. Pt w/ SBT w/ ModA w/c>bed, then moved sit>supine w/ MinA. Pt left supine in bed w/ HOB elevated w/ all needs within reach.   Therapy Documentation Precautions:  Precautions Precautions: Cervical;Fall Precaution Comments: discussed log roll for cervical precautions Required Braces or Orthoses: Cervical Brace;Other Brace/Splint Cervical Brace: Hard collar;At all times Other Brace/Splint: don calf high ACE Wraps and Abd. binder Restrictions Weight Bearing Restrictions: No Vital Signs: Therapy Vitals Temp: 98.2 F (36.8 C) Temp src: Oral Pulse Rate: 87 Resp: 18 BP: 142/54 mmHg Patient Position (if appropriate): Lying Oxygen Therapy SpO2: 99 % O2 Device: None (Room air) Pain: Pain Assessment Pain Assessment: 0-10 Pain Score: 7  Pain Type: Acute pain Pain Location: Shoulder Pain Intervention(s): Medication (See eMAR)  See FIM for current functional status  Therapy/Group: Individual Therapy  Glenetta HewGodfrey,  Gaynelle Adu, PT, DPT 10/11/2013, 5:09 PM

## 2013-10-11 NOTE — Progress Notes (Addendum)
Speech Language Pathology Daily Session Note  Patient Details  Name: [name] MRN: [id number] Date of Birth: 29-Oct-1945  Today's Date: 10/11/2013 SLP Individual Time: 1401-1501 SLP Individual Time Calculation (min): 60 min  Short Term Goals: Week 2: SLP Short Term Goal 1 (Week 2): Pt will initiate functional communication to direct his care (i.e. communicating pressure relief needs, managing medications, and requesting assistance with feeding or other self care tasks) for 80% accuracy with supervision cues.  SLP Short Term Goal 2 (Week 2): Pt will recall semi-complex information related to management of spinal cord injury and therapy/medical recommendations for 80% accurcay with supervision.  SLP Short Term Goal 3 (Week 2): Pt will alternate attention during functional tasks for >30 minutes with modified indepedence.   Skilled Therapeutic Interventions:  Pt was seen for skilled speech therapy session targeting cognitive and self care goals.  Upon arrival, pt had independently initiated use of call bell to indicate needs to nursing.  During nursing care, pt directed his care with modified independence to maximize functional independence with bed mobility.  Pt recalled at least 3 details from today's therapy sessions as well as how they relate to his current goals and progress in therapy with supervision question cues.   Pt alternated his attention between various communication partners with modified independence and was noted to redirect himself to structured tasks in the presence of distractions and interruptions with no cuing needed.  SLP facilitated the session with a semi-complex card game targeting planning, thought organization, and error awareness for 100% accuracy with initial supervision instructional cues faded to modified independence.  Pt handed off to PT at end of session.    FIM:  Comprehension Comprehension Mode: Auditory Comprehension: 6-Follows complex conversation/direction:  With extra time/assistive device Expression Expression Mode: Verbal Expression: 7-Expresses complex ideas: With no assist Social Interaction Social Interaction: 6-Interacts appropriately with others with medication or extra time (anti-anxiety, antidepressant). Problem Solving Problem Solving: 5-Solves basic 90% of the time/requires cueing < 10% of the time Memory Memory: 6-More than reasonable amt of time  Pain Pain Assessment Pain Assessment: No/denies pain  Therapy/Group: Individual Therapy  Jackalyn LombardNicole Antowan Samford, M.A. CCC-SLP  Zamire Whitehurst, Melanee SpryNicole L 10/11/2013, 4:54 PM

## 2013-10-11 NOTE — Progress Notes (Signed)
Occupational Therapy Session Note  Patient Details  Name: [name] MRN: [id number] Date of Birth: [birth year]  Today's Date: 10/11/2013 OT Individual Time: 1006-1108 OT Individual Time Calculation (min): 62 min   Short Term Goals: Week 2:  OT Short Term Goal 1 (Week 2): Pt. will feed self using AE with max assist  OT Short Term Goal 2 (Week 2): Pt. will maintain static sitting balance for 10 minutes while sitting on BSC OT Short Term Goal 3 (Week 2): Pt. will bath self with max assist OT Short Term Goal 4 (Week 2): Pt will complete slide board transfer, w/c<>bed, with max assist X 1 OT Short Term Goal 5 (Week 2): Pt will don shirt with mod assist  Skilled Therapeutic Interventions/Progress Updates:  Patient resting in bed upon arrival and reporting pain in left shoulder.  This OT completed myofacial and massage on left deltoid with patient reporting significant relief.  BUE P/A/AA ROM exercises (left more than right), reviewed use tenodesis grasp on left side once his wrist flex and ext improves.  Patient with questions regarding bowel program and scheduling of the program, stretching, breakfast and therapies.  Reviewed some possibilities with patient and discussed this with patient's primary PT who agreed to follow-up with nursing and scheduling. Patient reported that this might be a mor comfortable option in the future.  Patient reported need to urinate yet not time to obtain a urinal therefore he urinated in his brief and performed bed mobility to roll and change his brief. Patient able to donn both hands in a pull over shirt that was set up and laid out on his lap.  This OT recommended another technique to include one arm then over the head/c-color, then other arm and then pulling it down.    Therapy Documentation Precautions:  Precautions Precautions: Cervical;Fall Precaution Comments: discussed log roll for cervical precautions Required Braces or Orthoses: Cervical Brace;Other  Brace/Splint Cervical Brace: Hard collar;At all times Other Brace/Splint: don calf high ACE Wraps and Abd. binder Restrictions Weight Bearing Restrictions: No Pain: 8/10 left shoulder pain, premedicated.  Provided myofacial release and massage on middle and posterior deltoid with patient reporting improvement of 2/10 pain. ADL: See FIM for current functional status  Therapy/Group: Individual Therapy  [name] 10/11/2013, 12:15 PM

## 2013-10-12 ENCOUNTER — Encounter (HOSPITAL_COMMUNITY): Payer: 59 | Admitting: *Deleted

## 2013-10-12 ENCOUNTER — Inpatient Hospital Stay (HOSPITAL_COMMUNITY): Payer: 59 | Admitting: Physical Therapy

## 2013-10-12 ENCOUNTER — Inpatient Hospital Stay (HOSPITAL_COMMUNITY): Payer: 59 | Admitting: Speech Pathology

## 2013-10-12 ENCOUNTER — Encounter (HOSPITAL_COMMUNITY): Payer: 59 | Admitting: Occupational Therapy

## 2013-10-12 ENCOUNTER — Inpatient Hospital Stay (HOSPITAL_COMMUNITY): Payer: 59 | Admitting: Rehabilitation

## 2013-10-12 LAB — GLUCOSE, CAPILLARY
Glucose-Capillary: 132 mg/dL — ABNORMAL HIGH (ref 70–99)
Glucose-Capillary: 125 mg/dL — ABNORMAL HIGH (ref 70–99)
Glucose-Capillary: 86 mg/dL (ref 70–99)
Glucose-Capillary: 111 mg/dL — ABNORMAL HIGH (ref 70–99)

## 2013-10-12 MED ORDER — COLLAGENASE 250 UNIT/GM EX OINT
TOPICAL_OINTMENT | Freq: Every day | CUTANEOUS | Status: DC
  Administered 2013-10-12: 12:00:00 via TOPICAL
  Administered 2013-10-13 – 2013-10-16 (×4): 1 via TOPICAL
  Administered 2013-10-17 – 2013-10-20 (×4): via TOPICAL
  Filled 2013-10-12: qty 30

## 2013-10-12 NOTE — Progress Notes (Signed)
Physical Therapy Session Note  Patient Details  Name: [name] MRN: [id number] Date of Birth: 11/12/1945  Today's Date: 10/12/2013 PT Individual Time: 1506-1610 PT Individual Time Calculation (min): 64 min   Short Term Goals: Week 2:  PT Short Term Goal 1 (Week 2): Pt will roll R in bed with side rail req mod A.  PT Short Term Goal 2 (Week 2): Pt will roll L in bed with siderail mod I.  PT Short Term Goal 3 (Week 2): Pt will demonstrates side lie to sit transfer req mod A.  PT Short Term Goal 4 (Week 2): Pt will scoot transfer bed to w/c with slideboard req mod A.  PT Short Term Goal 5 (Week 2): Pt will demonstrate w/c propulsion x 25' req max A.   Skilled Therapeutic Interventions/Progress Updates:   Pt received lying in bed, agreeable to therapy session this afternoon.  Performed bed mobility with max A with assist for BLEs out of bed with assist and cues for increased hip flex, as well as assist for trunk and hips for weight shift over to R hip due to weakness in LUE.  Provided pt with min/mod cues on how to perform bed mobility.  Pt transferred bed>w/c and again in gym w/c<>mat at mod A level without use of slideboard via lateral scoot.  Continue to provide cues and demonstration of head/hip relationship and pt able to verbalize understanding when questioned, however requires facilitation for increased forward weight shift and trunk for adequate elevation of buttocks.  During session attempted to utilize maxi sky while also using Bioness for increased quad activation during stance.  Set up Bioness with calf unit and thigh unit with good contraction at both ankle and knee.  Once set up for maxi sky, note that pt unable to provided enough control without sling getting too close to neck, therefore discontinued use of maxi sky and attempted "three muskateer stand" however due to increased pain in L shoulder, unable to tolerate.  Remainder of session focused on seated NMR for quad contraction  with use of Bioness when performing LAQ's with LLE.  Pt tolerated well.  Also note increased trigger points in pts biceps and upper traps/levator, therefore provided light manual trigger point massage.  Pt tolerated well.  Assisted pt back to room and left in w/c tilted.  Educated wife on how to perform light trigger point massage and applied moist heat to area with two layered towels with 20 min timer set with cues to remove if heat pack gets too hot.  Pt and wife verbalized understanding.  Pt left with all needs in reach and call bell in lap.    Therapy Documentation Precautions:  Precautions Precautions: Cervical;Fall Precaution Comments: discussed log roll for cervical precautions Required Braces or Orthoses: Cervical Brace;Other Brace/Splint Cervical Brace: Hard collar;At all times Other Brace/Splint: don calf high ACE Wraps and Abd. binder Restrictions Weight Bearing Restrictions: No   Pain: Pain Assessment Pain Assessment: Pt with increased pain in L shoulder with noted trigger points in bicep and upper traps/levator.  Moist heat pack applied at end of session. Also performed manual trigger point massage and educated wife on how to assist.   See FIM for current functional status  Therapy/Group: Individual Therapy  Vista Deckarcell, Samiyah Stupka Ann 10/12/2013, 7:00 PM

## 2013-10-12 NOTE — Plan of Care (Signed)
Problem: RH Balance Goal: LTG Patient will maintain dynamic sitting balance (PT) LTG: Patient will maintain dynamic sitting balance with assistance during mobility activities (PT)  Goal upgraded due to pt progress.   Problem: RH Bed Mobility Goal: LTG Patient will perform bed mobility with assist (PT) LTG: Patient will perform bed mobility with assistance, with/without cues (PT).  Goal upgraded due to pt progress.   Problem: RH Bed to Chair Transfers Goal: LTG Patient will perform bed/chair transfers w/assist (PT) LTG: Patient will perform bed/chair transfers with assistance, with/without cues (PT).  Goal upgraded due to pt progress.

## 2013-10-12 NOTE — Progress Notes (Addendum)
Physical Therapy Session Note  Patient Details  Name: [name] MRN: [id number] Date of Birth: [birth year]  Today's Date: 10/12/2013 PT Individual Time: 1030-1140 PT Individual Time Calculation (min): 70 min   Short Term Goals: Week 2:  PT Short Term Goal 1 (Week 2): Pt will roll R in bed with side rail req mod A.  PT Short Term Goal 2 (Week 2): Pt will roll L in bed with siderail mod I.  PT Short Term Goal 3 (Week 2): Pt will demonstrates side lie to sit transfer req mod A.  PT Short Term Goal 4 (Week 2): Pt will scoot transfer bed to w/c with slideboard req mod A.  PT Short Term Goal 5 (Week 2): Pt will demonstrate w/c propulsion x 25' req max A.   Skilled Therapeutic Interventions/Progress Updates:    Therapeutic Activity: PT instructs pt in scoot transfer with slideboard TIS w/c to bed req mod A - pt demonstrates good bottom clearance initially, but then poor bottom clearance with improvement of head-hips relationship. Pt req mod A sit to supine transfer in bed. PT instructs pt in rolling R in bed req mod A and rolling L in bed req CGA-SBA for safety, multiple times, while doffing/donning pants due to pt sensation of having a BM, but upon inspection, no BM present. PT instructs pt in L side lie to sit transfer edge of bed req mod A. PT instructs pt in scoot transfer without slideboard to R from bed to w/c req min A and then back with slideboard req min A.   Therapeutic Exercise: PT instructs pt in strengthening exercises to B LE: bridges with feet stabilized x 10, R LE SLR x 10, L LE A/AROM heel slide with mod resistance into hip/knee extension x 10, clam shells x 10 B, hip IR in B hook lie x 10.   W/C Management: PT instructs pt in w/c propulsion with R UE/LE and L UE prn req min A for L hand stability on push rim prn and occasional SBA x 80'.   Pt became very preoccupied with having a bowel movement and PT checked pt's diaper twice, but no sign of BM. Pt agreed to continue with  therapy and have RN called to do digital stimulation afterwards. PT is progressing in funcitonal mobility, L side of body continues to be weaker than R side of body, but pt is demonstrating return of muscle activity. Continue per PT POC.   Therapy Documentation Precautions:  Precautions Precautions: Cervical;Fall Precaution Comments: discussed log roll for cervical precautions Required Braces or Orthoses: Cervical Brace;Other Brace/Splint Cervical Brace: Hard collar;At all times Other Brace/Splint: don calf high ACE Wraps and Abd. binder Restrictions Weight Bearing Restrictions: No Pain: Pain Assessment Pain Assessment: 0-10 Pain Score: 5  Pain Type: Acute pain Pain Location: Shoulder Pain Orientation: Left Pain Descriptors / Indicators: Sore Pain Intervention(s): Rest;Repositioned Multiple Pain Sites: No  See FIM for current functional status  Therapy/Group: Individual Therapy  Avacyn Kloosterman M 10/12/2013, 10:42 AM

## 2013-10-12 NOTE — Progress Notes (Signed)
Bladder scan was done 472 result. Pt began to urinate before inserting cath.

## 2013-10-12 NOTE — Progress Notes (Signed)
Speech Language Pathology Daily Session Note  Patient Details  Name: [name] MRN: [id number] Date of Birth: 31-Jan-1946  Today's Date: 10/12/2013 SLP Individual Time: 1400-1500 SLP Individual Time Calculation (min): 60 min  Short Term Goals: Week 2: SLP Short Term Goal 1 (Week 2): Pt will initiate functional communication to direct his care (i.e. communicating pressure relief needs, managing medications, and requesting assistance with feeding or other self care tasks) for 80% accuracy with supervision cues.  SLP Short Term Goal 2 (Week 2): Pt will recall semi-complex information related to management of spinal cord injury and therapy/medical recommendations for 80% accurcay with supervision.  SLP Short Term Goal 3 (Week 2): Pt will alternate attention during functional tasks for >30 minutes with modified indepedence.   Skilled Therapeutic Interventions: Pt was seen for skilled speech therapy session targeting cognitive and self care goals.  Upon arrival, pt had independently initiated use of call bell to indicate needs to nursing.  During nursing care, pt directed his care with modified independence to maximize functional independence with bed mobility.  Pt recalled at least 3 details from today's therapy sessions as well as how they relate to his current goals and progress in therapy with supervision question cues.   Pt alternated his attention between various communication partners with modified independence and was noted to redirect himself to structured tasks in the presence of distractions and interruptions with no cuing needed.  SLP facilitated the session with a semi-complex card game targeting planning, thought organization, and error awareness for 100% accuracy with initial supervision instructional cues faded to modified independence.  Pt handed off to PT at end of session.      FIM:  Comprehension Comprehension Mode: Auditory Comprehension: 6-Follows complex conversation/direction:  With extra time/assistive device Expression Expression Mode: Verbal Expression: 6-Expresses complex ideas: With extra time/assistive device Social Interaction Social Interaction: 6-Interacts appropriately with others with medication or extra time (anti-anxiety, antidepressant). Problem Solving Problem Solving: 5-Solves complex 90% of the time/cues < 10% of the time Memory Memory: 6-More than reasonable amt of time  Pain Pain Assessment Pain Assessment: No/denies pain  Therapy/Group: Individual Therapy  Bruce Mayers 10/12/2013, 4:25 PM

## 2013-10-12 NOTE — Progress Notes (Signed)
Occupational Therapy Session Note  Patient Details  Name: [name] MRN: [id number] Date of Birth: 05/26/1945  Today's Date: 10/12/2013 OT Individual Time: 0900-1000 OT Individual Time Calculation (min): 60 min   Short Term Goals: Week 1:  OT Short Term Goal 1 (Week 1): Pt. will drink from cup with straw with mod assist OT Short Term Goal 1 - Progress (Week 1): Met OT Short Term Goal 2 (Week 1): Pt. will feed self with AE with max assist  OT Short Term Goal 2 - Progress (Week 1): Progressing toward goal OT Short Term Goal 3 (Week 1): Pt. will maintain static sitting balance for 10 minutes EOB OT Short Term Goal 3 - Progress (Week 1): Progressing toward goal OT Short Term Goal 4 (Week 1): Pt. will bath self with max assist OT Short Term Goal 4 - Progress (Week 1): Progressing toward goal  Week 2:  OT Short Term Goal 1 (Week 2): Pt. will feed self using AE with max assist  OT Short Term Goal 2 (Week 2): Pt. will maintain static sitting balance for 10 minutes while sitting on BSC OT Short Term Goal 3 (Week 2): Pt. will bath self with max assist OT Short Term Goal 4 (Week 2): Pt will complete slide board transfer, w/c<>bed, with max assist X 1 OT Short Term Goal 5 (Week 2): Pt will don shirt with mod assist  Skilled Therapeutic Interventions/Progress Updates:  Patient found supine in bed with 7/10 pain in neck, RN made aware and present during session for medication management. Patient willing to work with therapy even though he had complaints of pain. Focused skilled intervention on patient directing care during ADL, positioning in bed, UB/LB dressing in supine position with HOB elevated prn, bed mobility, supine>sit, EOB>w/c slide board transfer (patient mod assist), positioning in w/c, and overall activity tolerance/endurance. Therapist introduced reacher for LB dressing and patient able to grasp and release reacher using right hand. At end of session, left patient seated in w/c with all  needs within reach and quick release belt donned.   Precautions:  Precautions Precautions: Cervical;Fall Precaution Comments: discussed log roll for cervical precautions Required Braces or Orthoses: Cervical Brace;Other Brace/Splint Cervical Brace: Hard collar;At all times Other Brace/Splint: don calf high ACE Wraps and Abd. binder Restrictions Weight Bearing Restrictions: No  See FIM for current functional status  CLAY,PATRICIA 10/12/2013, 10:25 AM  

## 2013-10-12 NOTE — Plan of Care (Signed)
Problem: RH PAIN MANAGEMENT Goal: RH STG PAIN MANAGED AT OR BELOW PT'S PAIN GOAL Pain goal less than 3  Outcome: Not Progressing Patient rates pain at 8. Oxy IR 5 mg given. adm

## 2013-10-13 ENCOUNTER — Inpatient Hospital Stay (HOSPITAL_COMMUNITY): Payer: 59 | Admitting: Physical Therapy

## 2013-10-13 ENCOUNTER — Inpatient Hospital Stay (HOSPITAL_COMMUNITY): Payer: 59 | Admitting: *Deleted

## 2013-10-13 ENCOUNTER — Encounter (HOSPITAL_COMMUNITY): Payer: 59 | Admitting: *Deleted

## 2013-10-13 LAB — GLUCOSE, CAPILLARY
Glucose-Capillary: 118 mg/dL — ABNORMAL HIGH (ref 70–99)
Glucose-Capillary: 149 mg/dL — ABNORMAL HIGH (ref 70–99)
Glucose-Capillary: 100 mg/dL — ABNORMAL HIGH (ref 70–99)
Glucose-Capillary: 175 mg/dL — ABNORMAL HIGH (ref 70–99)

## 2013-10-13 MED ORDER — SENNOSIDES-DOCUSATE SODIUM 8.6-50 MG PO TABS
2.0000 | ORAL_TABLET | Freq: Two times a day (BID) | ORAL | Status: DC
  Administered 2013-10-13 – 2013-10-14 (×4): 2 via ORAL
  Filled 2013-10-13 (×4): qty 2

## 2013-10-13 NOTE — Progress Notes (Signed)
Physical Therapy Session Note  Patient Details  Name: [name] MRN: [id number] Date of Birth: Jul 26, 1945  Today's Date: 10/13/2013 PT Individual Time: 1032-1138 PT Individual Time Calculation (min): 66 min   Short Term Goals: Week 2:  PT Short Term Goal 1 (Week 2): Pt will roll R in bed with side rail req mod A.  PT Short Term Goal 2 (Week 2): Pt will roll L in bed with siderail mod I.  PT Short Term Goal 3 (Week 2): Pt will demonstrates side lie to sit transfer req mod A.  PT Short Term Goal 4 (Week 2): Pt will scoot transfer bed to w/c with slideboard req mod A.  PT Short Term Goal 5 (Week 2): Pt will demonstrate w/c propulsion x 25' req max A.   Skilled Therapeutic Interventions/Progress Updates:   Pt seated EOB finishing B&D with OT; pt seated EOB with supervision and able to maintain balance while donning shoes EOB.  With bed elevated pt performed squat scoots laterally bed > w/c with mod A and one episode of buttocks landing on the drive wheel.  Performed w/c mobility training in regular manual w/c x 25' with RUE and bilat LE propulsion with extra time and initially required min A to extend LLE but then pt progressed to supervision with extending and flexing L knee but fatigued quickly.  Continued transfer training without board w/c > mat with mod A but pt again became stuck on wheel; returned to use of slideboard with pt performing with min A.  Performed LE strengthening and weight shift training on mat with RUE support on chair arm rest in front of patient to assist with full anterior weight shift over BOS; pt able to perform sit > squat and maintain a squat for 5-6 seconds x 8 reps with mod A with verbal cues to maintain upright head and trunk.  Transitioned into maintaining squat during lateral scooting to tilt in space w/c with min-mod A; during final scoot pt buttocks became stuck on mat and pt required mod-max A to finish transfer to w/c.  Pt tilted back in w/c and reported  immediate relief in buttocks pain.  Pt returned to room and set up with wife in room; RN alerted to pt hitting his buttocks on w/c and mat; RN reports she will inspect pt skin for new issues when he returns to bed after lunch.    Therapy Documentation Precautions:  Precautions Precautions: Cervical;Fall Precaution Comments: discussed log roll for cervical precautions Required Braces or Orthoses: Cervical Brace;Other Brace/Splint Cervical Brace: Hard collar;At all times Other Brace/Splint: don calf high ACE Wraps and Abd. binder Restrictions Weight Bearing Restrictions: No Vital Signs: Therapy Vitals BP: 137/70 mmHg Patient Position (if appropriate): Sitting Pain: Pain Assessment Pain Score: 4  Faces Pain Scale: Hurts little more Pain Type: Acute pain Pain Location: Buttocks Pain Orientation: Medial Pain Descriptors / Indicators: Sore Pain Onset: On-going Pain Intervention(s): Repositioned Locomotion : Wheelchair Mobility Distance: 25   See FIM for current functional status  Therapy/Group: Individual Therapy  [name], [name] 10/13/2013, 12:21 PM

## 2013-10-13 NOTE — Progress Notes (Signed)
Speech Language Pathology Discharge Summary  Patient Details  Name: [name] MRN: [id number] Date of Birth: May 18, 1945   Patient has met 4 of 4 long term goals.  Patient to discharge at overall Modified Independent;Supervision level.  Reasons goals not met: n/a   Clinical Impression/Discharge Summary:  Pt has made functional gains while inpatient and is discharging from speech services having met 4 out of 4 long term goals.  Pt is currently mod I/supervision level assist for cognitive tasks such as recall of complex information, directing his care, and alternating attention.  Pt has returned to baseline cognitive function and has met all established treatment goals, therefore, no further SLP services are indicated at this time.     Care Partner:  Caregiver Able to Provide Assistance: Yes  Type of Caregiver Assistance: Physical;Cognitive  Recommendation:  None   Equipment: none recommended by SLP    Reasons for discharge: Treatment goals met   Patient/Family Agrees with Progress Made and Goals Achieved: Yes   See FIM for current functional status  Windell Moulding, M.A. CCC-SLP  Aidynn Krenn, Selinda Orion 10/13/2013, 5:37 PM

## 2013-10-13 NOTE — Progress Notes (Signed)
Physical Therapy Session Note  Patient Details  Name: [name] MRN: [id number] Date of Birth: 1945-08-24  Today's Date: 10/13/2013 PT Individual Time: 1400-1500 PT Individual Time Calculation (min): 60 min   Short Term Goals: Week 2:  PT Short Term Goal 1 (Week 2): Pt will roll R in bed with side rail req mod A.  PT Short Term Goal 2 (Week 2): Pt will roll L in bed with siderail mod I.  PT Short Term Goal 3 (Week 2): Pt will demonstrates side lie to sit transfer req mod A.  PT Short Term Goal 4 (Week 2): Pt will scoot transfer bed to w/c with slideboard req mod A.  PT Short Term Goal 5 (Week 2): Pt will demonstrate w/c propulsion x 25' req max A.   Skilled Therapeutic Interventions/Progress Updates:    Therapeutic Activity: PT instructs pt in rolling R without rail req mod A and rolling L without rail req SBA, L sidelie to sit req mod A and verbal cues for hand placement. PT instructs pt in scoot transfer to R from bed to w/c with slideboard req min A, progressing to CGA with verbal cues for head-hips.   Neuromuscular Reeducation: PT instructs pt in chair push ups x 10 reps req hand assist for L hand placement on armrest. PT instructs pt in partial sit to stand with hands planted on low mat req min A and verbal encouragement to push with legs in partial stand 2 x 10 reps. PT instructs pt in sit to stand with Eval walker req min-mod A x 2 person and verbal cues to tuck bottom and lift head up, while PT blocks pt's L knee.   W/C Management: PT instructs pt in w/c propulsion with B UEs req hand over hand assist on L side for keeping the hand attached to the wheel during pushing (min A) x 50'. PT instructs pt in B LE w/c propulsion with PT assist for L hip flexion, pt extends L knee, then digs L heel into floor and req PT assist to pull with L hamstring (mod A) x 50'.   Pt is progressing in mobility and LE return, daily. Continue per PT POC.    Therapy Documentation Precautions:   Precautions Precautions: Cervical;Fall Precaution Comments: discussed log roll for cervical precautions Required Braces or Orthoses: Cervical Brace;Other Brace/Splint Cervical Brace: Hard collar;At all times Other Brace/Splint: don calf high ACE Wraps and Abd. binder Restrictions Weight Bearing Restrictions: No    Vital Signs: Therapy Vitals Pulse Rate: 88 BP: 115/61 mmHg Patient Position (if appropriate): Sitting Oxygen Therapy SpO2: 98 % O2 Device: None (Room air) Pain: Pain Assessment Pain Assessment: 0-10 Pain Score: 6  Pain Type: Acute pain Pain Location: Shoulder Pain Orientation: Left Pain Descriptors / Indicators: Aching Pain Onset: On-going Pain Intervention(s): Rest;Repositioned Multiple Pain Sites: No  See FIM for current functional status  Therapy/Group: Individual Therapy  Lisaanne Lawrie M 10/13/2013, 2:23 PM

## 2013-10-13 NOTE — Progress Notes (Signed)
Social Work Patient ID: [name], male   DOB: 1945/10/20, 68 y.o.   MRN: [id number]  Late note:  Met with pt and wife on Wednesday to review team conference.  Both aware and agreeable with continued aim toward target of 9/4.  Both very pleased with gains being made and pt states, "I can really tell I'm getting better."  No concerns at this point.  Alfonse Alpers, LCSW to resume case management upon her return Monday.  Nicasio Barlowe, LCSW
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
## 2013-10-14 ENCOUNTER — Inpatient Hospital Stay (HOSPITAL_COMMUNITY): Payer: 59

## 2013-10-14 DIAGNOSIS — S069XAA Unspecified intracranial injury with loss of consciousness status unknown, initial encounter: Secondary | ICD-10-CM

## 2013-10-14 DIAGNOSIS — I10 Essential (primary) hypertension: Secondary | ICD-10-CM

## 2013-10-14 DIAGNOSIS — IMO0002 Reserved for concepts with insufficient information to code with codable children: Secondary | ICD-10-CM

## 2013-10-14 DIAGNOSIS — D62 Acute posthemorrhagic anemia: Secondary | ICD-10-CM

## 2013-10-14 DIAGNOSIS — S069X9A Unspecified intracranial injury with loss of consciousness of unspecified duration, initial encounter: Secondary | ICD-10-CM

## 2013-10-14 LAB — GLUCOSE, CAPILLARY
Glucose-Capillary: 132 mg/dL — ABNORMAL HIGH (ref 70–99)
Glucose-Capillary: 103 mg/dL — ABNORMAL HIGH (ref 70–99)
Glucose-Capillary: 122 mg/dL — ABNORMAL HIGH (ref 70–99)
Glucose-Capillary: 153 mg/dL — ABNORMAL HIGH (ref 70–99)
Glucose-Capillary: 105 mg/dL — ABNORMAL HIGH (ref 70–99)

## 2013-10-14 NOTE — Progress Notes (Signed)
Physical Therapy Session Note  Patient Details  Name: Renell Allum MRN: [id number] Date of Birth: 02-Apr-1945  Today's Date: 10/14/2013 PT Individual Time: 1445-1545 PT Individual Time Calculation (min): 60 min   Short Term Goals: Week 2:  PT Short Term Goal 1 (Week 2): Pt will roll R in bed with side rail req mod A.  PT Short Term Goal 2 (Week 2): Pt will roll L in bed with siderail mod I.  PT Short Term Goal 3 (Week 2): Pt will demonstrates side lie to sit transfer req mod A.  PT Short Term Goal 4 (Week 2): Pt will scoot transfer bed to w/c with slideboard req mod A.  PT Short Term Goal 5 (Week 2): Pt will demonstrate w/c propulsion x 25' req max A.   Skilled Therapeutic Interventions/Progress Updates:  1:1. Pt received sitting in tilt-in-space w/c, ready for therapy. Focus this session on gait training and NMR.   [place] Dec + utilized to target B quad and glute contraction during static standing. Pt able to progress to ambulation w/ use of walking sling and removal of foot plate. Rope attachment kept looser to allow distance between pt and machine so pt able to take more normalized steps. Knee block kept on as pt unable to sustain gross LE extension during ambulation. Intermittent manual facilitation of B LE extension and L LE advancement. Pt req Ax2 persons for ambulation 6'x1 and 8'x1. Pt reporting pain in L shoulder increases to 7/10 with WB through L LE, but decreases with rest.   Pt utilized NuStep to target B UE/LE closed chain coordination w/ use of adaptor for L UE to maintain grasp. Pt w/ excellent tolerance to level 2x48min, manual facilitation for L hip adduction to maintain alignment. Trial use of theraband around B thighs to promote B hip abd, pt able to demonstrate increased pace and more fluid motion.   Use of [place] Dec + with foot plate to t/f pt from tilt-in-space>NuStep>basic w/c. Pt sitting in basic wheelchair at end of session to build sitting tolerance/endurance w/ decreased  support. Pt left w/ all needs in reach, quick release belt in place, wife and friends in room.   Therapy Documentation Precautions:  Precautions Precautions: Cervical;Fall Precaution Comments: discussed log roll for cervical precautions Required Braces or Orthoses: Cervical Brace;Other Brace/Splint Cervical Brace: Hard collar;At all times Other Brace/Splint: don calf high ACE Wraps and Abd. binder Restrictions Weight Bearing Restrictions: No Pain: Pain Assessment Pain Assessment: 0-10 Pain Score: 6  Faces Pain Scale: No hurt Pain Location: Shoulder Pain Orientation: Left Pain Descriptors / Indicators: Aching Pain Frequency: Intermittent Pain Onset: Gradual Pain Intervention(s): Medication (See eMAR) PAINAD (Pain Assessment in Advanced Dementia) Breathing: normal  See FIM for current functional status  Therapy/Group: Individual Therapy  Denzil Hughes 10/14/2013, 4:38 PM

## 2013-10-14 NOTE — Plan of Care (Signed)
Problem: RH PAIN MANAGEMENT Goal: RH STG PAIN MANAGED AT OR BELOW PT'S PAIN GOAL Pain goal less than 3  Outcome: Not Progressing Patient rates pain 6-7. Patient given 5 mg Oxy IR. adm
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
## 2013-10-15 ENCOUNTER — Inpatient Hospital Stay (HOSPITAL_COMMUNITY): Payer: 59 | Admitting: *Deleted

## 2013-10-15 LAB — GLUCOSE, CAPILLARY
Glucose-Capillary: 126 mg/dL — ABNORMAL HIGH (ref 70–99)
Glucose-Capillary: 116 mg/dL — ABNORMAL HIGH (ref 70–99)
Glucose-Capillary: 118 mg/dL — ABNORMAL HIGH (ref 70–99)
Glucose-Capillary: 132 mg/dL — ABNORMAL HIGH (ref 70–99)

## 2013-10-15 MED ORDER — BACLOFEN 10 MG PO TABS
10.0000 mg | ORAL_TABLET | Freq: Every evening | ORAL | Status: DC | PRN
  Administered 2013-10-15 – 2013-10-27 (×18): 10 mg via ORAL
  Filled 2013-10-15 (×26): qty 1

## 2013-10-15 MED ORDER — SENNOSIDES-DOCUSATE SODIUM 8.6-50 MG PO TABS
3.0000 | ORAL_TABLET | Freq: Two times a day (BID) | ORAL | Status: DC
  Administered 2013-10-15 – 2013-10-26 (×21): 3 via ORAL
  Administered 2013-10-26: 2 via ORAL
  Administered 2013-10-27: 1 via ORAL
  Filled 2013-10-15 (×19): qty 3
  Filled 2013-10-15: qty 2
  Filled 2013-10-15 (×5): qty 3
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
## 2013-10-15 NOTE — Progress Notes (Signed)
Physical Therapy Session Note  Patient Details  Name: [name] MRN: [id number] Date of Birth: 11/05/1945  Today's Date: 10/15/2013 PT Individual Time: 1446-1535 PT Individual Time Calculation (min): 49 min   Short Term Goals: Week 2:  PT Short Term Goal 1 (Week 2): Pt will roll R in bed with side rail req mod A.  PT Short Term Goal 2 (Week 2): Pt will roll L in bed with siderail mod I.  PT Short Term Goal 3 (Week 2): Pt will demonstrates side lie to sit transfer req mod A.  PT Short Term Goal 4 (Week 2): Pt will scoot transfer bed to w/c with slideboard req mod A.  PT Short Term Goal 5 (Week 2): Pt will demonstrate w/c propulsion x 25' req max A.   Skilled Therapeutic Interventions/Progress Updates:    Patient received semi-reclined in bed. Session focused on functional transfers, sit<>stand transfers, and B LE NMR. Supine>sit with HOB elevated and use of bed rails with minA, assisted patient donning shoes, slideboard transfer bed>standard wheelchair with minA and set up. Wheelchair mobility 120' x1 with R UE/LE and 54' x1 with B LE for improved reciprocal pattern; intermittent minA for propulsion.   Sit>stand in [place] with modAx2 to immediate high perch sitting in [place]. Patient with reports of lightheadedness, vitals: BP 109/66, HR 101, SpO2 96%. Patient reports lightheadedness decreases with increased time. High perch>stand>sit in wheelchair. Two more trials: sit>stand with modAx2, stand>high perch>stand>sit. Patient spends approx 30-60" in high perch each trial to facilitate increased weight bearing through B LE. With each trial, patient reports less lightheadedness.  Discussion with patient about remaining sitting in standard wheelchair until dinner to increase upright tolerance as well as tolerance to standard wheelchair. Patient discussed how standard wheelchair is not as comfortable as tilt-n-space. Educated patient on benefits of standard wheelchair. Also, switched Vonna Kotyk 2 cushion  from tilt-n-space to standard wheelchair for improved comfort. Cushion is 18" deep and wheelchair is 16" deep, so cushion hangs off edge a bit, but patient reports greatly improved comfort. Patient left sitting in standard wheelchair with seatbelt donned and all needs within reach.  Therapy Documentation Precautions:  Precautions Precautions: Cervical;Fall Precaution Comments: discussed log roll for cervical precautions Required Braces or Orthoses: Cervical Brace;Other Brace/Splint Cervical Brace: Hard collar;At all times Other Brace/Splint: don calf high ACE Wraps and Abd. binder Restrictions Weight Bearing Restrictions: No Pain: Pain Assessment Pain Assessment: No/denies pain Pain Score: 0-No pain Locomotion : Ambulation Ambulation/Gait Assistance: Not tested (comment) Wheelchair Mobility Distance: 120   See FIM for current functional status  Therapy/Group: Individual Therapy  Chipper Herb. Brittnei Jagiello, PT, DPT 10/15/2013, 3:54 PM

## 2013-10-15 NOTE — Plan of Care (Signed)
Problem: SCI BOWEL ELIMINATION Goal: RH STG SCI MANAGE BOWEL WITH MEDICATION WITH ASSISTANCE STG SCI Manage bowel with medication with assistance. maxx  Giving supp. Daily at 0600, per bowel program.

## 2013-10-16 ENCOUNTER — Inpatient Hospital Stay (HOSPITAL_COMMUNITY): Payer: 59

## 2013-10-16 ENCOUNTER — Inpatient Hospital Stay (HOSPITAL_COMMUNITY): Payer: 59 | Admitting: Rehabilitation

## 2013-10-16 ENCOUNTER — Encounter (HOSPITAL_COMMUNITY): Payer: 59 | Admitting: *Deleted

## 2013-10-16 DIAGNOSIS — IMO0002 Reserved for concepts with insufficient information to code with codable children: Secondary | ICD-10-CM

## 2013-10-16 DIAGNOSIS — S069X9A Unspecified intracranial injury with loss of consciousness of unspecified duration, initial encounter: Secondary | ICD-10-CM

## 2013-10-16 DIAGNOSIS — I10 Essential (primary) hypertension: Secondary | ICD-10-CM

## 2013-10-16 DIAGNOSIS — S069XAA Unspecified intracranial injury with loss of consciousness status unknown, initial encounter: Secondary | ICD-10-CM

## 2013-10-16 DIAGNOSIS — D62 Acute posthemorrhagic anemia: Secondary | ICD-10-CM

## 2013-10-16 LAB — GLUCOSE, CAPILLARY
Glucose-Capillary: 133 mg/dL — ABNORMAL HIGH (ref 70–99)
Glucose-Capillary: 123 mg/dL — ABNORMAL HIGH (ref 70–99)
Glucose-Capillary: 130 mg/dL — ABNORMAL HIGH (ref 70–99)
Glucose-Capillary: 139 mg/dL — ABNORMAL HIGH (ref 70–99)
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
## 2013-10-16 NOTE — Progress Notes (Signed)
Physical Therapy Session Note  Patient Details  Name: [name] MRN: [id number] Date of Birth: [birth year]  Today's Date: 10/16/2013 PT Individual Time: 1506-1606 PT Individual Time Calculation (min): 60 min   Short Term Goals: Week 2:  PT Short Term Goal 1 (Week 2): Pt will roll R in bed with side rail req mod A.  PT Short Term Goal 1 - Progress (Week 2): Progressing toward goal PT Short Term Goal 2 (Week 2): Pt will roll L in bed with siderail mod I.  PT Short Term Goal 2 - Progress (Week 2): Met PT Short Term Goal 3 (Week 2): Pt will demonstrates side lie to sit transfer req mod A.  PT Short Term Goal 3 - Progress (Week 2): Met PT Short Term Goal 4 (Week 2): Pt will scoot transfer bed to w/c with slideboard req mod A.  PT Short Term Goal 4 - Progress (Week 2): Met PT Short Term Goal 5 (Week 2): Pt will demonstrate w/c propulsion x 25' req max A.  PT Short Term Goal 5 - Progress (Week 2): Met  Skilled Therapeutic Interventions/Progress Updates:   Pt received sitting in w/c in room, agreeable to therapy session.  Skilled session focused on short distance w/c propulsion for NMR to BLEs prior to standing and also standing/gait activity using sara plus with walking sling.  Also utilized Bioness on L calf and quad for increased DF assist and quad control during stance and swing phase of gait.  Performed two reps of standing x 1-2 mins with use of Bioness for mini squats for increased quad and glute control.  Pt tolerated well and therefore progressed to gait.  Performed 20' x 1 and 31' x 1 with use of Clarise Cruz plus and +2 assist (rehab tech to assist with steering Clarise Cruz plus and another for chair follow).  Pt with great L foot clearance and quad control during gait.  Did provide manual facilitation at hips for slight hip rotation during gait and also provided facilitation for adequate weight shifting.  Pt tolerated all very well with no c/o fatigue or light headedness.  While donning Bioness,  provided questioning regarding pressure relief in standard w/c.  Pt unable to recall how often and how to perform, therefore educated on performing weight shift every 20-30 mins and holding for 2-3 mins.  Also discussed forward trunk lean and lateral leans.  Pt will need to return demonstration to determine adequate understanding.  Pt verbalized understanding.  Pt left in gym for next PT session.    Therapy Documentation Precautions:  Precautions Precautions: Cervical;Fall Precaution Comments: discussed log roll for cervical precautions Required Braces or Orthoses: Cervical Brace;Other Brace/Splint Cervical Brace: Hard collar;At all times Other Brace/Splint: don calf high ACE Wraps and Abd. binder Restrictions Weight Bearing Restrictions: No   Vital Signs: Therapy Vitals Temp: 98.8 F (37.1 C) Temp src: Oral Pulse Rate: 83 Resp: 16 BP: 133/52 mmHg Patient Position (if appropriate): Sitting Oxygen Therapy SpO2: 98 % O2 Device: None (Room air) Pain: Pt with no c/o pain during this session.      Locomotion : Ambulation Ambulation/Gait Assistance: 1: +2 Total assist   See FIM for current functional status  Therapy/Group: Individual Therapy  Denice Bors 10/16/2013, 4:40 PM
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
## 2013-10-16 NOTE — Progress Notes (Signed)
Occupational Therapy Weekly Progress Note  Patient Details  Name: Koy Lamp MRN: [id number] Date of Birth: 04-12-1945  Beginning of progress report period: October 09, 2013 End of progress report period: October 16, 2013  Patient has met 5 of 5 short term goals.  Pt is making excellent progress in treatment, advancing from total dependence with ADL to more active role with self-care due to improved balance, improved activity tolerance, improved functional use of right hand, and improved skill with weight-shifting during assisted transfers.  Patient continues to demonstrate the following deficits: Decreased BLE strength, impaired LUE function, impaired fine motor control of right hand, impaired RUE strength and therefore will continue to benefit from skilled OT intervention to enhance overall performance with BADL.  Patient progressing toward long term goals..  Continue plan of care.  OT Short Term Goals Week 2:  OT Short Term Goal 1 (Week 2): Pt. will feed self using AE with max assist  OT Short Term Goal 1 - Progress (Week 2): Met OT Short Term Goal 2 (Week 2): Pt. will maintain static sitting balance for 10 minutes while sitting on BSC OT Short Term Goal 2 - Progress (Week 2): Met OT Short Term Goal 3 (Week 2): Pt. will bath self with max assist OT Short Term Goal 3 - Progress (Week 2): Met OT Short Term Goal 4 (Week 2): Pt will complete slide board transfer, w/c<>bed, with max assist X 1 OT Short Term Goal 4 - Progress (Week 2): Met OT Short Term Goal 5 (Week 2): Pt will don shirt with mod assist OT Short Term Goal 5 - Progress (Week 2): Met Week 3:  OT Short Term Goal 1 (Week 3): STGs = LTGs   Therapy Documentation Precautions:  Precautions Precautions: Cervical;Fall Precaution Comments: discussed log roll for cervical precautions Required Braces or Orthoses: Cervical Brace;Other Brace/Splint Cervical Brace: Hard collar;At all times Other Brace/Splint: don calf high ACE Wraps  and Abd. binder Restrictions Weight Bearing Restrictions: No  Vital Signs: Therapy Vitals Temp: 98.8 F (37.1 C) Temp src: Oral Pulse Rate: 83 Resp: 16 BP: 133/52 mmHg Patient Position (if appropriate): Sitting Oxygen Therapy SpO2: 98 % O2 Device: None (Room air)  Pain: Pain Assessment Pain Assessment: 0-10 Pain Score:  (Not rated, occasional stiffness/pain in L shoulder)  See FIM for current functional status   Scottlyn Mchaney 10/16/2013, 3:10 PM

## 2013-10-16 NOTE — Progress Notes (Signed)
Physical Therapy Weekly Progress Note  Patient Details  Name: [name] MRN: [id number] Date of Birth: 1945/08/20  Beginning of progress report period: October 07, 2013 End of progress report period: October 16, 2013  Today's Date: 10/16/2013 PT Individual Time: 1001-1101 PT Individual Time Calculation (min): 60 min  Session 2 Time: 9562-1308 Time Calculation (min): 40 min   Patient has met 4 of 5 short term goals. Pt making great gains in upright tolerance, BLE strength, and functional mobility.   Patient continues to demonstrate the following deficits: LUE/LLE weakness, decreased endurance, decreased upright tolerance, decreased postural control and therefore will continue to benefit from skilled PT intervention to enhance overall performance with activity tolerance, postural control, ability to compensate for deficits and functional use of  right lower extremity and left lower extremity.  Patient progressing toward long term goals..  Continue plan of care.  PT Short Term Goals Week 2:  PT Short Term Goal 1 (Week 2): Pt will roll R in bed with side rail req mod A.  PT Short Term Goal 1 - Progress (Week 2): Met PT Short Term Goal 2 (Week 2): Pt will roll L in bed with siderail mod I.  PT Short Term Goal 2 - Progress (Week 2): Progressing toward goal PT Short Term Goal 3 (Week 2): Pt will demonstrates side lie to sit transfer req mod A.  PT Short Term Goal 3 - Progress (Week 2): Met PT Short Term Goal 4 (Week 2): Pt will scoot transfer bed to w/c with slideboard req mod A.  PT Short Term Goal 4 - Progress (Week 2): Met PT Short Term Goal 5 (Week 2): Pt will demonstrate w/c propulsion x 25' req max A.  PT Short Term Goal 5 - Progress (Week 2): Met Week 3:  PT Short Term Goal 1 (Week 3): Pt will transfer bed<>w/c w/ S w/o sliding board consistently PT Short Term Goal 2 (Week 3): Pt will roll R w/ MinA consistently PT Short Term Goal 3 (Week 3): Pt will propel w/c 100' w/ BLE and RUE  consistently PT Short Term Goal 4 (Week 3): Pt will tolerate standing w/ therapy for 1 minute without   Skilled Therapeutic Interventions/Progress Updates:    Session 1: Pt received seated in standard wheelchair, agreeable to participate in therapy. Session focused on standing tolerance, bed mobility, LLE ROM/strengthening. Pt propelled w/c 60' in hospital hallway w/ BLE and RUE w/ supervision (pt able to place L foot for propulsion w/o assist, improvement from propulsion last week). Pt came to standing w/ STEDI lift w/ MinA of 2, maintained standing for 30 seconds before sitting on high perch. Pt reported some dizziness/lightheadedness after standing, no significant change in BP from sitting. While resting pt reported he had moved bowels while standing. High perch>stand>sit in w/c w/ MinA-ModA for all. TotalA for propulsion back to room. Pt transferred w/c>bed lateral scoot transfer w/ MinA for keeping feet planted. Pt w/ MaxA to move hips up in bed, then ModA to roll R, (S) to roll L several times in order to doff brief, clean up bowel movement. Therapist provided LLE stretching into hip/knee flexion w/ PNF techniques for LE strengthening and ROM. Pt left supine in bed w/ all needs within reach.  Session 2: Pt received handoff from PT [facility], pt walking w/ SARA Plus lift and walking sling upon arrival. Per PT pt walked twice (~20 feet and 30 feet). Pt ambulated 20', then 25', then 20' w/ SARA Plus in walking sling,  knee blocker plate removed, Bioness on gait setting for L quad stimulation during stance phase and L tib anterior stimulation during swing phase (see Bioness unit for settings) w/ ModA from therapist to advance L limb. Extended rest breaks between each bout of ambulation. Pt reports he was feeling tired, but "not wiped out" and was not experiencing any dizziness or lightheadedness. Pt propelled w/c 88' w/ S and 2 short rest breaks w/ BLE/RUE. Pt left seated in w/c w/ pillow under LUE and behind  back for comfort w/ all needs within reach.   Therapy Documentation Precautions:  Precautions Precautions: Cervical;Fall Precaution Comments: discussed log roll for cervical precautions Required Braces or Orthoses: Cervical Brace;Other Brace/Splint Cervical Brace: Hard collar;At all times Other Brace/Splint: don calf high ACE Wraps and Abd. binder Restrictions Weight Bearing Restrictions: No General:   Vital Signs: Therapy Vitals Temp: 98.9 F (37.2 C) Temp src: Oral Pulse Rate: 82 Resp: 18 BP: 129/79 mmHg Patient Position (if appropriate): Lying Oxygen Therapy SpO2: 96 % O2 Device: None (Room air) Pain: Pain Assessment Pain Assessment: 0-10 Pain Score: Asleep Pain Type: Acute pain Pain Location: Shoulder Pain Orientation: Right Pain Descriptors / Indicators: Spasm Pain Frequency: Intermittent Pain Onset: On-going Pain Intervention(s): Medication (See eMAR) Multiple Pain Sites: No  See FIM for current functional status  Therapy/Group: Individual Therapy  Rada Hay Rada Hay, PT, DPT 10/16/2013, 7:48 AM

## 2013-10-17 ENCOUNTER — Inpatient Hospital Stay (HOSPITAL_COMMUNITY): Payer: 59

## 2013-10-17 ENCOUNTER — Inpatient Hospital Stay (HOSPITAL_COMMUNITY): Payer: 59 | Admitting: Physical Therapy

## 2013-10-17 ENCOUNTER — Encounter (HOSPITAL_COMMUNITY): Payer: 59 | Admitting: *Deleted

## 2013-10-17 ENCOUNTER — Encounter (HOSPITAL_COMMUNITY): Payer: 59

## 2013-10-17 DIAGNOSIS — S069X9A Unspecified intracranial injury with loss of consciousness of unspecified duration, initial encounter: Secondary | ICD-10-CM

## 2013-10-17 DIAGNOSIS — IMO0002 Reserved for concepts with insufficient information to code with codable children: Secondary | ICD-10-CM

## 2013-10-17 DIAGNOSIS — S069XAA Unspecified intracranial injury with loss of consciousness status unknown, initial encounter: Secondary | ICD-10-CM

## 2013-10-17 DIAGNOSIS — I10 Essential (primary) hypertension: Secondary | ICD-10-CM

## 2013-10-17 DIAGNOSIS — D62 Acute posthemorrhagic anemia: Secondary | ICD-10-CM

## 2013-10-17 LAB — GLUCOSE, CAPILLARY
Glucose-Capillary: 142 mg/dL — ABNORMAL HIGH (ref 70–99)
Glucose-Capillary: 126 mg/dL — ABNORMAL HIGH (ref 70–99)
Glucose-Capillary: 87 mg/dL (ref 70–99)
Glucose-Capillary: 127 mg/dL — ABNORMAL HIGH (ref 70–99)
Glucose-Capillary: 126 mg/dL — ABNORMAL HIGH (ref 70–99)

## 2013-10-17 NOTE — Progress Notes (Signed)
Occupational Therapy Session Note  Patient Details  Name: [name] MRN: [id number] Date of Birth: February 11, 1946  Today's Date: 10/17/2013 OT Individual Time: 1000-1100 OT Individual Time Calculation (min): 60 min   Short Term Goals: Week 2:  OT Short Term Goal 1 (Week 2): Pt. will feed self using AE with max assist  OT Short Term Goal 2 (Week 2): Pt. will maintain static sitting balance for 10 minutes while sitting on BSC OT Short Term Goal 3 (Week 2): Pt. will bath self with max assist OT Short Term Goal 4 (Week 2): Pt will complete slide board transfer, w/c<>bed, with max assist X 1 OT Short Term Goal 5 (Week 2): Pt will don shirt with mod assist  Skilled Therapeutic Interventions/Progress Updates:    Pt seated EOB upon arrival.  Pt engaged in bed mobility, dressing, sit<>stand, and transfers.  Focus on static and dynamic sitting balance unsupported, directing care, and safety awareness.  Pt performed sit->supine with supervision and supine->sit with min A to initiate pushing up from sidelying.  Pt required mod A to initiate sit<>stand and min a while standing to facilitate pulling up pants.  Pt required mod A for scoot/squat pivot transfer to w/c.  Pt propelled w/c to nurses station and back and remained in w/c with all needs within reach.  Therapy Documentation Precautions:  Precautions Precautions: Cervical;Fall Precaution Comments: discussed log roll for cervical precautions Required Braces or Orthoses: Cervical Brace;Other Brace/Splint Cervical Brace: Hard collar;At all times Other Brace/Splint: don calf high ACE Wraps and Abd. binder Restrictions Weight Bearing Restrictions: No Pain: Pain Assessment Pain Score: 5   See FIM for current functional status  Therapy/Group: Individual Therapy  Rich Brave 10/17/2013, 12:09 PM
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
## 2013-10-17 NOTE — Progress Notes (Signed)
Physical Therapy Session Note  Patient Details  Name: [name] MRN: [id number] Date of Birth: 08/26/1945  Today's Date: 10/17/2013 PT Individual Time: 1402-1410 PT Individual Time Calculation (min): 8 min   Short Term Goals: Week 3:  PT Short Term Goal 1 (Week 3): Pt will transfer bed<>w/c w/ S w/o sliding board consistently PT Short Term Goal 2 (Week 3): Pt will roll R w/ MinA consistently PT Short Term Goal 3 (Week 3): Pt will propel w/c 100' w/ BLE and RUE consistently PT Short Term Goal 4 (Week 3): Pt will tolerate standing w/ therapy for 1 minute without   Skilled Therapeutic Interventions/Progress Updates:   Pt received semi reclined in bed, wife at bedside. Pt with increased nausea and reporting feeling ill, unable to eat lunch. Offered to assist pt to sitting EOB to attempt eating but pt declined. Pt noted to be clammy and warm, provided damp washcloth and RN tech notified to take blood glucose. Pt declining bedside therapy at this time due to feeling ill. Followed up with patient and reporting low blood glucose, able to eat a cookie and feeling better but deferring therapy at this time in order to participate in 60 minute therapy session at 4 pm. Will f/u per POC.     Therapy Documentation Precautions:  Precautions Precautions: Cervical;Fall Precaution Comments: discussed log roll for cervical precautions Required Braces or Orthoses: Cervical Brace;Other Brace/Splint Cervical Brace: Hard collar;At all times Other Brace/Splint: don calf high ACE Wraps and Abd. binder Restrictions Weight Bearing Restrictions: No General: PT Amount of Missed Time (min): 52 Minutes PT Missed Treatment Reason: Patient ill (Comment) (nausea) Vital Signs: Therapy Vitals Temp: 98.2 F (36.8 C) Temp src: Oral Pain:  Denied pain  See FIM for current functional status  Therapy/Group: Individual Therapy  Kerney Elbe 10/17/2013, 2:28 PM

## 2013-10-17 NOTE — Progress Notes (Signed)
Physical Therapy Session Note  Patient Details  Name: [name] MRN: [id number] Date of Birth: Dec 07, 1945  Today's Date: 10/17/2013 PT Individual Time: 0905-1000 PT Individual Time Calculation (min): 55 min  Session 2 Time: 1600-1700 Time Calculation (min) 60 min  Short Term Goals: Week 3:  PT Short Term Goal 1 (Week 3): Pt will transfer bed<>w/c w/ S w/o sliding board consistently PT Short Term Goal 2 (Week 3): Pt will roll R w/ MinA consistently PT Short Term Goal 3 (Week 3): Pt will propel w/c 100' w/ BLE and RUE consistently PT Short Term Goal 4 (Week 3): Pt will tolerate standing w/ therapy for 1 minute without   Skilled Therapeutic Interventions/Progress Updates:    Session 1: Pt received supine in bed, agreeable to participate in therapy. Pt reported increased fatigue this AM after yesterday's therapy. Session focused on bed level BLE strengthening/NMR, functional mobility, pain management. Therapist w/ passive and active assist stretching for BLE hip/knee flexion and dorsiflexion. LLE in PNF D1 pattern w/ rhythmic initiation, minimal resistance into flexion, moderate resistance into extension for improved initiation and motor control. Trigger point massage in L upper/mid trap for pain management. 2x10 supine hip abd and assisted heel slides BLE. Pt moved supine <> sit to L w/ MinA for managing LLE. Pt left supine in bed, handoff to COTA.  Session 2: Pt received supine in bed, agreeable to participate in therapy. Session focused on functional mobility, NMR for BLE, standing tolerance. Pt moved supine>sit w/ close S. MaxA for donning shoes while seated EOB. Pt w/ scoot transfer bed>w/c to R w/ MinA. Pt propelled w/c 50'x2 w/ MinA, initially w/ BLE/RUE then w/ RUE/RLE. In rehab gym pt completed seated hip extension 2x3' on Kinetron machine for BLE NMR w/ rest break in between bouts, resistance set at 20 cm/sec. Pt transferred w/c<>mat table w/ MinA scoot transfer, pt does not require VC's  for sequencing. Pt performed x3 sit<>stands w/ EVA walker from elevated mat, all stands w/ Min Assist, manual block of L knee, no electrical stimulation. Pt very satisfied by progress. Pt propelled w/c 50' w/ RUE/RLE and S. Pt left seated in w/c w/ all needs within reach.   Therapy Documentation Precautions:  Precautions Precautions: Cervical;Fall Precaution Comments: discussed log roll for cervical precautions Required Braces or Orthoses: Cervical Brace;Other Brace/Splint Cervical Brace: Hard collar;At all times Other Brace/Splint: don calf high ACE Wraps and Abd. binder Restrictions Weight Bearing Restrictions: No General: PT Amount of Missed Time (min): 52 Minutes PT Missed Treatment Reason: Patient ill (Comment) (nausea) Vital Signs: Therapy Vitals Temp: 98.2 F (36.8 C) Temp src: Oral See FIM for current functional status  Therapy/Group: Individual Therapy  Hosie Spangle Hosie Spangle, PT, DPT 10/17/2013, 3:11 PM

## 2013-10-18 ENCOUNTER — Inpatient Hospital Stay (HOSPITAL_COMMUNITY): Payer: 59 | Admitting: Occupational Therapy

## 2013-10-18 ENCOUNTER — Inpatient Hospital Stay (HOSPITAL_COMMUNITY): Payer: 59

## 2013-10-18 ENCOUNTER — Encounter (HOSPITAL_COMMUNITY): Payer: 59 | Admitting: Occupational Therapy

## 2013-10-18 ENCOUNTER — Encounter (HOSPITAL_COMMUNITY): Payer: 59 | Admitting: *Deleted

## 2013-10-18 DIAGNOSIS — S069XAA Unspecified intracranial injury with loss of consciousness status unknown, initial encounter: Secondary | ICD-10-CM

## 2013-10-18 DIAGNOSIS — IMO0002 Reserved for concepts with insufficient information to code with codable children: Secondary | ICD-10-CM

## 2013-10-18 DIAGNOSIS — D62 Acute posthemorrhagic anemia: Secondary | ICD-10-CM

## 2013-10-18 DIAGNOSIS — S069X9A Unspecified intracranial injury with loss of consciousness of unspecified duration, initial encounter: Secondary | ICD-10-CM

## 2013-10-18 DIAGNOSIS — I10 Essential (primary) hypertension: Secondary | ICD-10-CM

## 2013-10-18 LAB — GLUCOSE, CAPILLARY
Glucose-Capillary: 98 mg/dL (ref 70–99)
Glucose-Capillary: 120 mg/dL — ABNORMAL HIGH (ref 70–99)
Glucose-Capillary: 117 mg/dL — ABNORMAL HIGH (ref 70–99)
Glucose-Capillary: 72 mg/dL (ref 70–99)
Glucose-Capillary: 143 mg/dL — ABNORMAL HIGH (ref 70–99)

## 2013-10-18 NOTE — Progress Notes (Signed)
Occupational Therapy Weekly Progress Note  Patient Details  Name: [name] MRN: [id number] Date of Birth: 08-Jan-1946  Beginning of progress report period: October 09, 2013 End of progress report period: October 18, 2013  Today's Date: 10/18/2013  Patient has met 5 of 5 short term goals.  Patient is making good, steady progress on CIR. Patient continues to require total assist for LB ADLs due to decreased strength and increased stiffness/tightness>BLEs & BUEs. Patient is now able to stand to complete LB bathing & dressing. Patient is able to don shirt with minimal assistance either sitting EOB or supported in w/c. Patient is able to perform transfers with min>mod assist (squat pivot technique). Plan is for patient to discharge 10/27/13 > home with wife. LTGs set at an overall min assist level; continue plan of care for now.   Patient continues to demonstrate the following deficits: decreased functional use of BUEs/hands, decreased independence with functional mobility/transfers, decreased independence with BADLs/IADLs, decreased overall activity tolerance/endurance, increased pain in LUE. Therefore, patient will continue to benefit from skilled OT intervention to enhance overall performance with BADL, iADL and Reduce care partner burden.  Patient progressing toward long term goals..  Continue plan of care.  OT Short Term Goals Week 1:  OT Short Term Goal 1 (Week 1): Pt. will drink from cup with straw with mod assist OT Short Term Goal 1 - Progress (Week 1): Met OT Short Term Goal 2 (Week 1): Pt. will feed self with AE with max assist  OT Short Term Goal 2 - Progress (Week 1): Progressing toward goal OT Short Term Goal 3 (Week 1): Pt. will maintain static sitting balance for 10 minutes EOB OT Short Term Goal 3 - Progress (Week 1): Progressing toward goal OT Short Term Goal 4 (Week 1): Pt. will bath self with max assist OT Short Term Goal 4 - Progress (Week 1): Progressing toward goal  Week  2:  OT Short Term Goal 1 (Week 2): Pt. will feed self using AE with max assist  OT Short Term Goal 1 - Progress (Week 2): Met OT Short Term Goal 2 (Week 2): Pt. will maintain static sitting balance for 10 minutes while sitting on BSC OT Short Term Goal 2 - Progress (Week 2): Met OT Short Term Goal 3 (Week 2): Pt. will bath self with max assist OT Short Term Goal 3 - Progress (Week 2): Met OT Short Term Goal 4 (Week 2): Pt will complete slide board transfer, w/c<>bed, with max assist X 1 OT Short Term Goal 4 - Progress (Week 2): Met OT Short Term Goal 5 (Week 2): Pt will don shirt with mod assist OT Short Term Goal 5 - Progress (Week 2): Met  Week 3:  OT Short Term Goal 1 (Week 3): STGs = LTGs  Skilled Therapeutic Interventions/Progress Updates:  Balance/vestibular training;Cognitive remediation/compensation;Discharge planning;Community reintegration;Disease mangement/prevention;DME/adaptive equipment instruction;Functional electrical stimulation;Functional mobility training;Neuromuscular re-education;Pain management;Patient/family education;Psychosocial support;Self Care/advanced ADL retraining;Skin care/wound managment;Splinting/orthotics;Therapeutic Activities;Therapeutic Exercise;UE/LE Strength taining/ROM;UE/LE Coordination activities;Wheelchair propulsion/positioning   Precautions:  Precautions Precautions: Cervical;Fall Precaution Comments: discussed log roll for cervical precautions Required Braces or Orthoses: Cervical Brace;Other Brace/Splint Cervical Brace: Hard collar;At all times Other Brace/Splint: don calf high ACE Wraps and Abd. binder Restrictions Weight Bearing Restrictions: No  Ester Hilley 10/18/2013, 12:17 PM
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
## 2013-10-18 NOTE — Progress Notes (Signed)
Social Work Patient ID: [name], male   DOB: 01/31/1946, 67 y.o.   MRN: [id number]  CSW met with pt, pt's wife, and his son, Brad to update them on team conference discussion.  Pt was pleased to learn that he is on target for 10-27-13 d/c date.  Pt and wife had questions about if/when pt would need surgery.  CSW explained that Dr. Swartz stated in conference that we would not know until closer to d/c date.  They plan to f/u with him about this next week.  Pt also had questions about insurance coverage of DME.  CSW answered these and assured pt/family that CSW would help obtain DME and arrange f/u therapies next week.  Pt continues to be so appreciative of CIR and he is so pleased with his progression.  

## 2013-10-18 NOTE — Plan of Care (Signed)
Problem: SCI BOWEL ELIMINATION Goal: RH STG MANAGE BOWEL WITH ASSISTANCE STG Manage Bowel with Assistance. Min  Outcome: Not Progressing Requiring bowel program with dig stim
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
## 2013-10-18 NOTE — Progress Notes (Signed)
Physical Therapy Session Note  Patient Details  Name: [name] MRN: [id number] Date of Birth: [birth year]  Today's Date: 10/18/2013 PT Individual Time: 0900-1000 PT Individual Time Calculation (min): 60 min  Session 2 Time: 1400-1500 Time Calculation (min): 60 min   Short Term Goals: Week 3:  PT Short Term Goal 1 (Week 3): Pt will transfer bed<>w/c w/ S w/o sliding board consistently PT Short Term Goal 2 (Week 3): Pt will roll R w/ MinA consistently PT Short Term Goal 3 (Week 3): Pt will propel w/c 100' w/ BLE and RUE consistently PT Short Term Goal 4 (Week 3): Pt will tolerate standing w/ therapy for 1 minute without   Skilled Therapeutic Interventions/Progress Updates:    Session 1: Pt received supine in bed finishing breakfast, agreeable to participate in therapy. Session focused on bed mobility, pressure relief education, BLE strengthening. Pt moved supine <> sit on both sides of bed, overall MinA to close supervision depending on fatigue. Pt requires most assist when moving sidelying>sit, especially when on L side due to decreased strength in LUE. Therapist noted increased motor return in LLE leading to decreased assist for bed mobility. Pt w/ some lightheadedness initially after first supine>sit, then no problems. Pt completed x10 hip flexion/extension on RLE w/ minimal resistance for flexion and moderate resistance for extension. x10 hip flexion/extension on LLE w/ MinA for hip flexion and minimal resistance for hip extension. 2x10 LLE supine hip abduction for improved ability to manage legs in/out of bed. Pt transferred bed>w/c w/ MinGuard A, lateral scoot transfer to R. Pt demonstrated effective pressure relief w/ forward and lateral leans. Pt propelled w/c around room in order to position w/c next to bed w/ supervision. Pt left seated in w/c w/ all needs within reach.  Session 2: Pt received supine in bed, agreeable to participate in therapy. Pt moved supine>sit w/ supervision,  directed therapist to setup w/c for transfer, and performed scoot transfer bed>w/c w/ MinA. Pt propelled w/c w/ MinA w/ RUE/RLE. Scoot transfer w/c>mat table w/ MinA. Pt performed x2 sit<>stands from elevated mat table (elevated until w/ feet flat on floor pt in approximately 45 degrees hip flexion. Pt tolerated standing for 30-45 seconds each time. Pt w/ complaints of lightheadedness and L shoulder pain after standing. BP at 104/60. Extended seated rest break and trigger point work in L upper trap/mid trap/rhomboids for pain control. Pt w/ lateral scoot transfer mat>w/c w/ ModA, reported "My legs are tired" Pt left seated in chair, handoff to OT.   Therapy Documentation Precautions:  Precautions Precautions: Cervical;Fall Precaution Comments: discussed log roll for cervical precautions Required Braces or Orthoses: Cervical Brace;Other Brace/Splint Cervical Brace: Hard collar;At all times Other Brace/Splint: don calf high ACE Wraps and Abd. binder Restrictions Weight Bearing Restrictions: No General:   Vital Signs:   Pain: Pain Assessment Pain Assessment: 0-10 Pain Score: 4  Mobility:   Locomotion :    Trunk/Postural Assessment :    Balance:   Exercises:   Other Treatments:    See FIM for current functional status  Therapy/Group: Individual Therapy  Hosie Spangle Hosie Spangle, PT, DPT 10/18/2013, 12:24 PM

## 2013-10-18 NOTE — Progress Notes (Signed)
Occupational Therapy Session Note  Patient Details  Name: [name] MRN: [id number] Date of Birth: 06/18/1945  Today's Date: 10/18/2013 OT Individual Time: 1100-1200 OT Individual Time Calculation (min): 60 min  Short Term Goals: Week 1:  OT Short Term Goal 1 (Week 1): Pt. will drink from cup with straw with mod assist OT Short Term Goal 1 - Progress (Week 1): Met OT Short Term Goal 2 (Week 1): Pt. will feed self with AE with max assist  OT Short Term Goal 2 - Progress (Week 1): Progressing toward goal OT Short Term Goal 3 (Week 1): Pt. will maintain static sitting balance for 10 minutes EOB OT Short Term Goal 3 - Progress (Week 1): Progressing toward goal OT Short Term Goal 4 (Week 1): Pt. will bath self with max assist OT Short Term Goal 4 - Progress (Week 1): Progressing toward goal  Week 2:  OT Short Term Goal 1 (Week 2): Pt. will feed self using AE with max assist  OT Short Term Goal 2 (Week 2): Pt. will maintain static sitting balance for 10 minutes while sitting on BSC OT Short Term Goal 3 (Week 2): Pt. will bath self with max assist OT Short Term Goal 4 (Week 2): Pt will complete slide board transfer, w/c<>bed, with max assist X 1 OT Short Term Goal 5 (Week 2): Pt will don shirt with mod assist  Skilled Therapeutic Interventions/Progress Updates:  Patient received seated in w/c. During session, focused skilled intervention on trigger point release > LUE(shoudler), AAROM > LUE, education on self AAROM exercises > LUE, UB/LB dressing, toilet transfer, toileting, sit<>stands, and overall activity tolerance/endurance. Patient completed UB dressing seated in w/c with min assist, needed assistance with getting shirt over head. Therapist administered elastic shoe strings > bilateral shoes in order to help increase independence with LB dressing. Patient with bowel/bladder urgency and requested to use bathroom. Therapist propelled patient into bathroom and patient transferred w/c>elevated  toilet seat with moderate assistance. Therapist doffed brief and patient with urine incontinence. During session, patient kept saying "this is the first time I've done it like this", talking about chair level ADL tasks and toilet transfer in BR. Patient stayed on toilet seat secondary to he felt like he still needed to have bowel movement. Notified NT of patient's whereabouts. Patient's wife present at end of session. Talked with patient and wife on importance of calling for assistance when finished using bathroom.   Precautions:  Precautions Precautions: Cervical;Fall Precaution Comments: discussed log roll for cervical precautions Required Braces or Orthoses: Cervical Brace;Other Brace/Splint Cervical Brace: Hard collar;At all times Other Brace/Splint: don calf high ACE Wraps and Abd. binder Restrictions Weight Bearing Restrictions: No  See FIM for current functional status  Therapy/Group: Individual Therapy  Gotham Raden 10/18/2013, 12:07 PM

## 2013-10-19 ENCOUNTER — Inpatient Hospital Stay (HOSPITAL_COMMUNITY): Payer: 59 | Admitting: Physical Therapy

## 2013-10-19 ENCOUNTER — Encounter (HOSPITAL_COMMUNITY): Payer: 59 | Admitting: *Deleted

## 2013-10-19 ENCOUNTER — Inpatient Hospital Stay (HOSPITAL_COMMUNITY): Payer: 59

## 2013-10-19 LAB — GLUCOSE, CAPILLARY
Glucose-Capillary: 119 mg/dL — ABNORMAL HIGH (ref 70–99)
Glucose-Capillary: 137 mg/dL — ABNORMAL HIGH (ref 70–99)
Glucose-Capillary: 170 mg/dL — ABNORMAL HIGH (ref 70–99)
Glucose-Capillary: 95 mg/dL (ref 70–99)

## 2013-10-19 NOTE — Progress Notes (Signed)
Occupational Therapy Session Note  Patient Details  Name: [name] MRN: [id number] Date of Birth: 01/25/1946  Today's Date: 10/18/2013 OT Individual Time: 1500-1600 OT Individual Time Calculation (min): 60 min   Short Term Goals: Week 3:  OT Short Term Goal 1 (Week 3): STGs = LTGs  Skilled Therapeutic Interventions/Progress Updates:    1:1 Neuro muscular reeducation with focus on releasing trigger points on left shoulder/ scapular region for pain relief and muscle relaxation. In sidelying focus on scapular prortaction and retraction, elevation and depression, and functional reach, wrist extension and flexion and grasp and release and ROM for edema control. Pt required mod A and tactile cues for normal patterns of movement.  Self care retraining: focus on slide board and scoot pivot transfers with ability to direct therapist in assisting with w/c and slide board setup up. Sit to slidelying on right side with max A for assistance with bilateral LEs on to mat. Sidelying to sitting EOM with mod A (A for left LE off mat and min A for trunk support to come into sitting position with extra time. Performed sit to stand from slightly elevated mat and then another sit to stand lowering the mat 1 inch with min A with Carley Hammed walker in front of pt fo UE support simulating sit to stands in prep for clothing management with toileting and dressing. Pt able to transition to using right UE to push up from the mat into standing and then with A bringing the right UE onto the platform of EVA walker. Pt able to maintain erect posture for 10-15 sec at a time with verbal and tactile cues for trunk extension and tightening gluts. Applied heat to left shoulder at conclusion of session for comfort. Pt left in w/c with call bell in lap at conclusion of session.   Therapy Documentation Precautions:  Precautions Precautions: Cervical;Fall Precaution Comments: discussed log roll for cervical precautions Required Braces or  Orthoses: Cervical Brace;Other Brace/Splint Cervical Brace: Hard collar;At all times Other Brace/Splint: don calf high ACE Wraps and Abd. binder Restrictions Weight Bearing Restrictions: No Pain: 8/10 requested meds- RN came and administered.  See FIM for current functional status  Therapy/Group: Individual Therapy  Roney Mans [facility] 10/19/2013, 7:54 AM

## 2013-10-19 NOTE — Progress Notes (Signed)
Physical Therapy Session Note  Patient Details  Name: [name] MRN: [id number] Date of Birth: 05-07-1945  Today's Date: 10/19/2013 PT Co-Treatment Time: 1430-1500 PT Co-Treatment Time Calculation (min): 30 min  Short Term Goals: Week 3:  PT Short Term Goal 1 (Week 3): Pt will transfer bed<>w/c w/ S w/o sliding board consistently PT Short Term Goal 2 (Week 3): Pt will roll R w/ MinA consistently PT Short Term Goal 3 (Week 3): Pt will propel w/c 100' w/ BLE and RUE consistently PT Short Term Goal 4 (Week 3): Pt will tolerate standing w/ therapy for 1 minute without      Skilled Therapeutic Interventions/Progress Updates:  Co-treatment with OT for skilled +2 transfers and balance activities; see OT note for full information.  Active assistive LLE ROM including grade I mobilizations hip for joint flexibility.    W/c propulsion using RUE and RLE x 150' with supervision; locked R brake with 1 VC, and L brake with tactile cue and slight overpressure to hold hand on brake.  Therapeutic activity sitting on EOM, focusing on good LE alignment to provide sitting stability: using R hand , pt reached across midline x 10 and forward and to R x 5 to grasp horseshoes and toss to target 6' away. Pt demonstrated functional anterior pelvic tilt and brief back extension with each toss of a horseshoe. For L shoulder trigger points/muscle tightness, moist heat pack applied x 20 minutes to L scapular area with good result.    To await next therapy, pt moved into R sidelying with mod assist for bil LEs, propped on wedge and with other positioning devices to accommodate tight L IT band limiting L hip adduction.     Therapy Documentation Precautions:  Precautions Precautions: Cervical;Fall Precaution Comments: discussed log roll for cervical precautions Required Braces or Orthoses: Cervical Brace;Other Brace/Splint Cervical Brace: Hard collar;At all times Other Brace/Splint: don calf high ACE Wraps and  Abd. binder Restrictions Weight Bearing Restrictions: No   Locomotion : Wheelchair Mobility Distance: 150     See FIM for current functional status  Therapy/Group: Individual Therapy  Jaice Lague 10/19/2013, 3:36 PM

## 2013-10-19 NOTE — Progress Notes (Signed)
Occupational Therapy Session Note  Patient Details  Name: [name] MRN: [id number] Date of Birth: [birth year]  Today's Date: 10/19/2013 OT Individual Time: 1000-1100 OT Individual Time Calculation (min): 60 min   Short Term Goals: Week 3:  OT Short Term Goal 1 (Week 3): STGs = LTGs  Skilled Therapeutic Interventions/Progress Updates: ADL-retraining with focus on improved functional use of BUE during bathing/dressing tasks.   Pt received seated in his w/c, receptive to planned activity to shower-level ADL (head to toe).   Pt required extra time and mod assist to transfer from w/c to tub bench in walk-in shower using grab bar although contributing approx 75% during transfer.  Pt sustained supported sitting at bench for > 30 min during bathing w/o evidence of fatigue or loss of stability and good awareness of limits of stability while attempting to wash upper legs.   OT provided total assist to gently wash and rinse pt's head near wound during this session.    Pt completed mech assist transfer using Corene Cornea from tub bench in shower to edge of bed where he was assisted with dressing (max assist) due to time limitations.   Pt left supine in bed with RN attending to wound care at end of session.    Therapy Documentation Precautions:  Precautions Precautions: Cervical;Fall Precaution Comments: discussed log roll for cervical precautions Required Braces or Orthoses: Cervical Brace;Other Brace/Splint Cervical Brace: Hard collar;At all times Other Brace/Splint: don calf high ACE Wraps and Abd. binder Restrictions Weight Bearing Restrictions: No  Pain: Pain Assessment Pain Assessment: No/denies pain Pain Score: 0-No pain Pain Type: Acute pain Pain Location: Shoulder Pain Orientation: Right;Left Pain Descriptors / Indicators: Aching Pain Onset: On-going Patients Stated Pain Goal: 4 Pain Intervention(s): Repositioned  See FIM for current functional status  Therapy/Group: Individual  Therapy  Brynnlie Unterreiner 10/19/2013, 11:12 AM

## 2013-10-19 NOTE — Progress Notes (Signed)
Physical Therapy Session Note  Patient Details  Name: [name] MRN: [id number] Date of Birth: 1945/07/08  Today's Date: 10/19/2013 PT Individual Time: 0900-1000 Tx 2: 1500-1600 PT Individual Time Calculation (min): 60 min Tx 2: 60 min  Short Term Goals: Week 3:  PT Short Term Goal 1 (Week 3): Pt will transfer bed<>w/c w/ S w/o sliding board consistently PT Short Term Goal 2 (Week 3): Pt will roll R w/ MinA consistently PT Short Term Goal 3 (Week 3): Pt will propel w/c 100' w/ BLE and RUE consistently PT Short Term Goal 4 (Week 3): Pt will tolerate standing w/ therapy for 1 minute without   Skilled Therapeutic Interventions/Progress Updates:    Therapeutic Exercise: PT instructs pt in strengthening and ROM exercises, ankle pumps x 20 reps, L heel cord stretch x 1 minute, min-mod resisted heel slides R LE, AAROM L heel side with min-mod resisted hip extension, hamstring stretch x 1 minute bilaterally, hip IR in B hook lie with PT stabilizing B feet x 10 reps, bridges with PT stabilizing B feet and L knee (partial ROM - pt denies neck pain with this exercise) x 10 reps, SLR - AAROM L LE and AROM R LE x 10 reps, B clam shells in side lie x 10 reps, quadriceps stretch x 1 minute in side lie, R hip abduction in side lie: all done 2 sets  Therapeutic Activity: PT instructs pt in scooting laterally in supine while PT stabilizes pt's B LEs req mod A, rolling R without rail req min-mod A, in rolling L without rail req SBA, L side lie to sit without rail req mod A at trunk and slight assist for L LE off of bed, scoot transfer to R without slideboard req min A and verbal cues to use hands.   Session 2: Therapeutic Activity: Pt received from OT/PT co-treatment session with pt reporting high levels of pain in L shoulder. PT applied moist heat while pt in partial R side lie position on mat and instructed pt in edema reduction of B hands: fist pumps with PT assisting L hand in elevation 2 x 20 reps. PT  instructs pt in working on mat mobility, break down the movements for supine to sit: reaching L arm towards edge of mat and bending L knee towards edge of mat x 10 reps req min A, kicking R leg off and placing it back on mat x 10 reps, kicking L leg off and placing it back on mat x 10 reps and min A for placing it back on mat, pushing up to sit edge of mat req min-mod A, leaning on R elbow and pushing all the way up to sit x 10 reps  req SBA.  PT then instructs pt in sit to stand without AD from elevated mat, progressively lowering it after each stand x 10 reps req CGA-min A, with focus on standing as long as possible after each stand. Pt achieves 30 seconds on best attempt.  PT instructs pt in stand pivot transfer without AD from mat to w/c to R req min-mod A.   W/C Management: PT instructs pt in w/c propulsion using B LEs req min A for momentum and extremely slow pace x 55'.   Pt is continuing to progress with physical therapy. Pt will benefit from specific bed mobility training and continued progress towards stand-step transfers from bed to/from w/c, as well as ambulation with AD, as pt's L shoulder pain allows it, as well as progression towards  independence in w/c propulsion.   Therapy Documentation Precautions:  Precautions Precautions: Cervical;Fall Precaution Comments: discussed log roll for cervical precautions Required Braces or Orthoses: Cervical Brace;Other Brace/Splint Cervical Brace: Hard collar;At all times Other Brace/Splint: don calf high ACE Wraps and Abd. binder Restrictions Weight Bearing Restrictions: No Pain: Pain Assessment Pain Assessment: No/denies pain Pain Score: 0-No pain Pain Type: Acute pain Pain Location: Shoulder Pain Orientation: Right;Left Pain Descriptors / Indicators: Aching Pain Onset: On-going Patients Stated Pain Goal: 4 Pain Intervention(s): Repositioned Session 2: Pt c/o 8/10 pain in L shoulder and PT utilizes moist heat to shoulder to decrease  pain.  See FIM for current functional status  Therapy/Group: Individual Therapy  Lewie Deman M 10/19/2013, 9:08 AM
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
## 2013-10-19 NOTE — Progress Notes (Signed)
Occupational Therapy Session Note  Patient Details  Name: [name] MRN: [id number] Date of Birth: 1945-10-04  Today's Date: 10/19/2013 OT Individual Time: 1400-1430 OT Individual Time Calculation (min): 30 min    Short Term Goals: Week 3:  OT Short Term Goal 1 (Week 3): STGs = LTGs  Skilled Therapeutic Interventions/Progress Updates:  Pt seen this session with PT as co-treatment for safety. Upon entering the room, pt in supine position and reporting 7/10 pain in L shoulder with pain increasing to 8/10 at end of session. Pt requiring verbal cues and demonstration for self ROM x 10 reps of elbow flexion, elbow extension, wrist flexion, wrist extension, shoulder elevation ~ 75 degrees, and pronation/supination. Pt able to provide active assisted pronation and supination with passive assist for continued stretch in L UE as range limited. Session with focus on patient directed functional transfers. Pt requiring Min A of 2 for sliding board transfer bed >wheelchair and wheelchair > mat. Pt requiring assist for placement of feet and scoots on sliding board. Each transfer pt going towards the R side. Pt able to direct care with Min verbal cues for proper techniques as well as to have pt attempt to set up wheelchair such as lock brakes, pick up feet, move wheelchair, etc. Pt is making progress towards goals.  To await next therapy, pt moved into R sidelying with mod assist for B LEs propped on wedge cushion and positioned with body in proper alignment for comfort.  Therapy Documentation Precautions:  Precautions Precautions: Cervical;Fall Precaution Comments: discussed log roll for cervical precautions Required Braces or Orthoses: Cervical Brace;Other Brace/Splint Cervical Brace: Hard collar;At all times Other Brace/Splint: don calf high ACE Wraps and Abd. binder Restrictions Weight Bearing Restrictions: No Pain: Pain Assessment Pain Assessment: 0-10 Pain Score: 8  Pain Type: Neuropathic  pain Pain Location: Shoulder Pain Orientation: Left Pain Onset: With Activity Pain Intervention(s): Heat applied Multiple Pain Sites: No  See FIM for current functional status  Therapy/Group: Individual Therapy  Lowella Grip 10/19/2013, 4:44 PM

## 2013-10-20 ENCOUNTER — Inpatient Hospital Stay (HOSPITAL_COMMUNITY): Payer: 59 | Admitting: Physical Therapy

## 2013-10-20 ENCOUNTER — Encounter (HOSPITAL_COMMUNITY): Payer: 59 | Admitting: *Deleted

## 2013-10-20 ENCOUNTER — Inpatient Hospital Stay (HOSPITAL_COMMUNITY): Payer: 59

## 2013-10-20 LAB — GLUCOSE, CAPILLARY
Glucose-Capillary: 121 mg/dL — ABNORMAL HIGH (ref 70–99)
Glucose-Capillary: 91 mg/dL (ref 70–99)
Glucose-Capillary: 97 mg/dL (ref 70–99)
Glucose-Capillary: 145 mg/dL — ABNORMAL HIGH (ref 70–99)

## 2013-10-20 NOTE — Progress Notes (Signed)
Occupational Therapy Session Note  Patient Details  Name: [name] MRN: [id number] Date of Birth: Aug 12, 1945  Today's Date: 10/20/2013 OT Individual Time: 1102-1207 OT Individual Time Calculation (min): 65 min    Short Term Goals: Week 3:  OT Short Term Goal 1 (Week 3): STGs = LTGs  Skilled Therapeutic Interventions/Progress Updates: ADL-retraining with focus on stand-pivot transfer from recliner to w/c, transfer from w/c to tub bench in standard tub, improved LUE function during ADL using wash mit, weight-shifting and home safety instruction.   Pt received seated in recliner and receptive for bathing using tub bench in standard tub (ADL apartment).   Pt re-educated on anterior/posterior weight-shifting and right lateral transfers with facilitation to place left LE and provoke weight-shift from right to left hip.   Pt required mod assist during transfers (pt = 50% to stand and 75% to sit safely).    Pt completed bathing seated using wash mit and reached to his ankles during this session while bathing both legs.   Pt was unable to bathe buttocks or head independently during this session but sustained balance and stamina for 30 minutes while bathing.   Pt dressed upper body with mod assist to pull shirt over head and down trunk.   Pt currently requires totals assist for lower body dressing and voided urine accidentally during final transfer from w/c to bed.   RN tech notified for continued need of ADL assist to place condom cath, diaper, and  total assist with lower body dressing.      Therapy Documentation Precautions:  Precautions Precautions: Cervical;Fall Precaution Comments: discussed log roll for cervical precautions Required Braces or Orthoses: Cervical Brace;Other Brace/Splint Cervical Brace: Hard collar;At all times Other Brace/Splint: don calf high ACE Wraps and Abd. binder Restrictions Weight Bearing Restrictions: No  Pain: Pain Assessment Pain Assessment: 0-10 Pain Score:  2  Pain Type: Neuropathic pain Pain Location: Shoulder Pain Orientation: Left Pain Onset: On-going Pain Intervention(s): Repositioned;Cold applied Multiple Pain Sites: No  See FIM for current functional status  Therapy/Group: Individual Therapy  [name] 10/20/2013, 12:49 PM

## 2013-10-20 NOTE — Progress Notes (Signed)
Physical Therapy Session Note  Patient Details  Name: [name] MRN: [id number] Date of Birth: 1945/03/26  Today's Date: 10/20/2013 PT Individual Time: 0900-1000 Tx 2: 9147-8295 PT Individual Time Calculation (min): 60 min  Tx 2: 60 min  Short Term Goals: Week 3:  PT Short Term Goal 1 (Week 3): Pt will transfer bed<>w/c w/ S w/o sliding board consistently PT Short Term Goal 2 (Week 3): Pt will roll R w/ MinA consistently PT Short Term Goal 3 (Week 3): Pt will propel w/c 100' w/ BLE and RUE consistently PT Short Term Goal 4 (Week 3): Pt will tolerate standing w/ therapy for 1 minute without   Skilled Therapeutic Interventions/Progress Updates:    Therapeutic Exercise: PT instructs pt in strengthening and ROM exercises: ankle pumps x 20, L calf stretch x 1 minute, heel slides with mod resistance R LE x 10, A/AROM L heel slide on ascent with min-mod resistance on descent x 10, B piriformis stretch x 1 minute, bridges with PT facilitating L tibial anterior translation x 10, B hamstring stretch x 1 minute, hip IR in B hook lie x 10, SLR with AAROM on L LE and AROM R LE x 10, R hip abduction in side lie x 10, L clam shells in side lie x 10, B quadriceps stretch x 1 minute: all x 2 sets  Therapeutic Activity: PT instructs pt in scooting laterally while lying in supine req stabilization of R foot slipping on sheets min A to R and to L. PT instructs pt in rolling R req min-mod A without rail and in rolling L req SBA and use of momentum without rail. PT instructs pt in supine to sit transfer to R side of bed without rail req min-mod A and in stand-step transfer bed to recliner req min A with gait belt. Call light and all needs in reach.   Tx 2: Gait Training: PT instructs pt in ambulation with RW with L Hand Orthosis req mod A for balance x 10' + 20' and close w/c follow for safety - pt demonstrates a narrow BOS and L knee instability during stance phase.  PT instructs pt in ascending/descending 1  low stair with R handrail req mod A for balance and L LE placement on/off stair.   W/C Management: PT instructs pt in self propelling manual w/c x 150' with B LEs req min A-SBA; pt occasionally needs assist for momentum, but at times is able to go without assist. Pt does demonstrate L LE management independently. PT instructs pt to lock brakes and pt demonstrates ability to lock L brake with L UE, but still needs assist to unlock L brake with L hand or to compensate and use R hand. Pt verbalizes how he will do pressure relief when he is sitting up in w/c and agrees to do it every 30 minutes.   PT initiated stair training, today. Pt did well, only req mod A for balance and L foot placement on/off stair. Pt also demonstrated reaching for L handrail to assist in steadying self once on the step. Pt also demonstrated ability to ambulate with RW and L HO with mod A for balance and walker management, as well as w/c follow for safety due to pt fatiguing quickly with ambulation. Pt will benefit from continued bed mobility training, transfer training, gait training, and w/c management training, as well as B UE/LE strength and ROM training.   Therapy Documentation Precautions:  Precautions Precautions: Cervical;Fall Precaution Comments: discussed log roll for  cervical precautions Required Braces or Orthoses: Cervical Brace;Other Brace/Splint Cervical Brace: Hard collar;At all times Other Brace/Splint: don calf high ACE Wraps and Abd. binder Restrictions Weight Bearing Restrictions: No Pain: Pain Assessment Pain Assessment: 0-10 Pain Score: 6  Pain Type: Neuropathic pain Pain Location: Shoulder Pain Orientation: Left Pain Descriptors / Indicators: Aching Pain Frequency: Intermittent Pain Onset: On-going Patients Stated Pain Goal: 4 Pain Intervention(s): Rest Multiple Pain Sites: No Tx 2: Pt c/o 7/10 pain in L shoulder; PT utilizes rest to decrease pain.  See FIM for current functional  status  Therapy/Group: Individual Therapy Tx 1 Session 2: accompanied by PT Tech  [facility] M 10/20/2013, 9:03 AM

## 2013-10-20 NOTE — Plan of Care (Signed)
Problem: SCI BOWEL ELIMINATION Goal: RH STG MANAGE BOWEL WITH ASSISTANCE STG Manage Bowel with Assistance. Min  Outcome: Not Progressing Requires bowel program
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
## 2013-10-20 NOTE — Progress Notes (Signed)
Physical Therapy Session Note  Patient Details  Name: [name] MRN: [id number] Date of Birth: Nov 22, 1945  Today's Date: 10/20/2013 PT Individual Time: 1402-1502 PT Individual Time Calculation (min): 60 min   Short Term Goals: Week 3:  PT Short Term Goal 1 (Week 3): Pt will transfer bed<>w/c w/ S w/o sliding board consistently PT Short Term Goal 2 (Week 3): Pt will roll R w/ MinA consistently PT Short Term Goal 3 (Week 3): Pt will propel w/c 100' w/ BLE and RUE consistently PT Short Term Goal 4 (Week 3): Pt will tolerate standing w/ therapy for 1 minute without   Skilled Therapeutic Interventions/Progress Updates:    Pt received semi reclined in bed; agreeable to therapy. Session focused on increasing independence with functional transfers. Pt performed supine>sit with HOB elevated requiring min A and use of bed rail. Performed lateral scooting transfer from bed>w/c using slide board with min A, verbal cueing for repositioning of LLE. W/c mobility x55' in controlled environment with bilat LE's requiring min A, increased time, tactile cueing at L distal quadriceps to facilitation initiation of L knee extension, tactile cueing at L knee for increased weightbearing. Transported pt remaining distance to treatment gym secondary to pt fatigue. In gym, performed stand pivot transfer from w/c>mat table with rolling walker and mod A, manual facilitation of lateral weight shift to L side. Transitioned to blocked practice of sit<>stand transfers with min-mod A, min multimodal cueing for setup, anterior weight shift with effective within-session carryover. Session ended in treatment gym, where pt was left seated EOM accompanied by PT for next therapy session.  Therapy Documentation Precautions:  Precautions Precautions: Cervical;Fall Precaution Comments: discussed log roll for cervical precautions Required Braces or Orthoses: Cervical Brace;Other Brace/Splint Cervical Brace: Hard collar;At all  times Other Brace/Splint: don calf high ACE Wraps and Abd. binder Restrictions Weight Bearing Restrictions: No Pain: Pain Assessment Pain Assessment: 0-10 Pain Score: 7  Pain Type: Acute pain Pain Location: Shoulder Pain Orientation: Left Pain Descriptors / Indicators: Aching Pain Frequency: Intermittent Pain Onset: On-going Patients Stated Pain Goal: 3 Pain Intervention(s): Medication (See eMAR) Multiple Pain Sites: Yes 2nd Pain Site Pain Score: 7 Pain Type: Neuropathic pain Pain Location: Shoulder Pain Orientation: Left Pain Descriptors / Indicators: Aching Pain Intervention(s): RN made aware Locomotion : Ambulation Ambulation/Gait Assistance: 3: Mod assist Wheelchair Mobility Distance: 150   See FIM for current functional status  Therapy/Group: Individual Therapy  Calvert Cantor 10/20/2013, 4:23 PM

## 2013-10-21 ENCOUNTER — Inpatient Hospital Stay (HOSPITAL_COMMUNITY): Payer: 59 | Admitting: Occupational Therapy

## 2013-10-21 ENCOUNTER — Inpatient Hospital Stay (HOSPITAL_COMMUNITY): Payer: 59 | Admitting: Physical Therapy

## 2013-10-21 DIAGNOSIS — IMO0002 Reserved for concepts with insufficient information to code with codable children: Secondary | ICD-10-CM

## 2013-10-21 LAB — GLUCOSE, CAPILLARY
Glucose-Capillary: 100 mg/dL — ABNORMAL HIGH (ref 70–99)
Glucose-Capillary: 159 mg/dL — ABNORMAL HIGH (ref 70–99)
Glucose-Capillary: 119 mg/dL — ABNORMAL HIGH (ref 70–99)
Glucose-Capillary: 82 mg/dL (ref 70–99)
Glucose-Capillary: 148 mg/dL — ABNORMAL HIGH (ref 70–99)

## 2013-10-21 NOTE — Progress Notes (Signed)
Physical Therapy Session Note  Patient Details  Name: [name] MRN: [id number] Date of Birth: 09-12-1945  Today's Date: 10/21/2013 PT Individual Time: 1100-1200 PT Individual Time Calculation (min): 60 min   Short Term Goals: Week 3:  PT Short Term Goal 1 (Week 3): Pt will transfer bed<>w/c w/ S w/o sliding board consistently PT Short Term Goal 2 (Week 3): Pt will roll R w/ MinA consistently PT Short Term Goal 3 (Week 3): Pt will propel w/c 100' w/ BLE and RUE consistently PT Short Term Goal 4 (Week 3): Pt will tolerate standing w/ therapy for 1 minute without   Skilled Therapeutic Interventions/Progress Updates:    Therapeutic Activity: PT instructs pt in supine to sit to L req min A and pt utilizing momentum of the trunk and R UE holding L hand and utilizing L elbow into the bed, while pt got both legs off of the bed by himself. PT instructs pt in sit to stand with RW and L HO req min-mod A. PT instructs pt in w/c to recliner chair transfer with RW and L HO req min-mod A for balance; PT had placed 2 pillows in seat of chair for increased pressure relief and increased ease of standing pt to get him out of the recliner.  Gait Training: PT instructs pt in ambulation with RW and L HO req min-mod A for walker stability and balance x 30' + 45' + 60' with w/c follow for when pt fatigued. Pt demonstrates gluteal lurch in L stance phase and B LEs tend to adduct to semi-tandem position without verbal cues to concentrate on keeping feet wide apart.   W/C Management: PT instructs pt in w/c propulsion with R UE/LE x 150' req SBA, including navigating in tight spaces to position self for transfer to recliner. PT instructs pt in how to lock/unlock L brake with L hand and pt demonstrates ability to do so. PT also initiated pt pushing L legrest release lever with L hand and pt demonstrates ability to do this with increased time.   Pt is continuing to progress with functional mobility; more focus should  be done on bed mobility training, ambulation training, and B LE strengthening/NMR, as well as incorporating B UE NMR, as appropriate.   Therapy Documentation Precautions:  Precautions Precautions: Cervical;Fall Precaution Comments: discussed log roll for cervical precautions Required Braces or Orthoses: Cervical Brace;Other Brace/Splint Cervical Brace: Hard collar;At all times Other Brace/Splint: don calf high ACE Wraps and Abd. binder Restrictions Weight Bearing Restrictions: No Pain: Pain Assessment Pain Assessment: 0-10 Pain Score: 5  Pain Type: Chronic pain;Neuropathic pain Pain Location: Shoulder Pain Orientation: Left Pain Onset: On-going Pain Intervention(s): Rest Multiple Pain Sites: No  See FIM for current functional status  Therapy/Group: Individual Therapy  Jani Moronta M 10/21/2013, 11:09 AM

## 2013-10-21 NOTE — Progress Notes (Signed)
Occupational Therapy Session Note  Patient Details  Name: [name] MRN: [id number] Date of Birth: [birth year]  Today's Date: 10/21/2013 OT Individual Time: 1425-1510 OT Individual Time Calculation (min): 45 min    Skilled Therapeutic Interventions/Progress Updates:    Pt worked on sit to stand and standing balance to begin session in order to perform peri care and change brief.  He was able to use the washcloth in his left hand for washing his front private area while stabilizing the left hand on the walker.  He needed mod assist to perform clothing management and pull his pants over his hips after therapist and tech donned incontinence brief.  [name] transitioned down to the therapy gym and transferred to the therapy mat with mod assist stand pivot.  Worked on sit to stand transitions and squat to stand transitions from adjustable mat.  He was able to perform initial sit to stand with mod assist without use of his UEs, but demonstrates decreased timing of hip and knee extension.  Max assist needed after performing the first interval if pt was instructed to sit down slowly and then stand back up when his bottom touched the blue mat.  In-between standing intervals worked on LUE strengthening by having pt perform shoulder flexion bilaterally while holding dowel rod.  Mod facilitation for shoulder flexion to 80 degrees to place as well as pick up cane from rehab techs outstretched hands.  Progressed to having him work on reaching down to the rolling stool and picking up his water with the left hand and then bringing up to his mouth to drink.  Mod assist needed to hold the cup with the LUE.  Finished session by transferring from wheelchair to recliner with total +2 (pt 40%) bilateral hand held assist.  Pt with decreased ability to maintain stance phase on the LLE when advancing the right as well as not being able to efficiently advance the LLE.    Therapy Documentation Precautions:   Precautions Precautions: Cervical;Fall Precaution Comments: discussed log roll for cervical precautions Required Braces or Orthoses: Cervical Brace;Other Brace/Splint Cervical Brace: Hard collar;At all times Other Brace/Splint: don calf high ACE Wraps and Abd. binder Restrictions Weight Bearing Restrictions: No  Vital Signs: Therapy Vitals Temp: 98.1 F (36.7 C) Temp src: Oral Pulse Rate: 100 Resp: 18 BP: 113/69 mmHg Patient Position (if appropriate): Sitting Oxygen Therapy SpO2: 95 % O2 Device: None (Room air) Pain: Pain Assessment Pain Assessment: Faces Faces Pain Scale: Hurts little more Pain Type: Acute pain Pain Location: Shoulder Pain Orientation: Left Pain Intervention(s): Repositioned;Emotional support;Ambulation/increased activity ADL: See FIM for current functional status  Therapy/Group: Individual Therapy  Shaelee Forni OTR/L 10/21/2013, 3:52 PM
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
## 2013-10-22 ENCOUNTER — Inpatient Hospital Stay (HOSPITAL_COMMUNITY): Payer: 59

## 2013-10-22 DIAGNOSIS — E119 Type 2 diabetes mellitus without complications: Secondary | ICD-10-CM

## 2013-10-22 LAB — GLUCOSE, CAPILLARY
Glucose-Capillary: 117 mg/dL — ABNORMAL HIGH (ref 70–99)
Glucose-Capillary: 106 mg/dL — ABNORMAL HIGH (ref 70–99)
Glucose-Capillary: 147 mg/dL — ABNORMAL HIGH (ref 70–99)
Glucose-Capillary: 176 mg/dL — ABNORMAL HIGH (ref 70–99)
Glucose-Capillary: 108 mg/dL — ABNORMAL HIGH (ref 70–99)
Glucose-Capillary: 79 mg/dL (ref 70–99)

## 2013-10-22 NOTE — Progress Notes (Signed)
Physical Therapy Session Note  Patient Details  Name: [name] MRN: [id number] Date of Birth: June 05, 1945  Today's Date: 10/22/2013 PT Individual Time: 1300-1345 PT Individual Time Calculation (min): 45 min   Short Term Goals: Week 3:  PT Short Term Goal 1 (Week 3): Pt will transfer bed<>w/c w/ S w/o sliding board consistently PT Short Term Goal 2 (Week 3): Pt will roll R w/ MinA consistently PT Short Term Goal 3 (Week 3): Pt will propel w/c 100' w/ BLE and RUE consistently PT Short Term Goal 4 (Week 3): Pt will tolerate standing w/ therapy for 1 minute without   Skilled Therapeutic Interventions/Progress Updates:    Pt received supine in bed, agreeable to participate in therapy. Pt moved supine>sit EOB w/ S, then performed stand pivot transfer w/ RW and ModA bed>w/c. Pt propelled w/c w/ BLE and RUE w/ S, required several rest breaks due to decreased activity tolerance. Pt ambulated 76' then 40' w/ RW and ModA, assist for weight shifting onto LLE and max vc's for wider steps and upright positioning. TotalA to propel pt back to room, pt performed SPT w/c>recliner w/ RW and MinA. Pt left seated in recliner w/ all needs within reach.  Therapy Documentation Precautions:  Precautions Precautions: Cervical;Fall Precaution Comments: discussed log roll for cervical precautions Required Braces or Orthoses: Cervical Brace;Other Brace/Splint Cervical Brace: Hard collar;At all times Other Brace/Splint: don calf high ACE Wraps and Abd. binder Restrictions Weight Bearing Restrictions: No General:   Vital Signs: Therapy Vitals Temp: 97.6 F (36.4 C) Temp src: Oral Pulse Rate: 94 Resp: 18 BP: 131/58 mmHg Patient Position (if appropriate): Sitting Oxygen Therapy SpO2: 99 % O2 Device: None (Room air) Pain:   Mobility:   Locomotion :    Trunk/Postural Assessment :    Balance:   Exercises:   Other Treatments:    See FIM for current functional status  Therapy/Group: Individual  Therapy  Hosie Spangle Hosie Spangle, PT, DPT 10/22/2013, 5:02 PM
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
## 2013-10-23 ENCOUNTER — Inpatient Hospital Stay (HOSPITAL_COMMUNITY): Payer: 59 | Admitting: Physical Therapy

## 2013-10-23 ENCOUNTER — Encounter (HOSPITAL_COMMUNITY): Payer: 59

## 2013-10-23 ENCOUNTER — Inpatient Hospital Stay (HOSPITAL_COMMUNITY): Payer: 59

## 2013-10-23 ENCOUNTER — Encounter (HOSPITAL_COMMUNITY): Payer: 59 | Admitting: *Deleted

## 2013-10-23 DIAGNOSIS — S069X9A Unspecified intracranial injury with loss of consciousness of unspecified duration, initial encounter: Secondary | ICD-10-CM

## 2013-10-23 DIAGNOSIS — D62 Acute posthemorrhagic anemia: Secondary | ICD-10-CM

## 2013-10-23 DIAGNOSIS — I10 Essential (primary) hypertension: Secondary | ICD-10-CM

## 2013-10-23 DIAGNOSIS — IMO0002 Reserved for concepts with insufficient information to code with codable children: Secondary | ICD-10-CM

## 2013-10-23 DIAGNOSIS — S069XAA Unspecified intracranial injury with loss of consciousness status unknown, initial encounter: Secondary | ICD-10-CM

## 2013-10-23 LAB — GLUCOSE, CAPILLARY
Glucose-Capillary: 121 mg/dL — ABNORMAL HIGH (ref 70–99)
Glucose-Capillary: 184 mg/dL — ABNORMAL HIGH (ref 70–99)
Glucose-Capillary: 89 mg/dL (ref 70–99)
Glucose-Capillary: 119 mg/dL — ABNORMAL HIGH (ref 70–99)

## 2013-10-23 MED ORDER — HYDROCODONE-ACETAMINOPHEN 5-325 MG PO TABS
1.0000 | ORAL_TABLET | Freq: Four times a day (QID) | ORAL | Status: DC | PRN
  Administered 2013-10-23 – 2013-10-27 (×9): 1 via ORAL
  Filled 2013-10-23 (×11): qty 1

## 2013-10-23 MED ORDER — BETHANECHOL CHLORIDE 25 MG PO TABS
25.0000 mg | ORAL_TABLET | Freq: Three times a day (TID) | ORAL | Status: DC
  Administered 2013-10-23 – 2013-10-24 (×4): 25 mg via ORAL
  Filled 2013-10-23 (×6): qty 1

## 2013-10-23 NOTE — Progress Notes (Signed)
Physical Therapy Session Note  Patient Details  Name: [name] MRN: [id number] Date of Birth: 11-Jun-1945  Today's Date: 10/23/2013 PT Individual Time: 1400-1500 PT Individual Time Calculation (min): 60 min   Short Term Goals: Week 3:  PT Short Term Goal 1 (Week 3): Pt will transfer bed<>w/c w/ S w/o sliding board consistently PT Short Term Goal 2 (Week 3): Pt will roll R w/ MinA consistently PT Short Term Goal 3 (Week 3): Pt will propel w/c 100' w/ BLE and RUE consistently PT Short Term Goal 4 (Week 3): Pt will tolerate standing w/ therapy for 1 minute without   Skilled Therapeutic Interventions/Progress Updates:    Pt received seated in w/c, agreeable to participate in therapy. Pt propelled w/c 180' to ortho gym w/ MinA initially w/ BLE and RUE then w/ RUE/RLE. Introduced Primary school teacher, pt performed transfer w/c <> practice car w/ sliding board and MinA for foot placement, MinA to manage LLE in/out of car. Set up pt w/ LiteGait harness. Pt w/ x1 sit<>stand from w/c w/ RW w/ ModA in order to don harness (note it took two attempts for pt to stand). Pt ambulated 17' on Litegait at 0.1 speed. Noted pt had difficulty maintaining upright positioning leading to hanging in harness and difficulty advancing limbs. Pt requested to stop Litegait due to discomfort in groin area from straps. Suspect pt would have improved results if gait training w/ more support (i.e. [employer]). During gait training therapist provided assist for advancing L limb and shifting weight onto L limb during stance phase. Pt left seated in w/c in rehab gym to await second PT session.  Therapy Documentation Precautions:  Precautions Precautions: Cervical;Fall Precaution Comments: discussed log roll for cervical precautions Required Braces or Orthoses: Cervical Brace;Other Brace/Splint Cervical Brace: Hard collar;At all times Other Brace/Splint: don calf high ACE Wraps and Abd. binder Restrictions Weight Bearing  Restrictions: No Pain: Pain Assessment Pain Assessment: No/denies pain  See FIM for current functional status  Therapy/Group: Individual Therapy  Hosie Spangle Hosie Spangle, PT, DPT 10/23/2013, 1:48 PM

## 2013-10-23 NOTE — Progress Notes (Signed)
Physical Therapy Session Note  Patient Details  Name: [name] MRN: [id number] Date of Birth: [birth year]  Today's Date: 10/23/2013 PT Individual Time: 0865-7846 PT Individual Time Calculation (min): 62 min   Short Term Goals: Week 3:  PT Short Term Goal 1 (Week 3): Pt will transfer bed<>w/c w/ S w/o sliding board consistently PT Short Term Goal 2 (Week 3): Pt will roll R w/ MinA consistently PT Short Term Goal 3 (Week 3): Pt will propel w/c 100' w/ BLE and RUE consistently PT Short Term Goal 4 (Week 3): Pt will tolerate standing w/ therapy for 1 minute without   Skilled Therapeutic Interventions/Progress Updates:    Pt received seated in w/c in treatment gym having just completed previous PT session. Pt wearing hard cervical collar and LiteGait harness, as prior session focused on gait training using LiteGait. Pt performed multiple sit<>stand transfers from w/c with min-mod A to adjust LiteGait harness. Performed static standing with 1x30 seconds, 1x15 seconds with bilat UE support and min guard. Once standing on treadmill supported by LiteGait harness, pt with decreased quadriceps control, as exhibited by frequent bilat knee buckling. Verbal/tactile cueing focused on active standing in harness as opposed to passively hanging in partial standing. Performed multiple brief trials of static/dynamicstanding and short-distance (<20') of gait using LiteGait system prior to therapist/pt agreeing to end activity secondary to significant pt fatigue. After seated rest break, transitioned to NuStep to increase muscular endurance in bilat LE's. Performed stand pivot transfer from w/c<>Nustep with rolling walker, L hand orthosis, and mod A for sit>stand, manual facilitation of lateral weight shift to L side during pivoting. Pt performed NuStep with bilat LE's only x8.5 minutes at resistance level 2. Due to pt-reported episode of urinary incontinence, transported pt back to room in w/c with Total A. Performed  stand pivot transfer from w/c>bed with rolling walker, L hand orthosis, mod A. Sit>supine with mod A for bilat LE management. Performed scooting to [facility] with bed in Trendelenburg position using bilat LE's only with tactile cueing at bilat knees for weightbearing. RN present, notified of urinary incontinence. RN to assist pt with changing at this time. Departed with pt semi reclined in bed with RN present and all needs within reach.  Therapy Documentation Precautions:  Precautions Precautions: Cervical;Fall Precaution Comments: discussed log roll for cervical precautions Required Braces or Orthoses: Cervical Brace;Other Brace/Splint Cervical Brace: Hard collar;At all times Other Brace/Splint: don calf high ACE Wraps and Abd. binder Restrictions Weight Bearing Restrictions: No Vital Signs: Therapy Vitals Temp: 98.9 F (37.2 C) Temp src: Oral Pulse Rate: 105 Resp: 18 BP: 143/56 mmHg Patient Position (if appropriate): Sitting Oxygen Therapy SpO2: 100 % O2 Device: None (Room air) Pain: Pain Assessment Pain Assessment: No/denies pain  See FIM for current functional status  Therapy/Group: Individual Therapy  Hobble, Lorenda Ishihara 10/23/2013, 3:39 PM

## 2013-10-23 NOTE — Progress Notes (Signed)
Occupational Therapy Session Note  Patient Details  Name: Itay Mella MRN: [id number] Date of Birth: 1945-07-25  Today's Date: 10/23/2013 OT Individual Time: 1100-1200 OT Individual Time Calculation (min): 60 min    Short Term Goals: Week 3:  OT Short Term Goal 1 (Week 3): STGs = LTGs  Skilled Therapeutic Interventions/Progress Updates:    Pt resting in bed upon arrival.  Pt engaged in bed mobility, sit<>stand, standing balance, sliding board transfers, dynamic sitting balance, activity tolerance, and safety awareness.  Pt required mod verbal cues for set up of sliding board for transfer to w/c.  Pt performed sliding board transfer with steady A and assist to position legs.  Pt performed rolling to right and left in bed and supine->sit EOB with supervision.  Pt used Stedy to pull up on for standing to facilitate pulling up pants.    Therapy Documentation Precautions:  Precautions Precautions: Cervical;Fall Precaution Comments: discussed log roll for cervical precautions Required Braces or Orthoses: Cervical Brace;Other Brace/Splint Cervical Brace: Hard collar;At all times Other Brace/Splint: don calf high ACE Wraps and Abd. binder Restrictions Weight Bearing Restrictions: No   Pain: Pain Assessment Pain Assessment: No/denies pain Pain Score: 0-No pain  See FIM for current functional status  Therapy/Group: Individual Therapy  Rich Brave 10/23/2013, 12:07 PM

## 2013-10-23 NOTE — Progress Notes (Signed)
Physical Therapy Session Note  Patient Details  Name: [name] MRN: [id number] Date of Birth: 04/09/1945  Today's Date: 10/23/2013 PT Individual Time: 0900-1000 PT Individual Time Calculation (min): 60 min   Short Term Goals: Week 3:  PT Short Term Goal 1 (Week 3): Pt will transfer bed<>w/c w/ S w/o sliding board consistently PT Short Term Goal 2 (Week 3): Pt will roll R w/ MinA consistently PT Short Term Goal 3 (Week 3): Pt will propel w/c 100' w/ BLE and RUE consistently PT Short Term Goal 4 (Week 3): Pt will tolerate standing w/ therapy for 1 minute without   Skilled Therapeutic Interventions/Progress Updates:  1:1. Pt received supine in bed, ready for therapy. Focus this session on bed mobility and functional transfers. Pt reported immediate onset of urinary urgency with subsequent incontinence. Pt req close(S)-min A for B rolling multiple times for clean up req total A. Min A for t/f sup>sit EOB w/ use of bed rails. Therapist assisting w/ upper and lower body dressing for time management. Mod A for SPT bed>w/c. Pt req close(S)-min A for completion of SBT standard bed in therapy apartment<>w/c. Mod A for t/f sup>sit w/ use of RW for UE support. Pt verbalizing concerns regarding tolerance to lying flat in standard bed w/ neck brace in place, discussion regarding building head up with pillows or use of wedge for increased tolerance. Pt challenged to lie more flat tonight w/ pillow support as trial to simulate standard bed at home.  Pt req mod A for t/f sit>R sidelying and min A for t/f R sidelying>sit w/ mod cues for seq. Pt transferred back to bed at end of session w/ mod A due to pt experiencing another episode of incontinence. Pt lying flat w/ support from pillows to trial tolerance to flat bed. Pt left in care of nurse tech at end of session for clean up. Pt appeared to req increased cues for seq and technique this session compared to previous sessions, pt's primary PT aware.   Therapy  Documentation Precautions:  Precautions Precautions: Cervical;Fall Precaution Comments: discussed log roll for cervical precautions Required Braces or Orthoses: Cervical Brace;Other Brace/Splint Cervical Brace: Hard collar;At all times Other Brace/Splint: don calf high ACE Wraps and Abd. binder Restrictions Weight Bearing Restrictions: No Pain: Pain Assessment Pain Assessment: No/denies pain Pain Score: 0-No pain   See FIM for current functional status  Therapy/Group: Individual Therapy  Denzil Hughes 10/23/2013, 12:07 PM
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
## 2013-10-24 ENCOUNTER — Inpatient Hospital Stay (HOSPITAL_COMMUNITY): Payer: 59 | Admitting: Occupational Therapy

## 2013-10-24 ENCOUNTER — Encounter (HOSPITAL_COMMUNITY): Payer: 59 | Admitting: *Deleted

## 2013-10-24 ENCOUNTER — Inpatient Hospital Stay (HOSPITAL_COMMUNITY): Payer: 59

## 2013-10-24 DIAGNOSIS — S069X9A Unspecified intracranial injury with loss of consciousness of unspecified duration, initial encounter: Secondary | ICD-10-CM

## 2013-10-24 DIAGNOSIS — S069XAA Unspecified intracranial injury with loss of consciousness status unknown, initial encounter: Secondary | ICD-10-CM

## 2013-10-24 DIAGNOSIS — IMO0002 Reserved for concepts with insufficient information to code with codable children: Secondary | ICD-10-CM

## 2013-10-24 DIAGNOSIS — I10 Essential (primary) hypertension: Secondary | ICD-10-CM

## 2013-10-24 DIAGNOSIS — D62 Acute posthemorrhagic anemia: Secondary | ICD-10-CM

## 2013-10-24 LAB — CBC
HCT: 34.7 % — ABNORMAL LOW (ref 39.0–52.0)
Hemoglobin: 11.3 g/dL — ABNORMAL LOW (ref 13.0–17.0)
MCH: 27.2 pg (ref 26.0–34.0)
MCHC: 32.6 g/dL (ref 30.0–36.0)
MCV: 83.4 fL (ref 78.0–100.0)
Platelets: 187 10*3/uL (ref 150–400)
RBC: 4.16 MIL/uL — ABNORMAL LOW (ref 4.22–5.81)
RDW: 12.3 % (ref 11.5–15.5)
WBC: 4.1 10*3/uL (ref 4.0–10.5)

## 2013-10-24 LAB — BASIC METABOLIC PANEL
Anion gap: 10 (ref 5–15)
BUN: 13 mg/dL (ref 6–23)
CO2: 25 mEq/L (ref 19–32)
Calcium: 8.8 mg/dL (ref 8.4–10.5)
Chloride: 108 mEq/L (ref 96–112)
Creatinine, Ser: 1 mg/dL (ref 0.50–1.35)
GFR calc Af Amer: 89 mL/min — ABNORMAL LOW (ref >90)
GFR calc non Af Amer: 76 mL/min — ABNORMAL LOW (ref >90)
Glucose, Bld: 107 mg/dL — ABNORMAL HIGH (ref 70–99)
Potassium: 3.9 mEq/L (ref 3.7–5.3)
Sodium: 143 mEq/L (ref 137–147)

## 2013-10-24 LAB — GLUCOSE, CAPILLARY
Glucose-Capillary: 101 mg/dL — ABNORMAL HIGH (ref 70–99)
Glucose-Capillary: 126 mg/dL — ABNORMAL HIGH (ref 70–99)
Glucose-Capillary: 98 mg/dL (ref 70–99)
Glucose-Capillary: 137 mg/dL — ABNORMAL HIGH (ref 70–99)

## 2013-10-24 MED ORDER — BETHANECHOL CHLORIDE 10 MG PO TABS
10.0000 mg | ORAL_TABLET | Freq: Three times a day (TID) | ORAL | Status: DC
  Administered 2013-10-24 – 2013-10-25 (×2): 10 mg via ORAL
  Filled 2013-10-24 (×6): qty 1

## 2013-10-24 NOTE — Progress Notes (Signed)
Physical Therapy Session Note  Patient Details  Name: [name] MRN: [id number] Date of Birth: January 28, 1946  Today's Date: 10/24/2013 PT Individual Time: 1505-1555 PT Individual Time Calculation (min): 50 min  and  Today's Date: 10/24/2013 PT Missed Time: 10 Minutes Missed Time Reason: Patient fatigue  Short Term Goals: Week 3:  PT Short Term Goal 1 (Week 3): Pt will transfer bed<>w/c w/ S w/o sliding board consistently PT Short Term Goal 2 (Week 3): Pt will roll R w/ MinA consistently PT Short Term Goal 3 (Week 3): Pt will propel w/c 100' w/ BLE and RUE consistently PT Short Term Goal 4 (Week 3): Pt will tolerate standing w/ therapy for 1 minute without   Skilled Therapeutic Interventions/Progress Updates:    Pt received handed off from OT in ADL apartment w/ wife present for family training. Pt propelled w/c 75' to ortho gym for car transfer training w/ wife. Pt performed stand pivot transfer w/ RW w/c<>car w/ Min GuardA then MinA to manage LLE in/out of car. Pts wife demonstrated ability to assist pt w/ lifting L leg in and out of car. Pt reporting incontinence in brief, asked to return to room in order to change. Pt propelled w/c 200' back to room w/ RUE/RLE. Pt w/ sit<>stand w/ MinA w/ RW in order for NT to change brief and clean up perineal area. Pt w/ scoot transfer w/ MinA to R w/c>bed, then scoot transfer to L w/ SBA. Pt instructed wife on setting up sliding board transfer. Pt transferred SBT w/c>bed to R and bed>w/c to L. Long discussion on use of hospital bed in home and setting up for transfers. Pt reporting fatigue, asking to end session early. Pt w/ SPT w/ Min Guard to recliner, left seated in recliner w/ wife present w/ all needs within reach.  Therapy Documentation Precautions:  Precautions Precautions: Cervical;Fall Precaution Comments: discussed log roll for cervical precautions Required Braces or Orthoses: Cervical Brace;Other Brace/Splint Cervical Brace: Hard collar;At  all times Other Brace/Splint: don calf high ACE Wraps and Abd. binder Restrictions Weight Bearing Restrictions: No Vital Signs: Therapy Vitals Temp: 98 F (36.7 C) Temp src: Oral Pulse Rate: 96 Resp: 18 BP: 151/77 mmHg Patient Position (if appropriate): Sitting Oxygen Therapy SpO2: 100 % O2 Device: None (Room air) Pain: Pain Assessment Pain Assessment: 0-10 Pain Score: 2  Pain Type: Acute pain Pain Location: Shoulder Pain Orientation: Left Pain Descriptors / Indicators: Aching;Constant Pain Frequency: Constant Pain Onset: On-going Patients Stated Pain Goal: 3 Pain Intervention(s): Medication (See eMAR);Repositioned;Emotional support Multiple Pain Sites: No  See FIM for current functional status  Therapy/Group: Individual Therapy  Hosie Spangle Hosie Spangle, PT, DPT 10/24/2013, 5:11 PM

## 2013-10-24 NOTE — Progress Notes (Signed)
Physical Therapy Session Note  Patient Details  Name: [name] MRN: [id number] Date of Birth: [birth year]  Today's Date: 10/24/2013 PT Individual Time:  -      Short Term Goals: Week 3:  PT Short Term Goal 1 (Week 3): Pt will transfer bed<>w/c w/ S w/o sliding board consistently PT Short Term Goal 2 (Week 3): Pt will roll R w/ MinA consistently PT Short Term Goal 3 (Week 3): Pt will propel w/c 100' w/ BLE and RUE consistently PT Short Term Goal 4 (Week 3): Pt will tolerate standing w/ therapy for 1 minute without   Skilled Therapeutic Interventions/Progress Updates:  1:1. Pt received semi-reclined in bed, verbalized feeling very overwhelmed this AM regarding medication changes, needing to void prior to starting session as well as feeling rushed this AM. Emotional support provided, pt requesting increased time to attempt to void before moving to prevent incontinence. Upon re-entry, pt more relaxed and ready for therapy. Focus this session on functional transfers, w/c propulsion and car transfer. Pt req minA for rolling R and supervision for rolling L for brief change and therapist assisting w/ dressing for time management. Supervision for t/f sup>sit EOB w/ use of hospital bed functions and supervision-min guard A for SBT bed>w/c. Supervision for w/c propulsion R UE/LE 150'x2 w/ slow, but even pace. Pt req min A for completion of car transfer w/ slide board, mod cueing for seq and technique. Pt left sitting in w/c at end of session w/ all needs in reach. Pt missed at start of session due to toileting.     Therapy Documentation Precautions:  Precautions Precautions: Cervical;Fall Precaution Comments: discussed log roll for cervical precautions Required Braces or Orthoses: Cervical Brace;Other Brace/Splint Cervical Brace: Hard collar;At all times Other Brace/Splint: don calf high ACE Wraps and Abd. binder Restrictions Weight Bearing Restrictions: No Pain: Pain Assessment Pain  Assessment: 0-10 Pain Score: 6  Pain Type: Acute pain Pain Location: Shoulder Pain Orientation: Left Pain Descriptors / Indicators: Aching Pain Frequency: Constant Pain Onset: On-going Patients Stated Pain Goal: 2 Pain Intervention(s): Medication (See eMAR)  See FIM for current functional status  Therapy/Group: Individual Therapy  Denzil Hughes 10/24/2013, 9:19 AM

## 2013-10-24 NOTE — Progress Notes (Signed)
Physical Therapy Session Note  Patient Details  Name: [name] MRN: [id number] Date of Birth: January 21, 1946  Today's Date: 10/24/2013 PT Individual Time: 1045-1100 PT Individual Time Calculation (min): 15 min   Short Term Goals: Week 3:  PT Short Term Goal 1 (Week 3): Pt will transfer bed<>w/c w/ S w/o sliding board consistently PT Short Term Goal 2 (Week 3): Pt will roll R w/ MinA consistently PT Short Term Goal 3 (Week 3): Pt will propel w/c 100' w/ BLE and RUE consistently PT Short Term Goal 4 (Week 3): Pt will tolerate standing w/ therapy for 1 minute without   Skilled Therapeutic Interventions/Progress Updates:    Make up session for time missed this AM. Pt performed 2x sit<>stand, first time for NTs to change brief after incontinence, second time with therapist for 52min45 sec for upright tolerance. Propelled w/c 10' to other side of bed, MinA to setup for SBT, S for transfer. Pt left seated EOB handoff to OT.   Therapy Documentation Precautions:  Precautions Precautions: Cervical;Fall Precaution Comments: discussed log roll for cervical precautions Required Braces or Orthoses: Cervical Brace;Other Brace/Splint Cervical Brace: Hard collar;At all times Other Brace/Splint: don calf high ACE Wraps and Abd. binder Restrictions Weight Bearing Restrictions: No General:   Vital Signs: Therapy Vitals BP: 146/62 mmHg Patient Position (if appropriate): Sitting Pain: Pain Assessment Pain Assessment: 0-10 Pain Score: 2  Pain Type: Acute pain Pain Location: Shoulder Pain Orientation: Left Pain Descriptors / Indicators: Aching Pain Frequency: Constant Pain Onset: On-going Patients Stated Pain Goal: 3 Pain Intervention(s): Medication (See eMAR);Repositioned;Emotional support (Scheduled pain medication) Multiple Pain Sites: No Mobility:   Locomotion :    Trunk/Postural Assessment :    Balance:   Exercises:   Other Treatments:    See FIM for current functional  status  Therapy/Group: Individual Therapy  Hosie Spangle Hosie Spangle, PT, DPT 10/24/2013, 12:02 PM

## 2013-10-24 NOTE — Progress Notes (Signed)
Occupational Therapy Session Note  Patient Details  Name: [name] MRN: [id number] Date of Birth: [birth year]  Today's Date: 10/24/2013 OT Individual Time: 1100-1205 and 200-305 OT Individual Time Calculation (min): 65 min and 65 min    Short Term Goals: Week 3:  OT Short Term Goal 1 (Week 3): STGs = LTGs  Skilled Therapeutic Interventions/Progress Updates:  1)  Patient in the middle of slide board transfer with PT upon arrival.  Engaged in self care retraining to include sponge bath (declined shower) and dress, BUE strength and ROM exercises, bed>w/c transfer.  Focused session on decreasing edema in left hand, increased function of BUEs during BADL tasks, myofascial work to decrease muscle soreness and tightness, safe bed>w/c slideboard transfer.  2)  Patient seated in w/c upon arrival with his wife at his side.  Engaged in tub bench slide board transfers and 3in1 BSC stand step with RW transfer to include clothing management.  Focused session on caregiver education and wife performing hands on.  Wife required mod cues for w/c><tub bench via slide board and at one point the board moved and patient required +2 for safety and wife required min-mod cues.  Visitor arrived for patient to sign important papers and he stated that he was pleased that he could hold a pen and initial several places and sign his name.  Patient performed 2 w/c><BSC stand-step transfers with RW and min/steady assist or close supervision and vcs.  Patient able to perform pants down.  Patient and wife pleased with his achievenments and are hoping that he will be able to perform tub bench transfers without the sliding board.     Therapy Documentation Precautions:  Precautions Precautions: Cervical;Fall Precaution Comments: discussed log roll for cervical precautions Required Braces or Orthoses: Cervical Brace;Other Brace/Splint Cervical Brace: Hard collar;At all times Other Brace/Splint: don calf high ACE Wraps and Abd.  binder Restrictions Weight Bearing Restrictions: No Pain: 1)   2)   ADL: See FIM for current functional status  Therapy/Group: Individual Therapy both sessions  Lore Polka 10/24/2013, 8:25 AM
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
## 2013-10-25 ENCOUNTER — Inpatient Hospital Stay (HOSPITAL_COMMUNITY): Payer: 59

## 2013-10-25 ENCOUNTER — Inpatient Hospital Stay (HOSPITAL_COMMUNITY): Payer: 59 | Admitting: Physical Therapy

## 2013-10-25 ENCOUNTER — Encounter (HOSPITAL_COMMUNITY): Payer: 59 | Admitting: *Deleted

## 2013-10-25 ENCOUNTER — Inpatient Hospital Stay (HOSPITAL_COMMUNITY): Payer: 59 | Admitting: Occupational Therapy

## 2013-10-25 DIAGNOSIS — S069X9A Unspecified intracranial injury with loss of consciousness of unspecified duration, initial encounter: Secondary | ICD-10-CM

## 2013-10-25 DIAGNOSIS — I10 Essential (primary) hypertension: Secondary | ICD-10-CM

## 2013-10-25 DIAGNOSIS — D62 Acute posthemorrhagic anemia: Secondary | ICD-10-CM

## 2013-10-25 DIAGNOSIS — S069XAA Unspecified intracranial injury with loss of consciousness status unknown, initial encounter: Secondary | ICD-10-CM

## 2013-10-25 DIAGNOSIS — IMO0002 Reserved for concepts with insufficient information to code with codable children: Secondary | ICD-10-CM

## 2013-10-25 LAB — GLUCOSE, CAPILLARY
Glucose-Capillary: 128 mg/dL — ABNORMAL HIGH (ref 70–99)
Glucose-Capillary: 112 mg/dL — ABNORMAL HIGH (ref 70–99)
Glucose-Capillary: 112 mg/dL — ABNORMAL HIGH (ref 70–99)
Glucose-Capillary: 105 mg/dL — ABNORMAL HIGH (ref 70–99)

## 2013-10-25 NOTE — Progress Notes (Signed)
Physical Therapy Session Note  Patient Details  Name: [name] MRN: [id number] Date of Birth: 13-Sep-1945  Today's Date: 10/25/2013 PT Individual Time: 1405-1505 PT Individual Time Calculation (min): 60 min   Short Term Goals: Week 3:  PT Short Term Goal 1 (Week 3): Pt will transfer bed<>w/c w/ S w/o sliding board consistently PT Short Term Goal 2 (Week 3): Pt will roll R w/ MinA consistently PT Short Term Goal 3 (Week 3): Pt will propel w/c 100' w/ BLE and RUE consistently PT Short Term Goal 4 (Week 3): Pt will tolerate standing w/ therapy for 1 minute without   Skilled Therapeutic Interventions/Progress Updates:   Session focused on adjusting patient's new w/c in preparation for discharge, activity tolerance, sit <> stand, and ambulation. Pt received semi reclined in bed, transferred supine > sit with HOB slightly elevated and use of rail with supervision. Stand step turn transfer bed > w/c  using RW, min guard. W/c mobility using RUE/LE x 150 ft, supervision. Pt performed multiple sit <> stand transfers from bed, w/c, and mat table throughout session with min guard to min A, frequently requiring multiple attempts to achieve standing due to motivation to stand with decreased assist from lower surfaces. Gait training 2 x 45 ft using RW, min guard and vc's for upright trunk posture and increased hip extension to neutral. Pt reporting that new w/c cushion tends to slide forward as patient scoots forward in w/c in preparation for transfers. Velcro applied to w/c to increase patient safety with transfers and leg rests adjusted to appropriate length. Pt educated on w/c parts management. Pt reporting past episode of incontinence during session when asked if he would like to use bathroom before next therapy session. Returned to patient room where patient stood from w/c to have brief changed. Pt left sitting in w/c with RN present.   Therapy Documentation Precautions:  Precautions Precautions:  Cervical;Fall Precaution Comments: discussed log roll for cervical precautions Required Braces or Orthoses: Cervical Brace;Other Brace/Splint Cervical Brace: Hard collar;At all times Other Brace/Splint: don calf high ACE Wraps and Abd. binder Restrictions Weight Bearing Restrictions: No Vital Signs: Therapy Vitals Temp: 98.7 F (37.1 C) Temp src: Oral Pulse Rate: 91 Resp: 18 Patient Position (if appropriate): Sitting Oxygen Therapy SpO2: 100 % O2 Device: None (Room air) Pain: Pain Assessment Pain Assessment: 0-10 Pain Score: 3  Pain Type: Acute pain Pain Location: Shoulder Pain Orientation: Left Pain Descriptors / Indicators: Aching Pain Onset: On-going Pain Intervention(s): Rest Multiple Pain Sites: No Locomotion : Ambulation Ambulation/Gait Assistance: 4: Min guard Wheelchair Mobility Distance: 100   See FIM for current functional status  Therapy/Group: Individual Therapy  Kerney Elbe 10/25/2013, 4:28 PM

## 2013-10-25 NOTE — Progress Notes (Signed)
Physical Therapy Session Note  Patient Details  Name: [name] MRN: [id number] Date of Birth: 1946/01/24  Today's Date: 10/25/2013 PT Individual Time: 1505-1605 PT Individual Time Calculation (min): 60 min   Short Term Goals: Week 3:  PT Short Term Goal 1 (Week 3): Pt will transfer bed<>w/c w/ S w/o sliding board consistently PT Short Term Goal 2 (Week 3): Pt will roll R w/ MinA consistently PT Short Term Goal 3 (Week 3): Pt will propel w/c 100' w/ BLE and RUE consistently PT Short Term Goal 4 (Week 3): Pt will tolerate standing w/ therapy for 1 minute without   Skilled Therapeutic Interventions/Progress Updates:    W/C Management: PT instructs pt in w/c propulsion with B UE and blue nitrile glove on L hand for improved friction against push rim req occasional min A for power to L UE, but pt demonstrates ability to make a pincer grasp between thumb and first finger to hold the push rim. Pt is able to go 21' before being too tired to continue.   Therapeutic Activity: PT instructs pt in sit to stand with RW (no HO) req CGA for safety and stand-step transfer w/c to/from nu-step. PT instructs pt in sit to stand with RW req CGA for safety.   Therapeutic Exercise: PT instructs pt on nu-step at L4 with B LEs and R UE x 15 minutes, focus for pt to keep L hip in neutral rotation, as opposed to R hip ER.   Neuromuscular Reeducation: PT administers TUG and pt scores 143 seconds, indicating high falls risk.   Pt is continuing to progress with physical therapy, demonstrating ability to use RW without L HO, today. Pt reports that he felt the HO was starting to get in the way, anyway. Pt will benefit from continued therapy to work towards Independent PLOF, but this will have to be continued with pt in outpatient physical therapy after d/c.   Therapy Documentation Precautions:  Precautions Precautions: Cervical;Fall Precaution Comments: discussed log roll for cervical precautions Required  Braces or Orthoses: Cervical Brace;Other Brace/Splint Cervical Brace: Hard collar;At all times Other Brace/Splint: don calf high ACE Wraps and Abd. binder Restrictions Weight Bearing Restrictions: No Pain: Pain Assessment Pain Assessment: 0-10 Pain Score: 3  Pain Type: Acute pain Pain Location: Shoulder Pain Orientation: Left Pain Descriptors / Indicators: Aching Pain Onset: On-going Pain Intervention(s): Rest Multiple Pain Sites: No  Balance: Balance Balance Assessed: Yes Standardized Balance Assessment Standardized Balance Assessment: Timed Up and Go Test Timed Up and Go Test TUG: Normal TUG Normal TUG (seconds): 143  See FIM for current functional status  Therapy/Group: Individual Therapy  Tsering Leaman M 10/25/2013, 3:11 PM
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
## 2013-10-25 NOTE — Progress Notes (Signed)
Occupational Therapy Session Note  Patient Details  Name: Joshua Soulier MRN: [id number] Date of Birth: 1945-06-07  Today's Date: 10/25/2013 OT Individual Time: 0902-1002 OT Individual Time Calculation (min): 60 min   Short Term Goals: Week 3:  OT Short Term Goal 1 (Week 3): STGs = LTGs  Skilled Therapeutic Interventions/Progress Updates:  Patient sitting up in bed and finishing breakfast with wife at his side.  Patient declined shower as was the plan today yet did agree to sponge bath and dressing.  Patient report not having a good morning related to his bowel program therefore offered to assist him to sit on [facility] to see if he needed to finish his BM and he was agreeable.  Patient did not have any results however agreed to ask nursing to assist him to [facility] in the mornings.  This OT also spoke with charge RN to followup with nursing.  Patient completed a stand step transfer with RW bed>BSC>w/c with steady assist-close supervision.  Patient completed most of his sponge bath and dressing while seated on BSC using the reacher for donning pants.  Left foot noted to have increased edema therefore educated patient on need to elevate and perform ankle pumps to reduce risk of more edema.  Therapy Documentation Precautions:  Precautions Precautions: Cervical;Fall Precaution Comments: discussed log roll for cervical precautions Required Braces or Orthoses: Cervical Brace;Other Brace/Splint Cervical Brace: Hard collar;At all times Other Brace/Splint: don calf high ACE Wraps and Abd. binder Restrictions Weight Bearing Restrictions: No Pain: No report of pain ADL: See FIM for current functional status  Therapy/Group: Individual Therapy  Karey Suthers 10/25/2013, 8:32 AM

## 2013-10-25 NOTE — Progress Notes (Signed)
Physical Therapy Session Note  Patient Details  Name: [name] MRN: [id number] Date of Birth: April 29, 1945  Today's Date: 10/25/2013 PT Individual Time: 1005-1105 PT Individual Time Calculation (min): 60 min   Short Term Goals: Week 3:  PT Short Term Goal 1 (Week 3): Pt will transfer bed<>w/c w/ S w/o sliding board consistently PT Short Term Goal 2 (Week 3): Pt will roll R w/ MinA consistently PT Short Term Goal 3 (Week 3): Pt will propel w/c 100' w/ BLE and RUE consistently PT Short Term Goal 4 (Week 3): Pt will tolerate standing w/ therapy for 1 minute without   Skilled Therapeutic Interventions/Progress Updates:    Pt received seated in w/c, agreeable to participate in therapy. Pt moved sit<>stand w/ Min Guard A, managed LB dressing 50% (able to pull up R side of pants, required assist for L side) with CGA to remain standing. Session focused on functional endurance, ambulation, sit<>stands. Pt propelled w/c 150' w/ BLE and RUE, pt able to intermittently use LLE for propulsion but frequently kept it extended off floor. Pt ambulated 50' w/ RW and L HO in hospital hallway w/ CGA for guiding RW and intermittent manual facilitation on L hip abductors during stance. No L knee buckling or hyperextension noted. Propelled w/c 54' to rehab gym, performed SPT w/ RW and MinGuard w/c>mat table. Pt performed 2x3 sit<>stands from mat table w/ assist ranging from MinA progressing to SBA for last 5 transfers. Second set of sit<>stands from lower table height. SPT mat>w/c w/ RW and MinGuard. Pt propelled w/c 75' back to room, left seated in w/c w/ all needs within reach.  Therapy Documentation Precautions:  Precautions Precautions: Cervical;Fall Precaution Comments: discussed log roll for cervical precautions Required Braces or Orthoses: Cervical Brace;Other Brace/Splint Cervical Brace: Hard collar;At all times Other Brace/Splint: don calf high ACE Wraps and Abd. binder Restrictions Weight Bearing  Restrictions: No General:   Vital Signs: Therapy Vitals Temp: 98.1 F (36.7 C) Temp src: Oral Pulse Rate: 76 Resp: 18 BP: 142/65 mmHg Patient Position (if appropriate): Lying Oxygen Therapy SpO2: 99 % O2 Device: None (Room air) Pain: Pain Assessment Pain Assessment: 0-10 Pain Score: 6  Pain Type: Acute pain Pain Location: Shoulder Pain Orientation: Left Pain Descriptors / Indicators: Aching Pain Intervention(s): Medication (See eMAR) Multiple Pain Sites: No Mobility:   Locomotion :    Trunk/Postural Assessment :    Balance:   Exercises:   Other Treatments:    See FIM for current functional status  Therapy/Group: Individual Therapy  Hosie Spangle Hosie Spangle, PT, DPT 10/25/2013, 7:42 AM

## 2013-10-26 ENCOUNTER — Inpatient Hospital Stay (HOSPITAL_COMMUNITY): Payer: 59 | Admitting: Occupational Therapy

## 2013-10-26 ENCOUNTER — Inpatient Hospital Stay (HOSPITAL_COMMUNITY): Payer: 59

## 2013-10-26 ENCOUNTER — Inpatient Hospital Stay (HOSPITAL_COMMUNITY): Payer: 59 | Admitting: Physical Therapy

## 2013-10-26 ENCOUNTER — Encounter (HOSPITAL_COMMUNITY): Payer: 59 | Admitting: *Deleted

## 2013-10-26 LAB — GLUCOSE, CAPILLARY
Glucose-Capillary: 127 mg/dL — ABNORMAL HIGH (ref 70–99)
Glucose-Capillary: 79 mg/dL (ref 70–99)
Glucose-Capillary: 94 mg/dL (ref 70–99)
Glucose-Capillary: 136 mg/dL — ABNORMAL HIGH (ref 70–99)
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
## 2013-10-26 NOTE — Plan of Care (Signed)
Problem: RH Wheelchair Mobility Goal: LTG Patient will propel w/c in community environment (PT) LTG: Patient will propel wheelchair in community environment, # of feet with assist (PT)  Outcome: Not Met (add Reason) Pt is unable to negotiate ascending a ramp in w/c without assist.

## 2013-10-26 NOTE — Progress Notes (Signed)
Occupational Therapy Session & Discharge Summary  Patient Details  Name: Meagan Spease MRN: [id number] Date of Birth: [birth year]  Today's Date: 10/27/2013 OT Individual Time: 7412-8786 OT Individual Time Calculation (min): 40 min   Skilled Intervention: ADL-retraining with emphasis on functional transfers (stand-pivot), functional mobility using LRAD, discharge planning, home safety, and dynamic standing balance during assisted ADL.  Using walk-in shower and RW, pt ambulated to bathroom using and performed stand pivot transfer using grab bars and shower chair to perform shower, sitting and standing, with mod assist to lower legs and buttocks with additional assist to scrub head/hair d/t wound.  Pt demo's good thoroughness and awareness of deficits although now using LUE consistently at diminished level to maintain balance while standing and to dry himself.   Pt is able to dress sitting/standing and attempts to pull up pants but is unable to pass pants over buttocks.   Pt demo'd no LOB during tasks although he does require occasional minimum lift assist to rise from sitting to standing.      Pt states his wife, a CNA, is competent to assist as needed with any or all self-care if/when necessary.   Patient has met 9 of 9 long term goals due to improved activity tolerance, improved balance, postural control, ability to compensate for deficits and functional use of  LEFT upper extremity.  Patient to discharge at overall Min-Mod Assist level.  Patient's care partner is independent to provide the necessary physical assistance at discharge to assist with performance of BADL.    Reasons goals not met: n/a  Recommendation:  Patient will benefit from ongoing skilled OT services in home health setting to continue to advance functional skills in the area of BADL.  Equipment: Tub bench, BSC  Reasons for discharge: treatment goals met  Patient/family agrees with progress made and goals achieved: Yes  OT  Discharge Precautions/Restrictions  Precautions Precautions: Cervical;Fall Required Braces or Orthoses: Cervical Brace Cervical Brace: Hard collar;At all times Restrictions Weight Bearing Restrictions: No General OT Amount of Missed Time: 20 Minutes Vital Signs Therapy Vitals Temp: 98.8 F (37.1 C) Temp src: Oral Pulse Rate: 78 Resp: 18 BP: 156/68 mmHg Patient Position (if appropriate): Lying Oxygen Therapy SpO2: 96 % O2 Device: None (Room air) Pain Pain Assessment Pain Assessment: 0-10 Pain Score: 2  Pain Type: Acute pain Pain Location: Shoulder Pain Orientation: Left Pain Descriptors / Indicators: Aching Pain Onset: On-going Pain Intervention(s): Rest Multiple Pain Sites: No ADL ADL ADL Comments: see FIM Vision/Perception  Vision- History Baseline Vision/History: Wears glasses Wears Glasses: Reading only Vision- Assessment Vision Assessment?: No apparent visual deficits Perception Comments: WFL  Cognition Overall Cognitive Status: Within Functional Limits for tasks assessed Arousal/Alertness: Awake/alert Orientation Level: Oriented X4 Attention: Focused;Sustained;Selective;Alternating Alternating Attention: Appears intact Memory: Appears intact Awareness: Appears intact Problem Solving: Appears intact Safety/Judgment: Appears intact Sensation Sensation Light Touch: Impaired Detail Light Touch Impaired Details: Impaired LLE Stereognosis: Appears Intact Hot/Cold: Appears Intact Proprioception: Appears Intact Additional Comments: pt reports intact B ankle proprioception during gait Coordination Gross Motor Movements are Fluid and Coordinated: No Fine Motor Movements are Fluid and Coordinated: No Coordination and Movement Description: jerky movements in UEs/LEs due to weakness and pt utilizing power to obtain results;  R-UE [place] sufficient for ADL; L-UE [facility] impaired Finger Nose Finger Test: wfl on R; weakness in UE on L but accurate destination end point  and smooth path Heel Shin Test: wfl on R, weakness in LE on L but smooth movement Motor  Motor Motor: Other (  comment) Motor - Discharge Observations: weakness in L UE and R LE Mobility  Bed Mobility Bed Mobility: Rolling Left;Left Sidelying to Sit;Sit to Supine;Supine to Sit Rolling Left: 6: Modified independent (Device/Increase time) Left Sidelying to Sit: 6: Modified independent (Device/Increase time) Supine to Sit: 6: Modified independent (Device/Increase time) Sit to Supine: 6: Modified independent (Device/Increase time) Transfers Transfers: Sit to Stand;Stand to Sit Sit to Stand: 5: Supervision Sit to Stand Details: Verbal cues for technique Stand to Sit: 5: Supervision Stand to Sit Details (indicate cue type and reason): Verbal cues for precautions/safety  Trunk/Postural Assessment  Cervical Assessment Cervical Assessment: Exceptions to [facility] Cervical AROM Overall Cervical AROM: Deficits;Due to precautions Overall Cervical AROM Comments: hard c-collar on at all times Thoracic Assessment Thoracic Assessment: Within Functional Limits Lumbar Assessment Lumbar Assessment: Within Functional Limits Postural Control Postural Control: Deficits on evaluation Head Control: limited by hard c-collar on at all times Righting Reactions: delayed Postural Limitations: rounded shoulders  Balance Balance Balance Assessed: Yes Standardized Balance Assessment Standardized Balance Assessment: Timed Up and Go Test Timed Up and Go Test TUG: Normal TUG Normal TUG (seconds): 143 Static Sitting Balance Static Sitting - Balance Support: Feet supported Static Sitting - Level of Assistance: 7: Independent Dynamic Sitting Balance Dynamic Sitting - Balance Support: Feet supported;During functional activity Dynamic Sitting - Level of Assistance: 6: Modified independent (Device/Increase time) Dynamic Sitting - Balance Activities: Reaching for objects Static Standing Balance Static Standing -  Balance Support: During functional activity;Bilateral upper extremity supported Static Standing - Level of Assistance: 4: Min assist Dynamic Standing Balance Dynamic Standing - Balance Support: During functional activity;Bilateral upper extremity supported Dynamic Standing - Level of Assistance: 4: Min assist Dynamic Standing - Balance Activities: Reaching for objects Extremity/Trunk Assessment RUE Assessment RUE Assessment: Exceptions to [facility] RUE AROM (degrees) Overall AROM Right Upper Extremity: Deficits;Due to precautions RUE Overall AROM Comments: R arm flexion to 90 degrees - limited by cervical collar and neck precautions.  RUE Strength RUE Overall Strength: Deficits RUE Overall Strength Comments: grip 4/5, elbow and shoulder in available ROM 5/5 LUE Assessment LUE Assessment: Exceptions to WFL LUE AROM (degrees) LUE Overall AROM Comments: arm flexion 45 degrees; elbow wfl; grip 50% LUE Strength LUE Overall Strength: Deficits;Due to pain LUE Overall Strength Comments: arm flexion 2+/5, elbow grossly 3+/5, grip 2+/5  See FIM for current functional status  Keyasha Miah 10/27/2013, 7:03 AM

## 2013-10-26 NOTE — Progress Notes (Signed)
Social Work Patient ID: [name], male   DOB: May 10, 1945, 68 y.o.   MRN: [id number]  CSW met with pt and his wife to update them on team conference discussion.  They are pleased that pt is on target to d/c on 10-27-13.  CSW answered questions and arranged HH and DME.  Wife wondered about SS disability.  CSW explained that since pt already receives SS, he cannot receive disability as well.  They expressed understanding.  Pt is looking forward to going home.

## 2013-10-26 NOTE — Discharge Summary (Signed)
Discharge summary job [id number]
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
## 2013-10-26 NOTE — Progress Notes (Signed)
Occupational Therapy Session Note  Patient Details  Name: [name] MRN: [id number] Date of Birth: 1945-11-19  Today's Date: 10/26/2013 OT Individual Time: 1401-1501 OT Individual Time Calculation (min): 60 min    Short Term Goals: Week 3:  OT Short Term Goal 1 (Week 3): STGs = LTGs  Skilled Therapeutic Interventions/Progress Updates:  Upon entering the room, pt supine in bed. Pt with 4/10 pain in L shoulder. RN notified and pain medication given to patient. Pt performing supine to sit with supervision and use of bed rail. Pt also performed without bed rail and required Min A for trunk while pushing up from bed with R UE. Pt seated on on EOB for session. OT educated and demonstrated [facility] HEP for L hand in order to increase functioning, strength, and decrease edema. Pt returned demonstration of exercises with use of paper handout and increased time. Pt utilized yellow, soft theraputty and performed 3 reps of each exercise. Pt given instructions to perform HEP 2x/daily in order to increase strength and decrease edema in L hand. OT also performed retrograde massage to L hand and wrist in order to decrease edema. L hand appeared to be significantly better after ~ 10 minutes.  Therapy Documentation Precautions:  Precautions Precautions: Cervical;Fall Precaution Comments: discussed log roll for cervical precautions Required Braces or Orthoses: Cervical Brace Cervical Brace: Hard collar;At all times Other Brace/Splint: don calf high ACE Wraps and Abd. binder Restrictions Weight Bearing Restrictions: No Pain: Pain Assessment Pain Assessment: 0-10 Pain Score: 4  Pain Type: Acute pain Pain Location: Shoulder Pain Orientation: Left Pain Descriptors / Indicators: Aching Pain Frequency: Intermittent Pain Onset: On-going Patients Stated Pain Goal: 2 Pain Intervention(s): Rest Multiple Pain Sites: No  See FIM for current functional status  Therapy/Group: Individual Therapy  Lowella Grip 10/26/2013, 4:59 PM

## 2013-10-26 NOTE — Progress Notes (Signed)
Physical Therapy Discharge Summary  Patient Details  Name: [name] MRN: [id number] Date of Birth: 05-01-1945  Today's Date: 10/26/2013 PT Individual Time: 1040-1140 Tx 2: 1500-1600 PT Individual Time Calculation (min): 60 min  Tx 2: 60 min   Patient has met 6 of 7 long term goals due to improved activity tolerance, improved balance, improved postural control, increased strength, increased range of motion, decreased pain, ability to compensate for deficits, functional use of  right upper extremity, right lower extremity, left upper extremity and left lower extremity and improved coordination.  Patient to discharge at a wheelchair level Supervision.   Patient's care partner is independent to provide the necessary physical assistance at discharge.  Reasons goals not met: Pt continues to require assist when ascending/descending a ramp due to generalized weakness, and so mod I community w/c management is not met, but other w/c goals in a controlled environment and in a home environment are met.   Recommendation:  Patient will benefit from ongoing skilled PT services in outpatient setting to continue to advance safe functional mobility, address ongoing impairments in safety with all functional mobility, balance, activity tolerance, return to PLOF, and minimize fall risk.  Equipment: manual w/c with B brake extenders, swing away legrests, and basic cushion  Reasons for discharge: discharge from hospital  Patient/family agrees with progress made and goals achieved: Yes  Therapeutic Interventions: Community Integration: Pt demonstrates mod I w/c propulsion x 300' on level surface with R UE/R LE. Pt demonstrates mod I w/c propulsion in the community on various floor surfaces: brick, carpet, tile, but req assist to ascend a ramp, and so SBA for community propulsion. Pt demonstrates ability to manage w/c parts: brakes and doffing legrests   Gait Training: PT instructs pt in ambulation with RW x  110' req CGA for safety - pt flexes trunk in order to gaze at his feet due to L LE weakness so that he does not trip on his feet.   Therapeutic Activity: PT instructs pt in w/c to recliner transfer via stand-step with RW req CGA for safety. PT instructs pt in sit to stand from w/c prior to gait req CGA for safety.   Session 2: Therapeutic Activity: PT instructs pt in sit to stand and stand step transfer with RW req SBA for safety and verbal cues for hand placement. Pt demonstrates mod I for all bed mobility to the R and to the L sit to/from supine and rolling without rails; pt utilizes momentum. PT instructs pt in car transfer with RW req min A to get out of low car with RW stand-step transfer and SBA to transfer in to the low car (pt will be transported in a [facility]).   Pt has progressed well with inpatient rehab and is appropriate to d/c home with follow up outpatient physical therapy.     PT Discharge Precautions/Restrictions Precautions Precautions: Cervical;Fall Required Braces or Orthoses: Cervical Brace Cervical Brace: Hard collar;At all times Restrictions Weight Bearing Restrictions: No Vital Signs Therapy Vitals Temp: 97.9 F (36.6 C) Temp src: Oral Pulse Rate: 82 Resp: 18 BP: 131/59 mmHg Patient Position (if appropriate): Sitting Oxygen Therapy SpO2: 100 % O2 Device: None (Room air) Pain Pain Assessment Pain Assessment: 0-10 Pain Score: 2  Pain Type: Acute pain Pain Location: Shoulder Pain Orientation: Left Pain Descriptors / Indicators: Aching Pain Onset: On-going Pain Intervention(s): Rest Multiple Pain Sites: No Session 2: Pt c/o 4/10 L shoulder pain; PT uitlizes rest to decrease the pain.  Vision/Perception  Perception Comments: wfl  Cognition Overall Cognitive Status: Within Functional Limits for tasks assessed Arousal/Alertness: Awake/alert Orientation Level: Oriented X4 Attention: Focused;Sustained;Selective;Alternating Focused Attention:  Appears intact Focused Attention Impairment: Functional basic Sustained Attention: Appears intact Sustained Attention Impairment: Functional basic Selective Attention: Appears intact Selective Attention Impairment: Functional basic Alternating Attention: Appears intact Alternating Attention Impairment: Functional basic Memory: Appears intact Awareness: Appears intact Problem Solving: Appears intact Problem Solving Impairment: Functional basic Reasoning: Appears intact Safety/Judgment: Appears intact Sensation Sensation Light Touch: Impaired Detail Light Touch Impaired Details: Impaired LLE (L foot diminished to LT due to being slightly swollen) Proprioception: Appears Intact Additional Comments: pt reports intact B ankle proprioception during gait Coordination Gross Motor Movements are Fluid and Coordinated: No Fine Motor Movements are Fluid and Coordinated: Not tested Coordination and Movement Description: jerky movements in UEs/LEs due to weakness and pt utilizing power to obtain results Finger Nose Finger Test: wfl on R; weakness in UE on L but accurate destination end point and smooth path Heel Shin Test: wfl on R, weakness in LE on L but smooth movement Motor  Motor Motor: Other (comment) Motor - Discharge Observations: weakness in L UE and R LE  Mobility Bed Mobility Bed Mobility: Rolling Left;Left Sidelying to Sit;Sit to Supine;Supine to Sit Rolling Left: 6: Modified independent (Device/Increase time) Left Sidelying to Sit: 6: Modified independent (Device/Increase time) Supine to Sit: 6: Modified independent (Device/Increase time) (to R) Sit to Supine: 6: Modified independent (Device/Increase time) Transfers Transfers: Yes Sit to Stand: 5: Supervision Sit to Stand Details: Verbal cues for technique Sit to Stand Details (indicate cue type and reason): place your L hand symmetrically to how your R hand is placed (on the bed) Stand Pivot Transfers: 5: Supervision Stand  Pivot Transfer Details: Verbal cues for technique Stand Pivot Transfer Details (indicate cue type and reason): be careful when you step backwards Lateral/Scoot Transfers: 4: Min assist Lateral/Scoot Transfer Details: Manual facilitation for placement Lateral/Scoot Transfer Details (indicate cue type and reason): Supervision chair to bed to R; min A bed to chair to L due to brake extension getting caught on pt's pants.  Locomotion  Ambulation Ambulation: Yes Ambulation/Gait Assistance: 4: Min guard Ambulation Distance (Feet): 110 Feet Assistive device: Rolling walker Gait Gait: Yes Gait Pattern: Decreased hip/knee flexion - left;Step-through pattern;Trunk flexed Gait velocity: slow Stairs / Additional Locomotion Stairs: No Architect: Yes Wheelchair Assistance: 5: Investment banker, operational Details: Verbal cues for Marketing executive: Right upper extremity;Right lower extremity Wheelchair Parts Management: Supervision/cueing Distance: 500  Trunk/Postural Assessment  Cervical Assessment Cervical Assessment: Exceptions to [facility] Cervical AROM Overall Cervical AROM: Deficits;Due to precautions Overall Cervical AROM Comments: hard c-collar on at all times Thoracic Assessment Thoracic Assessment: Within Functional Limits Lumbar Assessment Lumbar Assessment: Within Functional Limits Postural Control Postural Control: Deficits on evaluation Head Control: limited by hard c-collar on at all times Righting Reactions: delayed Postural Limitations: rounded shoulders  Balance Balance Balance Assessed: Yes Static Sitting Balance Static Sitting - Balance Support: Feet supported Static Sitting - Level of Assistance: 7: Independent Dynamic Sitting Balance Dynamic Sitting - Balance Support: Feet supported;During functional activity Dynamic Sitting - Level of Assistance: 6: Modified independent (Device/Increase time) Dynamic Sitting - Balance  Activities: Reaching for objects Static Standing Balance Static Standing - Balance Support: During functional activity;Bilateral upper extremity supported Static Standing - Level of Assistance: 4: Min assist Dynamic Standing Balance Dynamic Standing - Balance Support: During functional activity;Bilateral upper extremity supported Dynamic Standing - Level of Assistance: 4: Min  assist Dynamic Standing - Balance Activities: Reaching for objects Extremity Assessment  RUE Assessment RUE Assessment: Exceptions to [facility] RUE AROM (degrees) Overall AROM Right Upper Extremity: Deficits;Due to precautions RUE Overall AROM Comments: R arm flexion to 90 degrees - limited by cervical collar and neck precautions.  RUE Strength RUE Overall Strength: Deficits RUE Overall Strength Comments: grip 4/5, elbow and shoulder in available ROM 5/5 LUE Assessment LUE Assessment: Exceptions to WFL LUE AROM (degrees) Overall AROM Left Upper Extremity: Deficits;Due to pain;Due to precautions LUE Overall AROM Comments: arm flexion 45 degrees; elbow wfl; grip 50% LUE Strength LUE Overall Strength: Deficits;Due to pain LUE Overall Strength Comments: arm flexion 2+/5, elbow grossly 3+/5, grip 2+/5 RLE Assessment RLE Assessment: Exceptions to [facility] RLE AROM (degrees) Overall AROM Right Lower Extremity: Deficits;Due to premorbid status RLE Overall AROM Comments: very tight piriformis muscle, but otherwise wfl RLE PROM (degrees) Overall PROM Right Lower Extremity: Within functional limits for tasks assessed RLE Strength RLE Overall Strength: Deficits RLE Overall Strength Comments: hip grossly 3+/5, knee grossly 5/5, ankle DF 5/5 LLE Assessment LLE Assessment: Exceptions to WFL LLE AROM (degrees) Overall AROM Left Lower Extremity: Deficits;Due to decreased strength LLE PROM (degrees) LLE Overall PROM Comments: very tight piriformis muscle premorbidly LLE Strength LLE Overall Strength: Deficits LLE Overall Strength  Comments: hip flexion 3-/5, knee grossly 4/5, ankle DF 3+/5  See FIM for current functional status  Gunda Maqueda M 10/26/2013, 11:52 AM
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
## 2013-10-27 ENCOUNTER — Encounter (HOSPITAL_COMMUNITY): Payer: 59 | Admitting: *Deleted

## 2013-10-27 LAB — GLUCOSE, CAPILLARY: Glucose-Capillary: 118 mg/dL — ABNORMAL HIGH (ref 70–99)

## 2013-10-27 MED ORDER — HYDROCODONE-ACETAMINOPHEN 5-325 MG PO TABS: 1.0000 | ORAL_TABLET | Freq: Four times a day (QID) | ORAL | Status: DC | PRN

## 2013-10-27 MED ORDER — BISACODYL 10 MG RE SUPP: 10.0000 mg | Freq: Every day | RECTAL | Status: DC

## 2013-10-27 MED ORDER — SENNOSIDES-DOCUSATE SODIUM 8.6-50 MG PO TABS: 3.0000 | ORAL_TABLET | Freq: Two times a day (BID) | ORAL | Status: DC

## 2013-10-27 MED ORDER — TAMSULOSIN HCL 0.4 MG PO CAPS: 0.4000 mg | ORAL_CAPSULE | Freq: Every day | ORAL | Status: DC

## 2013-10-27 MED ORDER — NAPROXEN 500 MG PO TABS: 500.0000 mg | ORAL_TABLET | Freq: Two times a day (BID) | ORAL | Status: DC

## 2013-10-27 MED ORDER — BACLOFEN 10 MG PO TABS: 10.0000 mg | ORAL_TABLET | Freq: Every evening | ORAL | Status: DC | PRN

## 2013-10-27 NOTE — Progress Notes (Signed)
Patient discharged home.  Left floor via wheelchair, escorted by nursing staff and family.  Patient and wife verbalized understanding of discharge instructions as given by Marissa Nestle, PA, no additional questions asked to nursing.  All patient belongings taken with patient.  VSS.  Appears to be in no immediate distress at this time.  Dani Gobble, RN

## 2013-10-27 NOTE — Progress Notes (Signed)
Social Work Discharge Note  The overall goal for the admission was met for:   Discharge location: Yes - home  Length of Stay: Yes - 28 das  Discharge activity level: Yes - mod I with w/c - moderate assistance   Home/community participation: Yes  Services provided included: MD, RD, PT, OT, SLP, RN, TR, Pharmacy and SW  Financial Services: Private Insurance: [employer]  Follow-up services arranged: Home Health: PT/OT/RN, DME: hospital bed, drop arm commode, tub bench, 30" slide board, 20x18 w/c with 20x18 basic cushion and Other: wife purchased rolling walker  Comments (or additional information):  Pt to receive f/u therapies and home nursing from [facility].  DME was also provided by [facility].  Patient/Family verbalized understanding of follow-up arrangements: Yes  Individual responsible for coordination of the follow-up plan: pt and his wife  Confirmed correct DME delivered: [name] 10/27/2013    Falynn Ailey, Silvestre Mesi

## 2013-10-27 NOTE — Discharge Instructions (Signed)
Inpatient Rehab Discharge Instructions  [name] Discharge date and time: 10/27/13   Activities/Precautions/ Functional Status: Activity:  cervical collar at all times Diet: diabetic diet Wound Care: Routine skin care  Functional status:  ___ No restrictions     ___ Walk up steps independently __X_ 24/7 supervision/assistance   ___ Walk up steps with assistance ___ Intermittent supervision/assistance  ___ Bathe/dress independently ___ Walk with walker     _X__ Bathe/dress with assistance ___ Walk Independently    ___ Shower independently ___ Walk with assistance    ___ Shower with assistance _X__ No alcohol     ___ Return to work/school ________  COMMUNITY REFERRALS UPON DISCHARGE:   Home Health:   PT     OT     RN  Agency:  [facility] Phone:  [phone] Medical Equipment/Items Ordered:  Hospital bed, drop arm commode, tub bench, 30" slide board; 20x18 wheelchair with 20x18 basic cushion  Agency/Supplier:  [facility]   Phone:  [phone] Other:  You purchased rolling walker on your own.    Special Instructions: Cervical collar at all times   Scrub and wash scalp laceration daily. May apply a thin layer of antibiotic ointment daily.   My questions have been answered and I understand these instructions. I will adhere to these goals and the provided educational materials after my discharge from the hospital.  Patient/Caregiver Signature _______________________________ Date __________  Clinician Signature _______________________________________ Date __________  Please bring this form and your medication list with you to all your follow-up doctor's appointments.
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
## 2013-11-09 ENCOUNTER — Other Ambulatory Visit: Payer: Self-pay | Admitting: Neurosurgery

## 2013-11-09 DIAGNOSIS — S129XXA Fracture of neck, unspecified, initial encounter: Secondary | ICD-10-CM

## 2013-11-10 ENCOUNTER — Inpatient Hospital Stay: Payer: 59 | Admitting: Physical Medicine & Rehabilitation

## 2013-11-15 DIAGNOSIS — IMO0002 Reserved for concepts with insufficient information to code with codable children: Secondary | ICD-10-CM | POA: Diagnosis not present

## 2013-11-15 DIAGNOSIS — E119 Type 2 diabetes mellitus without complications: Secondary | ICD-10-CM | POA: Diagnosis not present

## 2013-11-15 DIAGNOSIS — I10 Essential (primary) hypertension: Secondary | ICD-10-CM | POA: Diagnosis not present

## 2013-11-15 DIAGNOSIS — S0100XA Unspecified open wound of scalp, initial encounter: Secondary | ICD-10-CM | POA: Diagnosis not present

## 2013-11-16 ENCOUNTER — Ambulatory Visit
Admission: RE | Admit: 2013-11-16 | Discharge: 2013-11-16 | Disposition: A | Discharge status: Home / Self Care | Payer: 59 | Source: Ambulatory Visit | Attending: Neurosurgery | Admitting: Neurosurgery

## 2013-11-16 DIAGNOSIS — S129XXA Fracture of neck, unspecified, initial encounter: Secondary | ICD-10-CM

## 2013-12-01 ENCOUNTER — Encounter: Payer: 59 | Attending: Physical Medicine & Rehabilitation | Admitting: Physical Medicine & Rehabilitation

## 2013-12-01 ENCOUNTER — Encounter: Payer: Self-pay | Admitting: Physical Medicine & Rehabilitation

## 2013-12-01 VITALS — HR 80 | Resp 14 | Ht 70.0 in | Wt 215.6 lb

## 2013-12-01 DIAGNOSIS — K592 Neurogenic bowel, not elsewhere classified: Secondary | ICD-10-CM | POA: Insufficient documentation

## 2013-12-01 DIAGNOSIS — N319 Neuromuscular dysfunction of bladder, unspecified: Secondary | ICD-10-CM | POA: Diagnosis not present

## 2013-12-01 DIAGNOSIS — X58XXXS Exposure to other specified factors, sequela: Secondary | ICD-10-CM | POA: Diagnosis not present

## 2013-12-01 DIAGNOSIS — M7502 Adhesive capsulitis of left shoulder: Secondary | ICD-10-CM | POA: Diagnosis not present

## 2013-12-01 DIAGNOSIS — S12200S Unspecified displaced fracture of third cervical vertebra, sequela: Secondary | ICD-10-CM | POA: Diagnosis not present

## 2013-12-01 DIAGNOSIS — M25512 Pain in left shoulder: Secondary | ICD-10-CM | POA: Insufficient documentation

## 2013-12-01 DIAGNOSIS — S12090S Other displaced fracture of first cervical vertebra, sequela: Secondary | ICD-10-CM | POA: Insufficient documentation

## 2013-12-01 DIAGNOSIS — E1140 Type 2 diabetes mellitus with diabetic neuropathy, unspecified: Secondary | ICD-10-CM | POA: Diagnosis not present

## 2013-12-01 DIAGNOSIS — S14159S Other incomplete lesion at unspecified level of cervical spinal cord, sequela: Secondary | ICD-10-CM | POA: Insufficient documentation

## 2013-12-01 DIAGNOSIS — S14109D Unspecified injury at unspecified level of cervical spinal cord, subsequent encounter: Secondary | ICD-10-CM

## 2013-12-01 DIAGNOSIS — S129XXD Fracture of neck, unspecified, subsequent encounter: Secondary | ICD-10-CM

## 2013-12-01 DIAGNOSIS — S0101XS Laceration without foreign body of scalp, sequela: Secondary | ICD-10-CM

## 2013-12-01 NOTE — Progress Notes (Signed)
Subjective:    Patient ID: [name], male    DOB: 1945-07-08, 68 y.o.   MRN: [id number]  HPI  [name] is back regarding his cervical fractures and SCI. He is completing HH therapies. He is starting outpt therapy in about a week or so. He is continent of bowels and bladder.   His biggest pain in the left shoulder which remains tight. He is working on ROM with therapy and with his wife at home. He denies headaces.   He still feels a little weak. He can dress with assist. He goes to the bathroom on his own. He is using a rolling walker at home and wheelchair for dx outside the house.   He remains in his cervical collar per NS recs.   He and his wife are planning to go on a cruise starting this weekend.    Pain Inventory Average Pain 3 Pain Right Now 2 My pain is intermittent and aching  In the last 24 hours, has pain interfered with the following? General activity 2 Relation with others 4 Enjoyment of life 5 What TIME of day is your pain at its worst? morning Sleep (in general) Good  Pain is worse with: walking Pain improves with: medications Relief from Meds: 6  Mobility use a wheelchair  Function employed # of hrs/week 40  Neuro/Psych No problems in this area  Prior Studies Any changes since last visit?  yes  Physicians involved in your care Any changes since last visit?  yes   History reviewed. No pertinent family history. History   Social History  . Marital Status: Single    Spouse Name: N/A    Number of Children: N/A  . Years of Education: N/A   Social History Main Topics  . Smoking status: Never Smoker   . Smokeless tobacco: None  . Alcohol Use: No  . Drug Use: No  . Sexual Activity: None   Other Topics Concern  . None   Social History Narrative  . None   History reviewed. No pertinent past surgical history. Past Medical History  Diagnosis Date  . Diabetes mellitus without complication   . Hypertension    Pulse 80  Resp 14  Ht  5\' 10"  (1.778 m)  Wt 215 lb 9.6 oz (97.796 kg)  BMI 30.94 kg/m2  SpO2 99%  Opioid Risk Score:   Fall Risk Score:     Review of Systems     Objective:   Physical Exam  HEENT:wound looks great, granulating, drying up  -  Cardio: RRR and no murmur  Resp: CTA B/L and unlabored  GI: BS positive and NT,ND  Extremity: Pulses positive. mild Edema LUE tr to 1+ Skin: Intact.  Neuro: Flat, Abnormal Sensory pt reports equal LT and proprio BLE , Abnormal Motor   4+ R delt, bi, tri, grip,HF, KE ADF 4+.  Left: 4- delt bi, tri, grip, and Left HF, KE, ADF are 4- as well.    Musc/Skel: . Left shoulder contracture present---only PROM to 75 degrees, pain with. Left pec major tight. Reduced ROM and pain with Flexion,extension and IR also  Tone minimal. DTR's 3+. . Gen NAD, in W/C Psych: mood pleasant and appropriate.   Assessment/Plan:  1. Functional deficits secondary to C-spine fractures ant arch C1,as well as C3 spinous fracture with INCOMPLETE SCI    -[facility] within the next 10 days or so 3. Pain Management: prn hydrocodone, naproxen -pain minimal at this point 4. Diabetes mellitus  with peripheral neuropathy.  5. Adhesive capsulitis left shoulder--- -discussed aggressive ROM, therapy -?steroid injection to allow easier ROM -take naproxen and/or hydrocodone before rehab efforts.  6. Skin/Wound Care:  Wound essentially healed 7. Neurogenic bowel and bladder---bladder and bowel now functional

## 2013-12-01 NOTE — Patient Instructions (Addendum)
PLEASE CALL ME WITH ANY PROBLEMS OR QUESTIONS [phone]).     WORK ON YOUR LEFT SHOULDER RANGE OF MOTION EVERY DAY!!!

## 2013-12-13 ENCOUNTER — Ambulatory Visit (HOSPITAL_COMMUNITY)
Admission: RE | Admit: 2013-12-13 | Discharge: 2013-12-13 | Disposition: A | Discharge status: Home / Self Care | Payer: 59 | Source: Ambulatory Visit | Attending: Pulmonary Disease | Admitting: Pulmonary Disease

## 2013-12-13 DIAGNOSIS — M6281 Muscle weakness (generalized): Secondary | ICD-10-CM | POA: Insufficient documentation

## 2013-12-13 DIAGNOSIS — R2689 Other abnormalities of gait and mobility: Secondary | ICD-10-CM

## 2013-12-13 DIAGNOSIS — R262 Difficulty in walking, not elsewhere classified: Secondary | ICD-10-CM | POA: Insufficient documentation

## 2013-12-13 DIAGNOSIS — Z5189 Encounter for other specified aftercare: Secondary | ICD-10-CM | POA: Insufficient documentation

## 2013-12-13 DIAGNOSIS — M7502 Adhesive capsulitis of left shoulder: Secondary | ICD-10-CM | POA: Insufficient documentation
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
## 2013-12-18 ENCOUNTER — Ambulatory Visit (HOSPITAL_COMMUNITY)
Admission: RE | Admit: 2013-12-18 | Discharge: 2013-12-18 | Disposition: A | Discharge status: Home / Self Care | Payer: 59 | Source: Ambulatory Visit | Attending: Pulmonary Disease | Admitting: Pulmonary Disease

## 2013-12-18 DIAGNOSIS — M7502 Adhesive capsulitis of left shoulder: Secondary | ICD-10-CM

## 2013-12-18 DIAGNOSIS — S14109D Unspecified injury at unspecified level of cervical spinal cord, subsequent encounter: Secondary | ICD-10-CM

## 2013-12-18 DIAGNOSIS — M6281 Muscle weakness (generalized): Secondary | ICD-10-CM

## 2013-12-18 DIAGNOSIS — M25519 Pain in unspecified shoulder: Secondary | ICD-10-CM | POA: Insufficient documentation

## 2013-12-18 DIAGNOSIS — M25512 Pain in left shoulder: Secondary | ICD-10-CM

## 2013-12-18 DIAGNOSIS — Z5189 Encounter for other specified aftercare: Secondary | ICD-10-CM | POA: Diagnosis not present
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
## 2013-12-19 ENCOUNTER — Ambulatory Visit (HOSPITAL_COMMUNITY)
Admission: RE | Admit: 2013-12-19 | Discharge: 2013-12-19 | Disposition: A | Discharge status: Home / Self Care | Payer: 59 | Source: Ambulatory Visit | Attending: Pulmonary Disease | Admitting: Pulmonary Disease

## 2013-12-19 ENCOUNTER — Ambulatory Visit (HOSPITAL_COMMUNITY): Payer: 59

## 2013-12-19 DIAGNOSIS — Z5189 Encounter for other specified aftercare: Secondary | ICD-10-CM | POA: Diagnosis not present
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
## 2013-12-21 ENCOUNTER — Ambulatory Visit (HOSPITAL_COMMUNITY)
Admission: RE | Admit: 2013-12-21 | Discharge: 2013-12-21 | Disposition: A | Discharge status: Home / Self Care | Payer: 59 | Source: Ambulatory Visit | Attending: Pulmonary Disease | Admitting: Pulmonary Disease

## 2013-12-21 DIAGNOSIS — Z5189 Encounter for other specified aftercare: Secondary | ICD-10-CM | POA: Diagnosis not present

## 2013-12-21 DIAGNOSIS — M25512 Pain in left shoulder: Secondary | ICD-10-CM
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
## 2013-12-21 NOTE — Progress Notes (Signed)
Note reviewed by clinical instructor and accurately reflects treatment session.  Laura Essenmacher, OTR/L,CBIS   

## 2013-12-22 ENCOUNTER — Ambulatory Visit (HOSPITAL_COMMUNITY): Payer: 59

## 2013-12-26 ENCOUNTER — Encounter (HOSPITAL_COMMUNITY): Payer: Self-pay

## 2013-12-26 ENCOUNTER — Ambulatory Visit (HOSPITAL_COMMUNITY)
Admission: RE | Admit: 2013-12-26 | Discharge: 2013-12-26 | Disposition: A | Discharge status: Home / Self Care | Payer: 59 | Source: Ambulatory Visit | Attending: Pulmonary Disease | Admitting: Pulmonary Disease

## 2013-12-26 DIAGNOSIS — M7502 Adhesive capsulitis of left shoulder: Secondary | ICD-10-CM | POA: Insufficient documentation

## 2013-12-26 DIAGNOSIS — M25512 Pain in left shoulder: Secondary | ICD-10-CM

## 2013-12-26 DIAGNOSIS — R262 Difficulty in walking, not elsewhere classified: Secondary | ICD-10-CM

## 2013-12-26 DIAGNOSIS — S129XXS Fracture of neck, unspecified, sequela: Secondary | ICD-10-CM

## 2013-12-26 DIAGNOSIS — R2689 Other abnormalities of gait and mobility: Secondary | ICD-10-CM

## 2013-12-26 DIAGNOSIS — Z5189 Encounter for other specified aftercare: Secondary | ICD-10-CM | POA: Diagnosis present

## 2013-12-26 DIAGNOSIS — M6281 Muscle weakness (generalized): Secondary | ICD-10-CM

## 2013-12-26 DIAGNOSIS — S14109D Unspecified injury at unspecified level of cervical spinal cord, subsequent encounter: Secondary | ICD-10-CM

## 2013-12-26 NOTE — Therapy (Cosign Needed)
   12/26/13 0700  Shoulder Exercises: Supine  Protraction PROM;10 reps  Horizontal ABduction PROM;10 reps  External Rotation PROM;10 reps  Internal Rotation PROM;10 reps  Flexion PROM;10 reps  ABduction PROM;10 reps  Shoulder Exercises: Seated  Extension AROM;10 reps  Row AROM;10 reps  Elbow Exercises  Elbow Flexion PROM;AROM;10 reps  Elbow Extension PROM;AROM;10 reps  Forearm Supination PROM;10 reps  Forearm Pronation PROM;10 reps  Wrist Flexion PROM;AROM;10 reps  Wrist Extension PROM;AROM;10 reps  Work Hardening Exercises  Stationary Bike NuStep;8' seat 12 level 2 manual  Manual Therapy  Manual Therapy Myofascial release  Myofascial Release Myofascial release (MFR) and manual stretching to left bicep, upper arm, anterior shoulder, upper trap, and scapular regions to decrease pain and tightness and promote improved ROM.
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
## 2013-12-27 ENCOUNTER — Encounter (HOSPITAL_COMMUNITY): Payer: Self-pay

## 2013-12-27 ENCOUNTER — Ambulatory Visit (HOSPITAL_COMMUNITY): Payer: 59

## 2013-12-27 ENCOUNTER — Ambulatory Visit (HOSPITAL_COMMUNITY)
Admission: RE | Admit: 2013-12-27 | Discharge: 2013-12-27 | Disposition: A | Discharge status: Home / Self Care | Payer: 59 | Source: Ambulatory Visit | Attending: Pulmonary Disease | Admitting: Pulmonary Disease

## 2013-12-27 DIAGNOSIS — R2689 Other abnormalities of gait and mobility: Secondary | ICD-10-CM

## 2013-12-27 DIAGNOSIS — M6281 Muscle weakness (generalized): Secondary | ICD-10-CM

## 2013-12-27 DIAGNOSIS — R262 Difficulty in walking, not elsewhere classified: Secondary | ICD-10-CM

## 2013-12-27 DIAGNOSIS — S14109D Unspecified injury at unspecified level of cervical spinal cord, subsequent encounter: Secondary | ICD-10-CM

## 2013-12-27 DIAGNOSIS — Z5189 Encounter for other specified aftercare: Secondary | ICD-10-CM | POA: Diagnosis not present

## 2013-12-27 DIAGNOSIS — M7502 Adhesive capsulitis of left shoulder: Secondary | ICD-10-CM

## 2013-12-27 DIAGNOSIS — M25512 Pain in left shoulder: Secondary | ICD-10-CM

## 2013-12-27 HISTORY — DX: Unspecified cataract: H26.9

## 2013-12-27 HISTORY — DX: Unspecified osteoarthritis, unspecified site: M19.90

## 2013-12-27 NOTE — Addendum Note (Signed)
Encounter addended by: Jeanene ErbLaura D Aleksandr Pellow, OT on: 12/27/2013  4:39 PM<BR>     Documentation filed: Fast Note
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
## 2013-12-28 ENCOUNTER — Other Ambulatory Visit: Payer: Self-pay | Admitting: Physical Medicine and Rehabilitation

## 2013-12-29 ENCOUNTER — Ambulatory Visit (HOSPITAL_COMMUNITY)
Admission: RE | Admit: 2013-12-29 | Discharge: 2013-12-29 | Disposition: A | Discharge status: Home / Self Care | Payer: 59 | Source: Ambulatory Visit | Attending: Pulmonary Disease | Admitting: Pulmonary Disease

## 2013-12-29 ENCOUNTER — Ambulatory Visit (HOSPITAL_COMMUNITY): Payer: 59

## 2013-12-29 ENCOUNTER — Ambulatory Visit (HOSPITAL_COMMUNITY): Payer: 59 | Admitting: Physical Therapy

## 2013-12-29 DIAGNOSIS — M6281 Muscle weakness (generalized): Secondary | ICD-10-CM

## 2013-12-29 DIAGNOSIS — Z5189 Encounter for other specified aftercare: Secondary | ICD-10-CM | POA: Diagnosis not present

## 2013-12-29 DIAGNOSIS — R262 Difficulty in walking, not elsewhere classified: Secondary | ICD-10-CM

## 2013-12-29 DIAGNOSIS — R2689 Other abnormalities of gait and mobility: Secondary | ICD-10-CM

## 2013-12-29 DIAGNOSIS — S129XXS Fracture of neck, unspecified, sequela: Secondary | ICD-10-CM
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
## 2014-01-01 ENCOUNTER — Ambulatory Visit (HOSPITAL_COMMUNITY)
Admission: RE | Admit: 2014-01-01 | Discharge: 2014-01-01 | Disposition: A | Discharge status: Home / Self Care | Payer: 59 | Source: Ambulatory Visit | Attending: Pulmonary Disease | Admitting: Pulmonary Disease

## 2014-01-01 ENCOUNTER — Encounter (HOSPITAL_COMMUNITY): Payer: Self-pay

## 2014-01-01 DIAGNOSIS — M6281 Muscle weakness (generalized): Secondary | ICD-10-CM

## 2014-01-01 DIAGNOSIS — M25512 Pain in left shoulder: Secondary | ICD-10-CM

## 2014-01-01 DIAGNOSIS — M7502 Adhesive capsulitis of left shoulder: Secondary | ICD-10-CM

## 2014-01-01 DIAGNOSIS — R2689 Other abnormalities of gait and mobility: Secondary | ICD-10-CM

## 2014-01-01 DIAGNOSIS — S14109D Unspecified injury at unspecified level of cervical spinal cord, subsequent encounter: Secondary | ICD-10-CM

## 2014-01-01 DIAGNOSIS — Z5189 Encounter for other specified aftercare: Secondary | ICD-10-CM | POA: Diagnosis not present

## 2014-01-01 DIAGNOSIS — S129XXD Fracture of neck, unspecified, subsequent encounter: Secondary | ICD-10-CM

## 2014-01-01 DIAGNOSIS — R262 Difficulty in walking, not elsewhere classified: Secondary | ICD-10-CM

## 2014-01-01 DIAGNOSIS — S129XXS Fracture of neck, unspecified, sequela: Secondary | ICD-10-CM
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
## 2014-01-01 NOTE — Patient Instructions (Signed)
Contrast Bath  Prepare the baths.  Cold = 55-65 degrees     Hot = 105-110 degrees  Starting with the hot, dip hand or foot all the way into the water and hold there for selected duration.  Preferably 3 minutes.  After selected duration is up, dip hand or foot into the cold for 1/3 duration of the hot. (3 minutes hot, 1 minute cold)  Alternate back and forth for the times indicated for no more than a total of 20 minutes ending with hot.   
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
## 2014-01-03 ENCOUNTER — Ambulatory Visit (HOSPITAL_COMMUNITY)
Admission: RE | Admit: 2014-01-03 | Discharge: 2014-01-03 | Disposition: A | Discharge status: Home / Self Care | Payer: 59 | Source: Ambulatory Visit | Attending: Pulmonary Disease | Admitting: Pulmonary Disease

## 2014-01-03 ENCOUNTER — Ambulatory Visit (HOSPITAL_COMMUNITY): Payer: 59 | Admitting: Specialist

## 2014-01-03 DIAGNOSIS — M6281 Muscle weakness (generalized): Secondary | ICD-10-CM

## 2014-01-03 DIAGNOSIS — R262 Difficulty in walking, not elsewhere classified: Secondary | ICD-10-CM

## 2014-01-03 DIAGNOSIS — R2689 Other abnormalities of gait and mobility: Secondary | ICD-10-CM

## 2014-01-03 DIAGNOSIS — Z5189 Encounter for other specified aftercare: Secondary | ICD-10-CM | POA: Diagnosis not present

## 2014-01-03 DIAGNOSIS — M25512 Pain in left shoulder: Secondary | ICD-10-CM

## 2014-01-03 DIAGNOSIS — M7502 Adhesive capsulitis of left shoulder: Secondary | ICD-10-CM

## 2014-01-03 NOTE — Addendum Note (Signed)
Encounter addended by: Jeanene ErbLaura D Persais Ethridge, OT on: 01/03/2014  8:47 AM<BR>     Documentation filed: Fast Note
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
## 2014-01-03 NOTE — Addendum Note (Signed)
Encounter addended by: Ezra SitesLeslie Eli Pattillo, Student-OT on: 01/03/2014  8:41 AM<BR>     Documentation filed: Clinical Notes, Inpatient Document Flowsheet

## 2014-01-03 NOTE — Addendum Note (Signed)
Encounter addended by: Jeanene ErbLaura D Haydan Mansouri, OT on: 01/03/2014  9:28 AM<BR>     Documentation filed: Clinical Notes

## 2014-01-03 NOTE — Patient Instructions (Signed)
Contrast Bath  Prepare the baths.  Cold = 55-65 degrees     Hot = 105-110 degrees  Starting with the hot, dip hand or foot all the way into the water and hold there for selected duration.  Preferably 3 minutes.  After selected duration is up, dip hand or foot into the cold for 1/3 duration of the hot. (3 minutes hot, 1 minute cold)  Alternate back and forth for the times indicated for no more than a total of 20 minutes ending with hot.   
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
## 2014-01-05 ENCOUNTER — Ambulatory Visit (HOSPITAL_COMMUNITY): Payer: 59 | Admitting: Physical Therapy

## 2014-01-05 ENCOUNTER — Ambulatory Visit (HOSPITAL_COMMUNITY)
Admission: RE | Admit: 2014-01-05 | Discharge: 2014-01-05 | Disposition: A | Discharge status: Home / Self Care | Payer: 59 | Source: Ambulatory Visit | Attending: Pulmonary Disease | Admitting: Pulmonary Disease

## 2014-01-05 ENCOUNTER — Encounter (HOSPITAL_COMMUNITY): Payer: Self-pay

## 2014-01-05 DIAGNOSIS — M6281 Muscle weakness (generalized): Secondary | ICD-10-CM

## 2014-01-05 DIAGNOSIS — R2689 Other abnormalities of gait and mobility: Secondary | ICD-10-CM

## 2014-01-05 DIAGNOSIS — R262 Difficulty in walking, not elsewhere classified: Secondary | ICD-10-CM

## 2014-01-05 DIAGNOSIS — M7502 Adhesive capsulitis of left shoulder: Secondary | ICD-10-CM

## 2014-01-05 DIAGNOSIS — S129XXS Fracture of neck, unspecified, sequela: Secondary | ICD-10-CM

## 2014-01-05 DIAGNOSIS — Z5189 Encounter for other specified aftercare: Secondary | ICD-10-CM | POA: Diagnosis not present

## 2014-01-05 DIAGNOSIS — S14109D Unspecified injury at unspecified level of cervical spinal cord, subsequent encounter: Secondary | ICD-10-CM

## 2014-01-05 DIAGNOSIS — M25512 Pain in left shoulder: Secondary | ICD-10-CM

## 2014-01-05 NOTE — Addendum Note (Signed)
Encounter addended by: Jeanene ErbLaura D Chasen Mendell, OT on: 01/05/2014  2:00 PM<BR>     Documentation filed: Fast Note
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
## 2014-01-05 NOTE — Patient Instructions (Addendum)
  AROM: Lateral Neck Flexion   Slowly tilt head toward one shoulder, then the other. Hold each position _5___ seconds. Repeat _10___ times per set. Do __1_ sets per session. Do _1-2___ sessions per day.  http://orth.exer.us/296   Copyright  [employer]. All rights reserved.  AROM: Neck Extension   Bend head backward. Hold __5__ seconds. Repeat _10___ times per set. Do __1__ sets per session. Do _1-2___ sessions per day.  http://orth.exer.us/300   Copyright  [employer]. All rights reserved.  AROM: Neck Flexion   Bend head forward. Hold __5__ seconds. Repeat _10___ times per set. Do _1___ sets per session. Do _1-2___ sessions per day.  http://orth.exer.us/298   Copyright  [employer]. All rights reserved.  AROM: Neck Rotation   Turn head slowly to look over one shoulder, then the other. Hold each position _5___ seconds. Repeat _10___ times per set. Do __1__ sets per session. Do _1-2___ sessions per day.  http://orth.exer.us/294   Copyright  [employer]. All rights reserved.

## 2014-01-05 NOTE — Addendum Note (Signed)
Encounter addended by: Ezra SitesLeslie Kathrin Folden, Student-OT on: 01/05/2014  1:58 PM<BR>     Documentation filed: Clinical Notes, Inpatient Document Flowsheet

## 2014-01-08 ENCOUNTER — Other Ambulatory Visit: Payer: Self-pay | Admitting: Neurosurgery

## 2014-01-08 ENCOUNTER — Ambulatory Visit (HOSPITAL_COMMUNITY)
Admission: RE | Admit: 2014-01-08 | Discharge: 2014-01-08 | Disposition: A | Discharge status: Home / Self Care | Payer: 59 | Source: Ambulatory Visit | Attending: Pulmonary Disease | Admitting: Pulmonary Disease

## 2014-01-08 ENCOUNTER — Ambulatory Visit (HOSPITAL_COMMUNITY): Payer: 59

## 2014-01-08 ENCOUNTER — Encounter (HOSPITAL_COMMUNITY): Payer: Self-pay

## 2014-01-08 DIAGNOSIS — M7502 Adhesive capsulitis of left shoulder: Secondary | ICD-10-CM

## 2014-01-08 DIAGNOSIS — S12001S Unspecified nondisplaced fracture of first cervical vertebra, sequela: Secondary | ICD-10-CM

## 2014-01-08 DIAGNOSIS — R262 Difficulty in walking, not elsewhere classified: Secondary | ICD-10-CM

## 2014-01-08 DIAGNOSIS — R2689 Other abnormalities of gait and mobility: Secondary | ICD-10-CM

## 2014-01-08 DIAGNOSIS — Z5189 Encounter for other specified aftercare: Secondary | ICD-10-CM | POA: Diagnosis not present

## 2014-01-08 DIAGNOSIS — M25512 Pain in left shoulder: Secondary | ICD-10-CM

## 2014-01-08 DIAGNOSIS — M6281 Muscle weakness (generalized): Secondary | ICD-10-CM
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
## 2014-01-08 NOTE — Addendum Note (Signed)
Encounter addended by: Jeanene ErbLaura D Mckenzi Buonomo, OT on: 01/08/2014  2:52 PM<BR>     Documentation filed: Clinical Notes, Inpatient Document Flowsheet
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
## 2014-01-08 NOTE — Patient Instructions (Signed)
Home Exercises Program Theraputty Exercises  Do the following exercises 2 times a day using your affected hand.  1. Roll putty into a ball.  2. Make into a pancake.  3. Roll putty into a roll.  4. Pinch along log with first finger and thumb.   5. Make into a ball.  6. Roll it back into a log.   7. Pinch using thumb and side of first finger.  8. Roll into a ball, then flatten into a pancake.  9. Using your fingers, make putty into a mountain.   

## 2014-01-10 ENCOUNTER — Ambulatory Visit (HOSPITAL_COMMUNITY)
Admission: RE | Admit: 2014-01-10 | Discharge: 2014-01-10 | Disposition: A | Discharge status: Home / Self Care | Payer: 59 | Source: Ambulatory Visit | Attending: Pulmonary Disease | Admitting: Pulmonary Disease

## 2014-01-10 ENCOUNTER — Ambulatory Visit (HOSPITAL_COMMUNITY): Payer: 59 | Admitting: Specialist

## 2014-01-10 ENCOUNTER — Encounter (HOSPITAL_COMMUNITY): Payer: Self-pay | Admitting: Physical Therapy

## 2014-01-10 ENCOUNTER — Encounter (HOSPITAL_COMMUNITY): Payer: Self-pay

## 2014-01-10 DIAGNOSIS — R2689 Other abnormalities of gait and mobility: Secondary | ICD-10-CM

## 2014-01-10 DIAGNOSIS — S129XXD Fracture of neck, unspecified, subsequent encounter: Secondary | ICD-10-CM

## 2014-01-10 DIAGNOSIS — M6281 Muscle weakness (generalized): Secondary | ICD-10-CM

## 2014-01-10 DIAGNOSIS — Z5189 Encounter for other specified aftercare: Secondary | ICD-10-CM | POA: Diagnosis not present

## 2014-01-10 DIAGNOSIS — M25512 Pain in left shoulder: Secondary | ICD-10-CM

## 2014-01-10 DIAGNOSIS — M7502 Adhesive capsulitis of left shoulder: Secondary | ICD-10-CM

## 2014-01-10 DIAGNOSIS — R262 Difficulty in walking, not elsewhere classified: Secondary | ICD-10-CM
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
## 2014-01-12 ENCOUNTER — Ambulatory Visit (HOSPITAL_COMMUNITY)
Admission: RE | Admit: 2014-01-12 | Discharge: 2014-01-12 | Disposition: A | Discharge status: Home / Self Care | Payer: 59 | Source: Ambulatory Visit | Attending: Pulmonary Disease | Admitting: Pulmonary Disease

## 2014-01-12 ENCOUNTER — Ambulatory Visit (HOSPITAL_COMMUNITY): Payer: 59 | Admitting: Physical Therapy

## 2014-01-12 ENCOUNTER — Encounter (HOSPITAL_COMMUNITY): Payer: Self-pay | Admitting: Physical Therapy

## 2014-01-12 DIAGNOSIS — R2689 Other abnormalities of gait and mobility: Secondary | ICD-10-CM

## 2014-01-12 DIAGNOSIS — M6281 Muscle weakness (generalized): Secondary | ICD-10-CM

## 2014-01-12 DIAGNOSIS — Z5189 Encounter for other specified aftercare: Secondary | ICD-10-CM | POA: Diagnosis not present

## 2014-01-12 DIAGNOSIS — R262 Difficulty in walking, not elsewhere classified: Secondary | ICD-10-CM

## 2014-01-12 DIAGNOSIS — S129XXD Fracture of neck, unspecified, subsequent encounter: Secondary | ICD-10-CM
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
## 2014-01-15 ENCOUNTER — Ambulatory Visit (HOSPITAL_COMMUNITY)
Admission: RE | Admit: 2014-01-15 | Discharge: 2014-01-15 | Disposition: A | Discharge status: Home / Self Care | Payer: 59 | Source: Ambulatory Visit | Attending: Pulmonary Disease | Admitting: Pulmonary Disease

## 2014-01-15 ENCOUNTER — Encounter (HOSPITAL_COMMUNITY): Payer: Self-pay

## 2014-01-15 ENCOUNTER — Ambulatory Visit (HOSPITAL_COMMUNITY): Payer: 59

## 2014-01-15 DIAGNOSIS — Z5189 Encounter for other specified aftercare: Secondary | ICD-10-CM | POA: Diagnosis not present

## 2014-01-15 DIAGNOSIS — R262 Difficulty in walking, not elsewhere classified: Secondary | ICD-10-CM

## 2014-01-15 DIAGNOSIS — S129XXD Fracture of neck, unspecified, subsequent encounter: Secondary | ICD-10-CM

## 2014-01-15 DIAGNOSIS — R2689 Other abnormalities of gait and mobility: Secondary | ICD-10-CM

## 2014-01-15 DIAGNOSIS — M6281 Muscle weakness (generalized): Secondary | ICD-10-CM

## 2014-01-15 DIAGNOSIS — M25512 Pain in left shoulder: Secondary | ICD-10-CM
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
## 2014-01-15 NOTE — Addendum Note (Signed)
Encounter addended by: Jeanene ErbLaura D Angla Delahunt, OT on: 01/15/2014  1:59 PM<BR>     Documentation filed: Clinical Notes, Flowsheet VN

## 2014-01-17 ENCOUNTER — Ambulatory Visit (HOSPITAL_COMMUNITY): Payer: 59 | Admitting: Specialist

## 2014-01-17 ENCOUNTER — Ambulatory Visit (HOSPITAL_COMMUNITY)
Admission: RE | Admit: 2014-01-17 | Discharge: 2014-01-17 | Disposition: A | Discharge status: Home / Self Care | Payer: 59 | Source: Ambulatory Visit | Attending: Pulmonary Disease | Admitting: Pulmonary Disease

## 2014-01-17 DIAGNOSIS — S14109D Unspecified injury at unspecified level of cervical spinal cord, subsequent encounter: Secondary | ICD-10-CM

## 2014-01-17 DIAGNOSIS — S129XXD Fracture of neck, unspecified, subsequent encounter: Secondary | ICD-10-CM

## 2014-01-17 DIAGNOSIS — Z5189 Encounter for other specified aftercare: Secondary | ICD-10-CM | POA: Diagnosis not present

## 2014-01-17 DIAGNOSIS — M6281 Muscle weakness (generalized): Secondary | ICD-10-CM

## 2014-01-17 DIAGNOSIS — R262 Difficulty in walking, not elsewhere classified: Secondary | ICD-10-CM

## 2014-01-17 DIAGNOSIS — S129XXS Fracture of neck, unspecified, sequela: Secondary | ICD-10-CM

## 2014-01-17 DIAGNOSIS — R2689 Other abnormalities of gait and mobility: Secondary | ICD-10-CM
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
## 2014-01-22 ENCOUNTER — Ambulatory Visit (HOSPITAL_COMMUNITY)
Admission: RE | Admit: 2014-01-22 | Discharge: 2014-01-22 | Disposition: A | Discharge status: Home / Self Care | Payer: 59 | Source: Ambulatory Visit | Attending: Pulmonary Disease | Admitting: Pulmonary Disease

## 2014-01-22 ENCOUNTER — Ambulatory Visit (HOSPITAL_COMMUNITY): Payer: 59 | Admitting: Physical Therapy

## 2014-01-22 ENCOUNTER — Encounter (HOSPITAL_COMMUNITY): Payer: Self-pay

## 2014-01-22 DIAGNOSIS — M6281 Muscle weakness (generalized): Secondary | ICD-10-CM

## 2014-01-22 DIAGNOSIS — S129XXD Fracture of neck, unspecified, subsequent encounter: Secondary | ICD-10-CM

## 2014-01-22 DIAGNOSIS — R2689 Other abnormalities of gait and mobility: Secondary | ICD-10-CM

## 2014-01-22 DIAGNOSIS — S14109D Unspecified injury at unspecified level of cervical spinal cord, subsequent encounter: Secondary | ICD-10-CM

## 2014-01-22 DIAGNOSIS — M7502 Adhesive capsulitis of left shoulder: Secondary | ICD-10-CM

## 2014-01-22 DIAGNOSIS — M25512 Pain in left shoulder: Secondary | ICD-10-CM

## 2014-01-22 DIAGNOSIS — S129XXS Fracture of neck, unspecified, sequela: Secondary | ICD-10-CM

## 2014-01-22 DIAGNOSIS — Z5189 Encounter for other specified aftercare: Secondary | ICD-10-CM | POA: Diagnosis not present

## 2014-01-22 DIAGNOSIS — R262 Difficulty in walking, not elsewhere classified: Secondary | ICD-10-CM
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
## 2014-01-25 ENCOUNTER — Ambulatory Visit (HOSPITAL_COMMUNITY)
Admission: RE | Admit: 2014-01-25 | Discharge: 2014-01-25 | Disposition: A | Discharge status: Home / Self Care | Payer: 59 | Source: Ambulatory Visit | Attending: Pulmonary Disease | Admitting: Pulmonary Disease

## 2014-01-25 ENCOUNTER — Ambulatory Visit (HOSPITAL_COMMUNITY): Payer: 59

## 2014-01-25 ENCOUNTER — Encounter (HOSPITAL_COMMUNITY): Payer: Self-pay

## 2014-01-25 DIAGNOSIS — M7502 Adhesive capsulitis of left shoulder: Secondary | ICD-10-CM | POA: Insufficient documentation

## 2014-01-25 DIAGNOSIS — R2689 Other abnormalities of gait and mobility: Secondary | ICD-10-CM

## 2014-01-25 DIAGNOSIS — Z5189 Encounter for other specified aftercare: Secondary | ICD-10-CM | POA: Diagnosis present

## 2014-01-25 DIAGNOSIS — M25512 Pain in left shoulder: Secondary | ICD-10-CM

## 2014-01-25 DIAGNOSIS — R262 Difficulty in walking, not elsewhere classified: Secondary | ICD-10-CM

## 2014-01-25 DIAGNOSIS — M6281 Muscle weakness (generalized): Secondary | ICD-10-CM | POA: Insufficient documentation
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
## 2014-01-29 ENCOUNTER — Ambulatory Visit (HOSPITAL_COMMUNITY)
Admission: RE | Admit: 2014-01-29 | Discharge: 2014-01-29 | Disposition: A | Discharge status: Home / Self Care | Payer: 59 | Source: Ambulatory Visit | Attending: Pulmonary Disease | Admitting: Pulmonary Disease

## 2014-01-29 ENCOUNTER — Encounter (HOSPITAL_COMMUNITY): Payer: Self-pay

## 2014-01-29 ENCOUNTER — Ambulatory Visit (HOSPITAL_COMMUNITY): Payer: 59 | Admitting: Physical Therapy

## 2014-01-29 DIAGNOSIS — Z5189 Encounter for other specified aftercare: Secondary | ICD-10-CM | POA: Diagnosis not present

## 2014-01-29 DIAGNOSIS — M7502 Adhesive capsulitis of left shoulder: Secondary | ICD-10-CM

## 2014-01-29 DIAGNOSIS — M6281 Muscle weakness (generalized): Secondary | ICD-10-CM

## 2014-01-29 DIAGNOSIS — R2689 Other abnormalities of gait and mobility: Secondary | ICD-10-CM

## 2014-01-29 DIAGNOSIS — M25512 Pain in left shoulder: Secondary | ICD-10-CM

## 2014-01-29 DIAGNOSIS — R262 Difficulty in walking, not elsewhere classified: Secondary | ICD-10-CM

## 2014-01-29 DIAGNOSIS — S129XXD Fracture of neck, unspecified, subsequent encounter: Secondary | ICD-10-CM
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
## 2014-01-30 ENCOUNTER — Other Ambulatory Visit: Payer: Self-pay | Admitting: Neurosurgery

## 2014-01-30 ENCOUNTER — Other Ambulatory Visit: Payer: 59

## 2014-01-30 ENCOUNTER — Ambulatory Visit
Admission: RE | Admit: 2014-01-30 | Discharge: 2014-01-30 | Disposition: A | Discharge status: Home / Self Care | Payer: 59 | Source: Ambulatory Visit | Attending: Neurosurgery | Admitting: Neurosurgery

## 2014-01-30 DIAGNOSIS — S12001S Unspecified nondisplaced fracture of first cervical vertebra, sequela: Secondary | ICD-10-CM

## 2014-01-31 ENCOUNTER — Encounter: Payer: Self-pay | Admitting: Physical Medicine & Rehabilitation

## 2014-01-31 ENCOUNTER — Encounter: Payer: 59 | Attending: Physical Medicine & Rehabilitation | Admitting: Physical Medicine & Rehabilitation

## 2014-01-31 VITALS — BP 164/75 | HR 77 | Resp 14 | Ht 70.0 in | Wt 211.0 lb

## 2014-01-31 DIAGNOSIS — S12200S Unspecified displaced fracture of third cervical vertebra, sequela: Secondary | ICD-10-CM | POA: Insufficient documentation

## 2014-01-31 DIAGNOSIS — S12090S Other displaced fracture of first cervical vertebra, sequela: Secondary | ICD-10-CM | POA: Diagnosis not present

## 2014-01-31 DIAGNOSIS — N319 Neuromuscular dysfunction of bladder, unspecified: Secondary | ICD-10-CM | POA: Diagnosis not present

## 2014-01-31 DIAGNOSIS — G8250 Quadriplegia, unspecified: Secondary | ICD-10-CM

## 2014-01-31 DIAGNOSIS — M25512 Pain in left shoulder: Secondary | ICD-10-CM | POA: Insufficient documentation

## 2014-01-31 DIAGNOSIS — M7502 Adhesive capsulitis of left shoulder: Secondary | ICD-10-CM | POA: Insufficient documentation

## 2014-01-31 DIAGNOSIS — K592 Neurogenic bowel, not elsewhere classified: Secondary | ICD-10-CM | POA: Insufficient documentation

## 2014-01-31 DIAGNOSIS — E1140 Type 2 diabetes mellitus with diabetic neuropathy, unspecified: Secondary | ICD-10-CM | POA: Diagnosis not present

## 2014-01-31 DIAGNOSIS — S14159S Other incomplete lesion at unspecified level of cervical spinal cord, sequela: Secondary | ICD-10-CM | POA: Diagnosis present

## 2014-01-31 DIAGNOSIS — S14109S Unspecified injury at unspecified level of cervical spinal cord, sequela: Secondary | ICD-10-CM

## 2014-01-31 DIAGNOSIS — S129XXS Fracture of neck, unspecified, sequela: Secondary | ICD-10-CM

## 2014-01-31 MED ORDER — BACLOFEN 10 MG PO TABS: 10.0000 mg | ORAL_TABLET | Freq: Every evening | ORAL | Status: DC | PRN

## 2014-01-31 MED ORDER — NAPROXEN 500 MG PO TABS: ORAL_TABLET | ORAL | Status: DC
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
## 2014-01-31 NOTE — Patient Instructions (Signed)
SCALP WOUND: GENTLE SCRUB TO YOUR HEAD WITH A RAG, RINSE WITH SOAP AND WATER IN SHOWER, THEN DRY----NO OINTMENTS  KEEP YOUR LEG AND ARM ELEVATED WHEN YOU CAN

## 2014-02-01 ENCOUNTER — Encounter (HOSPITAL_COMMUNITY): Payer: Self-pay

## 2014-02-01 ENCOUNTER — Ambulatory Visit (HOSPITAL_COMMUNITY): Payer: 59

## 2014-02-01 ENCOUNTER — Ambulatory Visit (HOSPITAL_COMMUNITY)
Admission: RE | Admit: 2014-02-01 | Discharge: 2014-02-01 | Disposition: A | Discharge status: Home / Self Care | Payer: 59 | Source: Ambulatory Visit | Attending: Pulmonary Disease | Admitting: Pulmonary Disease

## 2014-02-01 DIAGNOSIS — S129XXD Fracture of neck, unspecified, subsequent encounter: Secondary | ICD-10-CM

## 2014-02-01 DIAGNOSIS — M6281 Muscle weakness (generalized): Secondary | ICD-10-CM

## 2014-02-01 DIAGNOSIS — R2689 Other abnormalities of gait and mobility: Secondary | ICD-10-CM

## 2014-02-01 DIAGNOSIS — S14109D Unspecified injury at unspecified level of cervical spinal cord, subsequent encounter: Secondary | ICD-10-CM

## 2014-02-01 DIAGNOSIS — R262 Difficulty in walking, not elsewhere classified: Secondary | ICD-10-CM

## 2014-02-01 DIAGNOSIS — S129XXS Fracture of neck, unspecified, sequela: Secondary | ICD-10-CM

## 2014-02-01 DIAGNOSIS — M7502 Adhesive capsulitis of left shoulder: Secondary | ICD-10-CM

## 2014-02-01 DIAGNOSIS — Z5189 Encounter for other specified aftercare: Secondary | ICD-10-CM | POA: Diagnosis not present

## 2014-02-01 DIAGNOSIS — M25512 Pain in left shoulder: Secondary | ICD-10-CM

## 2014-02-01 NOTE — Addendum Note (Signed)
Encounter addended by: Jeanene ErbLaura D Essenmacher, OT on: 02/01/2014  1:31 PM<BR>     Documentation filed: Clinical Notes

## 2014-02-01 NOTE — Patient Instructions (Signed)
SHOULDER: Flexion On Table   Place hands on table, elbows straight. Move hips away from body. Press hands down into table. Hold _5__ seconds. __10_ reps per set, __2_ sets per day, ___7 days per week  Abduction (Passive)   With arm out to side, resting on table, lower head toward arm, keeping trunk away from table. Hold __5__ seconds. Repeat 10____ times. Do _2___ sessions per day.  Copyright  [employer]. All rights reserved.     Internal Rotation (Assistive)   Seated with elbow bent at right angle and held against side, slide arm on table surface in an inward arc. Repeat _10___ times. Do __2__ sessions per day. Activity: Use this motion to brush crumbs off the table.  Copyright  V[employer]. All rights reserved.    
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
## 2014-02-05 ENCOUNTER — Encounter (HOSPITAL_COMMUNITY): Payer: Self-pay

## 2014-02-05 ENCOUNTER — Ambulatory Visit (HOSPITAL_COMMUNITY)
Admission: RE | Admit: 2014-02-05 | Discharge: 2014-02-05 | Disposition: A | Discharge status: Home / Self Care | Payer: 59 | Source: Ambulatory Visit | Attending: Pulmonary Disease | Admitting: Pulmonary Disease

## 2014-02-05 ENCOUNTER — Ambulatory Visit (HOSPITAL_COMMUNITY): Payer: 59

## 2014-02-05 DIAGNOSIS — M6281 Muscle weakness (generalized): Secondary | ICD-10-CM

## 2014-02-05 DIAGNOSIS — M7502 Adhesive capsulitis of left shoulder: Secondary | ICD-10-CM

## 2014-02-05 DIAGNOSIS — Z5189 Encounter for other specified aftercare: Secondary | ICD-10-CM | POA: Diagnosis not present

## 2014-02-05 DIAGNOSIS — S129XXD Fracture of neck, unspecified, subsequent encounter: Secondary | ICD-10-CM

## 2014-02-05 DIAGNOSIS — M25512 Pain in left shoulder: Secondary | ICD-10-CM

## 2014-02-05 DIAGNOSIS — R2689 Other abnormalities of gait and mobility: Secondary | ICD-10-CM

## 2014-02-05 DIAGNOSIS — S14109D Unspecified injury at unspecified level of cervical spinal cord, subsequent encounter: Secondary | ICD-10-CM

## 2014-02-05 DIAGNOSIS — R262 Difficulty in walking, not elsewhere classified: Secondary | ICD-10-CM
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
## 2014-02-08 ENCOUNTER — Ambulatory Visit (HOSPITAL_COMMUNITY)
Admission: RE | Admit: 2014-02-08 | Discharge: 2014-02-08 | Disposition: A | Discharge status: Home / Self Care | Payer: 59 | Source: Ambulatory Visit | Attending: Pulmonary Disease | Admitting: Pulmonary Disease

## 2014-02-08 ENCOUNTER — Ambulatory Visit (HOSPITAL_COMMUNITY): Payer: 59

## 2014-02-08 ENCOUNTER — Encounter (HOSPITAL_COMMUNITY): Payer: Self-pay

## 2014-02-08 DIAGNOSIS — S14109D Unspecified injury at unspecified level of cervical spinal cord, subsequent encounter: Secondary | ICD-10-CM

## 2014-02-08 DIAGNOSIS — R2689 Other abnormalities of gait and mobility: Secondary | ICD-10-CM

## 2014-02-08 DIAGNOSIS — M6281 Muscle weakness (generalized): Secondary | ICD-10-CM

## 2014-02-08 DIAGNOSIS — M25512 Pain in left shoulder: Secondary | ICD-10-CM

## 2014-02-08 DIAGNOSIS — Z5189 Encounter for other specified aftercare: Secondary | ICD-10-CM | POA: Diagnosis not present

## 2014-02-08 DIAGNOSIS — R262 Difficulty in walking, not elsewhere classified: Secondary | ICD-10-CM

## 2014-02-08 DIAGNOSIS — S129XXD Fracture of neck, unspecified, subsequent encounter: Secondary | ICD-10-CM
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
## 2014-02-12 ENCOUNTER — Ambulatory Visit (HOSPITAL_COMMUNITY)
Admission: RE | Admit: 2014-02-12 | Discharge: 2014-02-12 | Disposition: A | Discharge status: Home / Self Care | Payer: 59 | Source: Ambulatory Visit | Attending: Pulmonary Disease | Admitting: Pulmonary Disease

## 2014-02-12 DIAGNOSIS — Z5189 Encounter for other specified aftercare: Secondary | ICD-10-CM | POA: Diagnosis not present

## 2014-02-12 DIAGNOSIS — M25512 Pain in left shoulder: Secondary | ICD-10-CM
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
## 2014-02-13 ENCOUNTER — Ambulatory Visit (HOSPITAL_COMMUNITY): Payer: 59

## 2014-02-13 ENCOUNTER — Ambulatory Visit (HOSPITAL_COMMUNITY)
Admission: RE | Admit: 2014-02-13 | Discharge: 2014-02-13 | Disposition: A | Discharge status: Home / Self Care | Payer: 59 | Source: Ambulatory Visit | Attending: Pulmonary Disease | Admitting: Pulmonary Disease

## 2014-02-13 DIAGNOSIS — R262 Difficulty in walking, not elsewhere classified: Secondary | ICD-10-CM

## 2014-02-13 DIAGNOSIS — Z5189 Encounter for other specified aftercare: Secondary | ICD-10-CM | POA: Diagnosis not present

## 2014-02-13 DIAGNOSIS — S129XXD Fracture of neck, unspecified, subsequent encounter: Secondary | ICD-10-CM

## 2014-02-13 DIAGNOSIS — R2689 Other abnormalities of gait and mobility: Secondary | ICD-10-CM

## 2014-02-13 DIAGNOSIS — M6281 Muscle weakness (generalized): Secondary | ICD-10-CM

## 2014-02-13 NOTE — Patient Instructions (Signed)
Flexion: Stretch - Quadriceps (Prone)   Position Helper: Place one hand on shin near left ankle. Stabilize buttock to keep hip flat on bed.  Hold 20 seconds. Repeat 4 times. Repeat with other leg. Do 2 sessions per day.
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
## 2014-02-15 ENCOUNTER — Encounter (HOSPITAL_COMMUNITY): Payer: Self-pay

## 2014-02-15 ENCOUNTER — Ambulatory Visit (HOSPITAL_COMMUNITY): Payer: 59 | Admitting: Physical Therapy

## 2014-02-15 ENCOUNTER — Ambulatory Visit (HOSPITAL_COMMUNITY)
Admission: RE | Admit: 2014-02-15 | Discharge: 2014-02-15 | Disposition: A | Discharge status: Home / Self Care | Payer: 59 | Source: Ambulatory Visit | Attending: Pulmonary Disease | Admitting: Pulmonary Disease

## 2014-02-15 DIAGNOSIS — Z5189 Encounter for other specified aftercare: Secondary | ICD-10-CM | POA: Diagnosis not present

## 2014-02-15 DIAGNOSIS — M6281 Muscle weakness (generalized): Secondary | ICD-10-CM

## 2014-02-15 DIAGNOSIS — M25512 Pain in left shoulder: Secondary | ICD-10-CM

## 2014-02-15 DIAGNOSIS — M7502 Adhesive capsulitis of left shoulder: Secondary | ICD-10-CM

## 2014-02-15 DIAGNOSIS — R2689 Other abnormalities of gait and mobility: Secondary | ICD-10-CM

## 2014-02-15 DIAGNOSIS — R262 Difficulty in walking, not elsewhere classified: Secondary | ICD-10-CM
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
## 2014-02-19 ENCOUNTER — Encounter (HOSPITAL_COMMUNITY): Payer: Self-pay

## 2014-02-19 ENCOUNTER — Ambulatory Visit (HOSPITAL_COMMUNITY): Payer: 59

## 2014-02-19 ENCOUNTER — Ambulatory Visit (HOSPITAL_COMMUNITY)
Admission: RE | Admit: 2014-02-19 | Discharge: 2014-02-19 | Disposition: A | Discharge status: Home / Self Care | Payer: 59 | Source: Ambulatory Visit | Attending: Pulmonary Disease | Admitting: Pulmonary Disease

## 2014-02-19 DIAGNOSIS — M25512 Pain in left shoulder: Secondary | ICD-10-CM

## 2014-02-19 DIAGNOSIS — R262 Difficulty in walking, not elsewhere classified: Secondary | ICD-10-CM

## 2014-02-19 DIAGNOSIS — M6281 Muscle weakness (generalized): Secondary | ICD-10-CM

## 2014-02-19 DIAGNOSIS — R2689 Other abnormalities of gait and mobility: Secondary | ICD-10-CM

## 2014-02-19 DIAGNOSIS — Z5189 Encounter for other specified aftercare: Secondary | ICD-10-CM | POA: Diagnosis not present

## 2014-02-19 DIAGNOSIS — M7502 Adhesive capsulitis of left shoulder: Secondary | ICD-10-CM
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
## 2014-02-22 ENCOUNTER — Encounter (HOSPITAL_COMMUNITY): Payer: Self-pay

## 2014-02-22 ENCOUNTER — Ambulatory Visit (HOSPITAL_COMMUNITY)
Admission: RE | Admit: 2014-02-22 | Discharge: 2014-02-22 | Disposition: A | Discharge status: Home / Self Care | Payer: 59 | Source: Ambulatory Visit | Attending: Pulmonary Disease | Admitting: Pulmonary Disease

## 2014-02-22 ENCOUNTER — Ambulatory Visit (HOSPITAL_COMMUNITY): Payer: 59

## 2014-02-22 DIAGNOSIS — Z5189 Encounter for other specified aftercare: Secondary | ICD-10-CM | POA: Diagnosis not present

## 2014-02-22 DIAGNOSIS — S14109D Unspecified injury at unspecified level of cervical spinal cord, subsequent encounter: Secondary | ICD-10-CM

## 2014-02-22 DIAGNOSIS — S129XXD Fracture of neck, unspecified, subsequent encounter: Secondary | ICD-10-CM

## 2014-02-22 DIAGNOSIS — M6281 Muscle weakness (generalized): Secondary | ICD-10-CM

## 2014-02-22 DIAGNOSIS — R2689 Other abnormalities of gait and mobility: Secondary | ICD-10-CM

## 2014-02-22 DIAGNOSIS — R262 Difficulty in walking, not elsewhere classified: Secondary | ICD-10-CM

## 2014-02-22 DIAGNOSIS — M7502 Adhesive capsulitis of left shoulder: Secondary | ICD-10-CM

## 2014-02-22 DIAGNOSIS — M25512 Pain in left shoulder: Secondary | ICD-10-CM

## 2014-02-22 NOTE — Patient Instructions (Signed)
Your Splint This splint should initially be fitted by a healthcare practitioner.  The healthcare practitioner is responsible for providing wearing instructions and precautions to the patient, other healthcare practitioners and care provider involved in the patient's care.  This splint was custom made for you. Please read the following instructions to learn about wearing and caring for your splint.  Precautions Should your splint cause any of the following problems, remove the splint immediately and contact your therapist/physician.  Swelling  Severe Pain  Pressure Areas  Stiffness  Numbness  Do not wear your splint while operating machinery unless it has been fabricated for that purpose.  When To Wear Your Splint Where your splint according to your therapist/physician instructions. All the time  Care and Cleaning of Your Splint 1. Keep your splint away from open flames. 2. Your splint will lose its shape in temperatures over 135 degrees Farenheit, ( in car windows, near radiators, ovens or in hot water).  Never make any adjustments to your splint, if the splint needs adjusting remove it and make an appointment to see your therapist. 3. Your splint, including the cushion liner may be cleaned with soap and lukewarm water.  Do not immerse in hot water over 135 degrees Farenheit. 4. Straps may be washed with soap and water, but do not moisten the self-adhesive portion. 5. For ink or hard to remove spots use a scouring cleanser which contains chlorine.  Rinse the splint thoroughly after using chlorine cleanser. 6.
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
## 2014-02-27 ENCOUNTER — Ambulatory Visit (HOSPITAL_COMMUNITY)
Admission: RE | Admit: 2014-02-27 | Discharge: 2014-02-27 | Disposition: A | Discharge status: Home / Self Care | Payer: 59 | Source: Ambulatory Visit | Attending: Pulmonary Disease | Admitting: Pulmonary Disease

## 2014-02-27 ENCOUNTER — Encounter (HOSPITAL_COMMUNITY): Payer: Self-pay

## 2014-02-27 DIAGNOSIS — Z5189 Encounter for other specified aftercare: Secondary | ICD-10-CM | POA: Diagnosis not present

## 2014-02-27 DIAGNOSIS — R262 Difficulty in walking, not elsewhere classified: Secondary | ICD-10-CM

## 2014-02-27 DIAGNOSIS — M7502 Adhesive capsulitis of left shoulder: Secondary | ICD-10-CM

## 2014-02-27 DIAGNOSIS — M6281 Muscle weakness (generalized): Secondary | ICD-10-CM

## 2014-02-27 DIAGNOSIS — R2689 Other abnormalities of gait and mobility: Secondary | ICD-10-CM

## 2014-02-27 DIAGNOSIS — S14109D Unspecified injury at unspecified level of cervical spinal cord, subsequent encounter: Secondary | ICD-10-CM

## 2014-02-27 DIAGNOSIS — M25512 Pain in left shoulder: Secondary | ICD-10-CM
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
## 2014-03-01 ENCOUNTER — Ambulatory Visit (HOSPITAL_COMMUNITY)
Admission: RE | Admit: 2014-03-01 | Discharge: 2014-03-01 | Disposition: A | Discharge status: Home / Self Care | Payer: 59 | Source: Ambulatory Visit | Attending: Pulmonary Disease | Admitting: Pulmonary Disease

## 2014-03-01 ENCOUNTER — Encounter (HOSPITAL_COMMUNITY): Payer: Self-pay

## 2014-03-01 DIAGNOSIS — M7502 Adhesive capsulitis of left shoulder: Secondary | ICD-10-CM

## 2014-03-01 DIAGNOSIS — S129XXS Fracture of neck, unspecified, sequela: Secondary | ICD-10-CM

## 2014-03-01 DIAGNOSIS — R262 Difficulty in walking, not elsewhere classified: Secondary | ICD-10-CM

## 2014-03-01 DIAGNOSIS — Z5189 Encounter for other specified aftercare: Secondary | ICD-10-CM | POA: Diagnosis not present

## 2014-03-01 DIAGNOSIS — S14109D Unspecified injury at unspecified level of cervical spinal cord, subsequent encounter: Secondary | ICD-10-CM

## 2014-03-01 DIAGNOSIS — R2689 Other abnormalities of gait and mobility: Secondary | ICD-10-CM

## 2014-03-01 DIAGNOSIS — M6281 Muscle weakness (generalized): Secondary | ICD-10-CM

## 2014-03-01 DIAGNOSIS — T07XXXA Unspecified multiple injuries, initial encounter: Secondary | ICD-10-CM

## 2014-03-01 DIAGNOSIS — S129XXD Fracture of neck, unspecified, subsequent encounter: Secondary | ICD-10-CM

## 2014-03-01 DIAGNOSIS — F05 Delirium due to known physiological condition: Secondary | ICD-10-CM

## 2014-03-01 DIAGNOSIS — M25512 Pain in left shoulder: Secondary | ICD-10-CM
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
## 2014-03-06 ENCOUNTER — Ambulatory Visit (HOSPITAL_COMMUNITY)
Admission: RE | Admit: 2014-03-06 | Discharge: 2014-03-06 | Disposition: A | Discharge status: Home / Self Care | Payer: 59 | Source: Ambulatory Visit | Attending: Pulmonary Disease | Admitting: Pulmonary Disease

## 2014-03-06 ENCOUNTER — Encounter (HOSPITAL_COMMUNITY): Payer: Self-pay

## 2014-03-06 DIAGNOSIS — S129XXS Fracture of neck, unspecified, sequela: Secondary | ICD-10-CM

## 2014-03-06 DIAGNOSIS — M25512 Pain in left shoulder: Secondary | ICD-10-CM

## 2014-03-06 DIAGNOSIS — M7502 Adhesive capsulitis of left shoulder: Secondary | ICD-10-CM

## 2014-03-06 DIAGNOSIS — S14109D Unspecified injury at unspecified level of cervical spinal cord, subsequent encounter: Secondary | ICD-10-CM

## 2014-03-06 DIAGNOSIS — M6281 Muscle weakness (generalized): Secondary | ICD-10-CM

## 2014-03-06 DIAGNOSIS — R262 Difficulty in walking, not elsewhere classified: Secondary | ICD-10-CM

## 2014-03-06 DIAGNOSIS — Z5189 Encounter for other specified aftercare: Secondary | ICD-10-CM | POA: Diagnosis not present

## 2014-03-06 DIAGNOSIS — S129XXD Fracture of neck, unspecified, subsequent encounter: Secondary | ICD-10-CM

## 2014-03-06 DIAGNOSIS — R2689 Other abnormalities of gait and mobility: Secondary | ICD-10-CM
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
## 2014-03-08 ENCOUNTER — Ambulatory Visit (HOSPITAL_COMMUNITY)
Admission: RE | Admit: 2014-03-08 | Discharge: 2014-03-08 | Disposition: A | Discharge status: Home / Self Care | Payer: 59 | Source: Ambulatory Visit | Attending: Pulmonary Disease | Admitting: Pulmonary Disease

## 2014-03-08 ENCOUNTER — Encounter (HOSPITAL_COMMUNITY): Payer: Self-pay

## 2014-03-08 DIAGNOSIS — M6281 Muscle weakness (generalized): Secondary | ICD-10-CM

## 2014-03-08 DIAGNOSIS — Z5189 Encounter for other specified aftercare: Secondary | ICD-10-CM | POA: Diagnosis not present

## 2014-03-08 DIAGNOSIS — M25512 Pain in left shoulder: Secondary | ICD-10-CM

## 2014-03-08 DIAGNOSIS — M7502 Adhesive capsulitis of left shoulder: Secondary | ICD-10-CM

## 2014-03-08 DIAGNOSIS — S14109D Unspecified injury at unspecified level of cervical spinal cord, subsequent encounter: Secondary | ICD-10-CM

## 2014-03-08 DIAGNOSIS — R2689 Other abnormalities of gait and mobility: Secondary | ICD-10-CM

## 2014-03-08 DIAGNOSIS — R262 Difficulty in walking, not elsewhere classified: Secondary | ICD-10-CM
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
## 2014-03-13 ENCOUNTER — Ambulatory Visit (HOSPITAL_COMMUNITY)
Admission: RE | Admit: 2014-03-13 | Discharge: 2014-03-13 | Disposition: A | Discharge status: Home / Self Care | Payer: 59 | Source: Ambulatory Visit | Attending: Pulmonary Disease | Admitting: Pulmonary Disease

## 2014-03-13 DIAGNOSIS — S14109D Unspecified injury at unspecified level of cervical spinal cord, subsequent encounter: Secondary | ICD-10-CM

## 2014-03-13 DIAGNOSIS — M25512 Pain in left shoulder: Secondary | ICD-10-CM

## 2014-03-13 DIAGNOSIS — M6281 Muscle weakness (generalized): Secondary | ICD-10-CM

## 2014-03-13 DIAGNOSIS — M7502 Adhesive capsulitis of left shoulder: Secondary | ICD-10-CM

## 2014-03-13 DIAGNOSIS — Z5189 Encounter for other specified aftercare: Secondary | ICD-10-CM | POA: Diagnosis not present

## 2014-03-13 DIAGNOSIS — R262 Difficulty in walking, not elsewhere classified: Secondary | ICD-10-CM

## 2014-03-13 DIAGNOSIS — R2689 Other abnormalities of gait and mobility: Secondary | ICD-10-CM
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
## 2014-03-15 ENCOUNTER — Ambulatory Visit (HOSPITAL_COMMUNITY): Payer: 59 | Admitting: Physical Therapy

## 2014-03-15 ENCOUNTER — Ambulatory Visit (HOSPITAL_COMMUNITY): Payer: 59

## 2014-03-19 ENCOUNTER — Ambulatory Visit (HOSPITAL_COMMUNITY)
Admission: RE | Admit: 2014-03-19 | Discharge: 2014-03-19 | Disposition: A | Discharge status: Home / Self Care | Payer: 59 | Source: Ambulatory Visit | Attending: Pulmonary Disease | Admitting: Pulmonary Disease

## 2014-03-19 ENCOUNTER — Encounter (HOSPITAL_COMMUNITY): Payer: Self-pay

## 2014-03-19 DIAGNOSIS — Z5189 Encounter for other specified aftercare: Secondary | ICD-10-CM | POA: Diagnosis not present

## 2014-03-19 DIAGNOSIS — S14109D Unspecified injury at unspecified level of cervical spinal cord, subsequent encounter: Secondary | ICD-10-CM

## 2014-03-19 DIAGNOSIS — M6281 Muscle weakness (generalized): Secondary | ICD-10-CM

## 2014-03-19 DIAGNOSIS — R262 Difficulty in walking, not elsewhere classified: Secondary | ICD-10-CM

## 2014-03-19 DIAGNOSIS — M7502 Adhesive capsulitis of left shoulder: Secondary | ICD-10-CM

## 2014-03-19 DIAGNOSIS — M25512 Pain in left shoulder: Secondary | ICD-10-CM

## 2014-03-19 DIAGNOSIS — R2689 Other abnormalities of gait and mobility: Secondary | ICD-10-CM
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
## 2014-03-21 ENCOUNTER — Encounter (HOSPITAL_COMMUNITY): Payer: 59 | Admitting: Physical Therapy

## 2014-03-21 ENCOUNTER — Encounter (HOSPITAL_COMMUNITY): Payer: 59

## 2014-03-22 ENCOUNTER — Ambulatory Visit (HOSPITAL_COMMUNITY)
Admission: RE | Admit: 2014-03-22 | Discharge: 2014-03-22 | Disposition: A | Discharge status: Home / Self Care | Payer: 59 | Source: Ambulatory Visit | Attending: Pulmonary Disease | Admitting: Pulmonary Disease

## 2014-03-22 ENCOUNTER — Encounter (HOSPITAL_COMMUNITY): Payer: Self-pay

## 2014-03-22 DIAGNOSIS — M7502 Adhesive capsulitis of left shoulder: Secondary | ICD-10-CM

## 2014-03-22 DIAGNOSIS — Z5189 Encounter for other specified aftercare: Secondary | ICD-10-CM | POA: Diagnosis not present

## 2014-03-22 DIAGNOSIS — M25512 Pain in left shoulder: Secondary | ICD-10-CM

## 2014-03-22 DIAGNOSIS — M6281 Muscle weakness (generalized): Secondary | ICD-10-CM

## 2014-03-22 DIAGNOSIS — S14109D Unspecified injury at unspecified level of cervical spinal cord, subsequent encounter: Secondary | ICD-10-CM
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
## 2014-03-26 ENCOUNTER — Ambulatory Visit (HOSPITAL_COMMUNITY)
Admission: RE | Admit: 2014-03-26 | Discharge: 2014-03-26 | Disposition: A | Discharge status: Home / Self Care | Payer: 59 | Source: Ambulatory Visit | Attending: Pulmonary Disease | Admitting: Pulmonary Disease

## 2014-03-26 DIAGNOSIS — S129XXD Fracture of neck, unspecified, subsequent encounter: Secondary | ICD-10-CM

## 2014-03-26 DIAGNOSIS — M6281 Muscle weakness (generalized): Secondary | ICD-10-CM

## 2014-03-26 DIAGNOSIS — M7502 Adhesive capsulitis of left shoulder: Secondary | ICD-10-CM | POA: Diagnosis not present

## 2014-03-26 DIAGNOSIS — R2689 Other abnormalities of gait and mobility: Secondary | ICD-10-CM

## 2014-03-26 DIAGNOSIS — M25512 Pain in left shoulder: Secondary | ICD-10-CM

## 2014-03-26 DIAGNOSIS — Z5189 Encounter for other specified aftercare: Secondary | ICD-10-CM | POA: Insufficient documentation

## 2014-03-26 DIAGNOSIS — R262 Difficulty in walking, not elsewhere classified: Secondary | ICD-10-CM
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
## 2014-03-29 ENCOUNTER — Ambulatory Visit (HOSPITAL_COMMUNITY)
Admission: RE | Admit: 2014-03-29 | Discharge: 2014-03-29 | Disposition: A | Discharge status: Home / Self Care | Payer: 59 | Source: Ambulatory Visit | Attending: Pulmonary Disease | Admitting: Pulmonary Disease

## 2014-03-29 ENCOUNTER — Encounter (HOSPITAL_COMMUNITY): Payer: Self-pay

## 2014-03-29 DIAGNOSIS — M6281 Muscle weakness (generalized): Secondary | ICD-10-CM

## 2014-03-29 DIAGNOSIS — S129XXS Fracture of neck, unspecified, sequela: Secondary | ICD-10-CM

## 2014-03-29 DIAGNOSIS — S14109D Unspecified injury at unspecified level of cervical spinal cord, subsequent encounter: Secondary | ICD-10-CM

## 2014-03-29 DIAGNOSIS — R262 Difficulty in walking, not elsewhere classified: Secondary | ICD-10-CM

## 2014-03-29 DIAGNOSIS — S129XXD Fracture of neck, unspecified, subsequent encounter: Secondary | ICD-10-CM

## 2014-03-29 DIAGNOSIS — M7502 Adhesive capsulitis of left shoulder: Secondary | ICD-10-CM

## 2014-03-29 DIAGNOSIS — Z5189 Encounter for other specified aftercare: Secondary | ICD-10-CM | POA: Diagnosis not present

## 2014-03-29 DIAGNOSIS — R2689 Other abnormalities of gait and mobility: Secondary | ICD-10-CM
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
## 2014-04-02 ENCOUNTER — Encounter (HOSPITAL_COMMUNITY): Payer: Self-pay

## 2014-04-02 ENCOUNTER — Ambulatory Visit (HOSPITAL_COMMUNITY)
Admission: RE | Admit: 2014-04-02 | Discharge: 2014-04-02 | Disposition: A | Discharge status: Home / Self Care | Payer: 59 | Source: Ambulatory Visit | Attending: Pulmonary Disease | Admitting: Pulmonary Disease

## 2014-04-02 DIAGNOSIS — M25512 Pain in left shoulder: Secondary | ICD-10-CM

## 2014-04-02 DIAGNOSIS — M7502 Adhesive capsulitis of left shoulder: Secondary | ICD-10-CM

## 2014-04-02 DIAGNOSIS — Z5189 Encounter for other specified aftercare: Secondary | ICD-10-CM | POA: Diagnosis not present

## 2014-04-02 DIAGNOSIS — R262 Difficulty in walking, not elsewhere classified: Secondary | ICD-10-CM

## 2014-04-02 DIAGNOSIS — R2689 Other abnormalities of gait and mobility: Secondary | ICD-10-CM

## 2014-04-02 DIAGNOSIS — M6281 Muscle weakness (generalized): Secondary | ICD-10-CM

## 2014-04-02 DIAGNOSIS — S14109D Unspecified injury at unspecified level of cervical spinal cord, subsequent encounter: Secondary | ICD-10-CM
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
## 2014-04-03 ENCOUNTER — Encounter: Payer: 59 | Attending: Physical Medicine & Rehabilitation | Admitting: Physical Medicine & Rehabilitation

## 2014-04-03 ENCOUNTER — Encounter: Payer: Self-pay | Admitting: Physical Medicine & Rehabilitation

## 2014-04-03 VITALS — BP 153/81 | HR 73 | Resp 14

## 2014-04-03 DIAGNOSIS — M25512 Pain in left shoulder: Secondary | ICD-10-CM | POA: Diagnosis not present

## 2014-04-03 DIAGNOSIS — N319 Neuromuscular dysfunction of bladder, unspecified: Secondary | ICD-10-CM | POA: Insufficient documentation

## 2014-04-03 DIAGNOSIS — M7502 Adhesive capsulitis of left shoulder: Secondary | ICD-10-CM

## 2014-04-03 DIAGNOSIS — S12200S Unspecified displaced fracture of third cervical vertebra, sequela: Secondary | ICD-10-CM | POA: Insufficient documentation

## 2014-04-03 DIAGNOSIS — S14109S Unspecified injury at unspecified level of cervical spinal cord, sequela: Secondary | ICD-10-CM

## 2014-04-03 DIAGNOSIS — K592 Neurogenic bowel, not elsewhere classified: Secondary | ICD-10-CM | POA: Insufficient documentation

## 2014-04-03 DIAGNOSIS — S129XXS Fracture of neck, unspecified, sequela: Secondary | ICD-10-CM

## 2014-04-03 DIAGNOSIS — S14159S Other incomplete lesion at unspecified level of cervical spinal cord, sequela: Secondary | ICD-10-CM | POA: Diagnosis present

## 2014-04-03 DIAGNOSIS — E1140 Type 2 diabetes mellitus with diabetic neuropathy, unspecified: Secondary | ICD-10-CM | POA: Insufficient documentation

## 2014-04-03 DIAGNOSIS — G8250 Quadriplegia, unspecified: Secondary | ICD-10-CM

## 2014-04-03 DIAGNOSIS — S12090S Other displaced fracture of first cervical vertebra, sequela: Secondary | ICD-10-CM | POA: Diagnosis not present
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
## 2014-04-03 NOTE — Patient Instructions (Signed)
WORK ON THE QUALITY OF YOUR MOVEMENT TO AVOID BAD HABITS

## 2014-04-05 ENCOUNTER — Ambulatory Visit (HOSPITAL_COMMUNITY)
Admission: RE | Admit: 2014-04-05 | Discharge: 2014-04-05 | Disposition: A | Discharge status: Home / Self Care | Payer: 59 | Source: Ambulatory Visit | Attending: Pulmonary Disease | Admitting: Pulmonary Disease

## 2014-04-05 DIAGNOSIS — M6281 Muscle weakness (generalized): Secondary | ICD-10-CM

## 2014-04-05 DIAGNOSIS — M25512 Pain in left shoulder: Secondary | ICD-10-CM

## 2014-04-05 DIAGNOSIS — M7502 Adhesive capsulitis of left shoulder: Secondary | ICD-10-CM

## 2014-04-05 DIAGNOSIS — Z5189 Encounter for other specified aftercare: Secondary | ICD-10-CM | POA: Diagnosis not present

## 2014-04-05 DIAGNOSIS — R262 Difficulty in walking, not elsewhere classified: Secondary | ICD-10-CM

## 2014-04-05 DIAGNOSIS — R2689 Other abnormalities of gait and mobility: Secondary | ICD-10-CM
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
## 2014-04-11 ENCOUNTER — Encounter (HOSPITAL_COMMUNITY): Payer: Self-pay

## 2014-04-11 ENCOUNTER — Ambulatory Visit (HOSPITAL_COMMUNITY): Payer: 59

## 2014-04-11 DIAGNOSIS — R262 Difficulty in walking, not elsewhere classified: Secondary | ICD-10-CM

## 2014-04-11 DIAGNOSIS — M6281 Muscle weakness (generalized): Secondary | ICD-10-CM

## 2014-04-11 DIAGNOSIS — Z5189 Encounter for other specified aftercare: Secondary | ICD-10-CM | POA: Diagnosis not present

## 2014-04-11 DIAGNOSIS — R2689 Other abnormalities of gait and mobility: Secondary | ICD-10-CM
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
## 2014-04-13 ENCOUNTER — Ambulatory Visit (HOSPITAL_COMMUNITY): Payer: 59

## 2014-04-13 ENCOUNTER — Encounter (HOSPITAL_COMMUNITY): Payer: Self-pay

## 2014-04-13 DIAGNOSIS — Z5189 Encounter for other specified aftercare: Secondary | ICD-10-CM | POA: Diagnosis not present

## 2014-04-13 DIAGNOSIS — M6281 Muscle weakness (generalized): Secondary | ICD-10-CM
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
## 2014-04-16 ENCOUNTER — Encounter (HOSPITAL_COMMUNITY): Payer: Self-pay

## 2014-04-16 ENCOUNTER — Ambulatory Visit (HOSPITAL_COMMUNITY): Payer: 59

## 2014-04-16 ENCOUNTER — Ambulatory Visit (HOSPITAL_COMMUNITY): Payer: 59 | Admitting: Physical Therapy

## 2014-04-16 DIAGNOSIS — R2689 Other abnormalities of gait and mobility: Secondary | ICD-10-CM

## 2014-04-16 DIAGNOSIS — M6281 Muscle weakness (generalized): Secondary | ICD-10-CM

## 2014-04-16 DIAGNOSIS — Z5189 Encounter for other specified aftercare: Secondary | ICD-10-CM | POA: Diagnosis not present

## 2014-04-16 DIAGNOSIS — R262 Difficulty in walking, not elsewhere classified: Secondary | ICD-10-CM
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
## 2014-04-17 ENCOUNTER — Telehealth: Payer: Self-pay | Admitting: *Deleted

## 2014-04-17 NOTE — Telephone Encounter (Signed)
Call regarding Botox, [facility] is asking for clarification on injection sites and frequency of how often it will be injected

## 2014-04-19 ENCOUNTER — Ambulatory Visit (HOSPITAL_COMMUNITY): Payer: 59

## 2014-04-19 DIAGNOSIS — R2689 Other abnormalities of gait and mobility: Secondary | ICD-10-CM

## 2014-04-19 DIAGNOSIS — Z5189 Encounter for other specified aftercare: Secondary | ICD-10-CM | POA: Diagnosis not present

## 2014-04-19 DIAGNOSIS — M25512 Pain in left shoulder: Secondary | ICD-10-CM

## 2014-04-19 DIAGNOSIS — R262 Difficulty in walking, not elsewhere classified: Secondary | ICD-10-CM

## 2014-04-19 DIAGNOSIS — M6281 Muscle weakness (generalized): Secondary | ICD-10-CM
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
## 2014-04-19 NOTE — Patient Instructions (Addendum)
Back Leg Kick   Swing leg back as far as possible. Return to center. Stand TALL!  Repeat other leg   Stand, both feet flat. Bend right knee, bringing heel toward buttocks. Use ___ lbs. Complete 2 sets of 10 repetitions. Perform 1-2 sessions per day.  http://gtsc.exer.us/240   Copyright  VHI. All rights reserved.   Repeat 10-20  times. Do 1-2 sessions per day.  http://gt2.exer.us/483   Copyright  VHI. All rights reserved.   Abduction: Side Leg Lift (Eccentric) - Side-Lying   Lie on side. Lift top leg slightly higher than shoulder level. Keep top leg straight with body, toes pointing forward. Slowly lower for 3-5 seconds. 10-20 reps per set, 3-5 days per week. Add ___ lbs when you achieve ___ repetitions.  Copyright  VHI. All rights reserved.   Functional Quadriceps: Sit to Stand   Sit on edge of chair, feet flat on floor. Stand upright, extending knees fully. Repeat 10-20 times per set. Do 1-2 sets per session. Do 3-5 sessions per day.  http://orth.exer.us/734   Copyright  VHI. All rights reserved.   SINGLE LIMB STANCE   Stance: single leg on floor. Raise leg. Hold 30-60 seconds. Repeat with other leg. 3  reps per set, 2 sets per day, 5 days per week  Copyright  VHI. All rights reserved.
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
## 2014-04-23 ENCOUNTER — Ambulatory Visit (HOSPITAL_COMMUNITY): Payer: 59 | Admitting: Physical Therapy

## 2014-04-23 ENCOUNTER — Ambulatory Visit (HOSPITAL_COMMUNITY): Payer: 59

## 2014-04-23 DIAGNOSIS — Z5189 Encounter for other specified aftercare: Secondary | ICD-10-CM | POA: Diagnosis not present

## 2014-04-23 DIAGNOSIS — M6281 Muscle weakness (generalized): Secondary | ICD-10-CM
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
## 2014-04-24 ENCOUNTER — Encounter (HOSPITAL_COMMUNITY): Payer: Self-pay | Admitting: Physical Therapy

## 2014-04-26 ENCOUNTER — Ambulatory Visit (HOSPITAL_COMMUNITY): Payer: 59 | Attending: Pulmonary Disease | Admitting: Occupational Therapy

## 2014-04-26 ENCOUNTER — Encounter (HOSPITAL_COMMUNITY): Payer: 59 | Admitting: Physical Therapy

## 2014-04-26 ENCOUNTER — Encounter (HOSPITAL_COMMUNITY): Payer: Self-pay | Admitting: Occupational Therapy

## 2014-04-26 DIAGNOSIS — M25512 Pain in left shoulder: Secondary | ICD-10-CM

## 2014-04-26 DIAGNOSIS — Z5189 Encounter for other specified aftercare: Secondary | ICD-10-CM | POA: Insufficient documentation

## 2014-04-26 DIAGNOSIS — M7502 Adhesive capsulitis of left shoulder: Secondary | ICD-10-CM | POA: Diagnosis not present

## 2014-04-26 DIAGNOSIS — M6281 Muscle weakness (generalized): Secondary | ICD-10-CM | POA: Diagnosis not present
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
## 2014-05-02 ENCOUNTER — Ambulatory Visit (HOSPITAL_COMMUNITY): Payer: 59 | Admitting: Occupational Therapy

## 2014-05-02 ENCOUNTER — Encounter (HOSPITAL_COMMUNITY): Payer: Self-pay | Admitting: Occupational Therapy

## 2014-05-02 ENCOUNTER — Encounter (HOSPITAL_COMMUNITY): Payer: 59 | Admitting: Occupational Therapy

## 2014-05-02 DIAGNOSIS — Z5189 Encounter for other specified aftercare: Secondary | ICD-10-CM | POA: Diagnosis not present

## 2014-05-02 DIAGNOSIS — M25512 Pain in left shoulder: Secondary | ICD-10-CM

## 2014-05-02 DIAGNOSIS — M6281 Muscle weakness (generalized): Secondary | ICD-10-CM
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
## 2014-05-04 ENCOUNTER — Encounter (HOSPITAL_COMMUNITY): Payer: Self-pay

## 2014-05-04 ENCOUNTER — Ambulatory Visit (HOSPITAL_COMMUNITY): Payer: 59

## 2014-05-04 DIAGNOSIS — M25512 Pain in left shoulder: Secondary | ICD-10-CM

## 2014-05-04 DIAGNOSIS — Z5189 Encounter for other specified aftercare: Secondary | ICD-10-CM | POA: Diagnosis not present
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
## 2014-05-04 NOTE — Patient Instructions (Signed)
Left hand Flexion Glove Wear for 30 minutes at time every 2-3 hours

## 2014-05-09 ENCOUNTER — Encounter: Payer: Self-pay | Admitting: Physical Medicine & Rehabilitation

## 2014-05-09 ENCOUNTER — Encounter: Payer: 59 | Attending: Physical Medicine & Rehabilitation | Admitting: Physical Medicine & Rehabilitation

## 2014-05-09 ENCOUNTER — Encounter (HOSPITAL_COMMUNITY): Payer: Self-pay

## 2014-05-09 VITALS — BP 156/70 | HR 82 | Resp 14

## 2014-05-09 DIAGNOSIS — M25512 Pain in left shoulder: Secondary | ICD-10-CM | POA: Insufficient documentation

## 2014-05-09 DIAGNOSIS — K592 Neurogenic bowel, not elsewhere classified: Secondary | ICD-10-CM | POA: Insufficient documentation

## 2014-05-09 DIAGNOSIS — G8250 Quadriplegia, unspecified: Secondary | ICD-10-CM

## 2014-05-09 DIAGNOSIS — S14159S Other incomplete lesion at unspecified level of cervical spinal cord, sequela: Secondary | ICD-10-CM | POA: Insufficient documentation

## 2014-05-09 DIAGNOSIS — M7502 Adhesive capsulitis of left shoulder: Secondary | ICD-10-CM | POA: Diagnosis not present

## 2014-05-09 DIAGNOSIS — N319 Neuromuscular dysfunction of bladder, unspecified: Secondary | ICD-10-CM | POA: Diagnosis not present

## 2014-05-09 DIAGNOSIS — E1140 Type 2 diabetes mellitus with diabetic neuropathy, unspecified: Secondary | ICD-10-CM | POA: Insufficient documentation

## 2014-05-09 DIAGNOSIS — S12090S Other displaced fracture of first cervical vertebra, sequela: Secondary | ICD-10-CM | POA: Diagnosis not present

## 2014-05-09 DIAGNOSIS — S12200S Unspecified displaced fracture of third cervical vertebra, sequela: Secondary | ICD-10-CM | POA: Diagnosis not present

## 2014-05-09 NOTE — Patient Instructions (Signed)
PLEASE CALL ME WITH ANY PROBLEMS OR QUESTIONS (#[phone]).      

## 2014-05-09 NOTE — Progress Notes (Signed)
Botox Injection for spasticity using needle EMG guidance Indication: spastic left hemiparesis---LUE  Dilution: 100 Units/ml        Total Units Injected: 400 Indication: Severe spasticity which interferes with ADL,mobility and/or  hygiene and is unresponsive to medication management and other conservative care Informed consent was obtained after describing risks and benefits of the procedure with the patient. This includes bleeding, bruising, infection, excessive weakness, or medication side effects. A REMS form is on file and signed.  Needle: 50mm injectable monopolar needle electrode  Number of units per muscle Pectoralis Major 125 units Pectoralis Minor 75 units Biceps 0 units Brachioradialis 0 units FCR 0 units FCU 0 units FDS 50 units FDP 50 units FPL 0 units Pronator Teres 100 units Pronator Quadratus 0 units Quadriceps 0 units Gastroc/soleus 0 units Hamstrings 0 units Tibialis Posterior 0 units EHL 0 units All injections were done after obtaining appropriate EMG activity and after negative drawback for blood. The patient tolerated the procedure well. Post procedure instructions were given. A followup appointment was made.   Resume OT in about 10 days. [name] remains out of work at this time.

## 2014-05-28 ENCOUNTER — Ambulatory Visit (HOSPITAL_COMMUNITY): Payer: 59 | Attending: Pulmonary Disease

## 2014-05-28 ENCOUNTER — Encounter (HOSPITAL_COMMUNITY): Payer: Self-pay

## 2014-05-28 DIAGNOSIS — M6281 Muscle weakness (generalized): Secondary | ICD-10-CM | POA: Insufficient documentation

## 2014-05-28 DIAGNOSIS — M7502 Adhesive capsulitis of left shoulder: Secondary | ICD-10-CM | POA: Insufficient documentation

## 2014-05-28 DIAGNOSIS — M25612 Stiffness of left shoulder, not elsewhere classified: Secondary | ICD-10-CM

## 2014-05-28 DIAGNOSIS — Z5189 Encounter for other specified aftercare: Secondary | ICD-10-CM | POA: Insufficient documentation

## 2014-05-28 DIAGNOSIS — IMO0002 Reserved for concepts with insufficient information to code with codable children: Secondary | ICD-10-CM
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
## 2014-06-01 ENCOUNTER — Encounter (HOSPITAL_COMMUNITY): Payer: Self-pay

## 2014-06-01 ENCOUNTER — Ambulatory Visit (HOSPITAL_COMMUNITY): Payer: 59

## 2014-06-01 DIAGNOSIS — IMO0002 Reserved for concepts with insufficient information to code with codable children: Secondary | ICD-10-CM

## 2014-06-01 DIAGNOSIS — M25612 Stiffness of left shoulder, not elsewhere classified: Secondary | ICD-10-CM

## 2014-06-01 DIAGNOSIS — M6281 Muscle weakness (generalized): Secondary | ICD-10-CM

## 2014-06-01 DIAGNOSIS — Z5189 Encounter for other specified aftercare: Secondary | ICD-10-CM | POA: Diagnosis not present

## 2014-06-01 DIAGNOSIS — M25512 Pain in left shoulder: Secondary | ICD-10-CM

## 2014-06-01 NOTE — Patient Instructions (Signed)
Flexors Stretch, Standing   Stand near wall and slide arm up, with palm facing away from wall.. Hold _10__ seconds.  Repeat __3_ times per session. Do ___ sessions per day.  Copyright  VHI. All rights reserved.   ROM: Anterior Glide - Extension  * Do this with the door knob. Hold onto handle and turn as if you are closing the door but don't let go of the handle.*   With left arm resting comfortably on table behind, apply gentle force down and slightly forward through shoulder. Hold __10__ seconds. Relax. Repeat _3___ times per set. Do __1__ sets per session. Do _multiplePectoral Stretch   With arms behind doorjamb, gently lean forward. Stretch is felt across chest. Hold _10___ seconds. Repeat _3__ times. Do __1__ sessions per day.  Wall Push-Up   With feet and hands shoulder-width apart, lean into wall, then push away from wall. Repeat _10___ times or for _1___ minutes. Do _multiple___ sessions per day.  ROM: Caudal Glide   Hold edge of chair firmly with right hand. Lean trunk away from stabilized arm. Hold __10__ seconds. Repeat __3__ times per set. Do __1__ sets per session. Do __multiple__ sessions per day.  http://orth.exer.us/768   Copyright  VHI. All rights reserved.
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
## 2014-06-06 ENCOUNTER — Ambulatory Visit (HOSPITAL_COMMUNITY): Payer: 59

## 2014-06-06 ENCOUNTER — Encounter (HOSPITAL_COMMUNITY): Payer: Self-pay

## 2014-06-06 DIAGNOSIS — M6281 Muscle weakness (generalized): Secondary | ICD-10-CM

## 2014-06-06 DIAGNOSIS — IMO0002 Reserved for concepts with insufficient information to code with codable children: Secondary | ICD-10-CM

## 2014-06-06 DIAGNOSIS — Z5189 Encounter for other specified aftercare: Secondary | ICD-10-CM | POA: Diagnosis not present

## 2014-06-06 DIAGNOSIS — M25512 Pain in left shoulder: Secondary | ICD-10-CM

## 2014-06-06 DIAGNOSIS — M25612 Stiffness of left shoulder, not elsewhere classified: Secondary | ICD-10-CM
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
## 2014-06-08 ENCOUNTER — Ambulatory Visit (HOSPITAL_COMMUNITY): Payer: 59

## 2014-06-08 ENCOUNTER — Encounter (HOSPITAL_COMMUNITY): Payer: Self-pay

## 2014-06-08 DIAGNOSIS — IMO0002 Reserved for concepts with insufficient information to code with codable children: Secondary | ICD-10-CM

## 2014-06-08 DIAGNOSIS — Z5189 Encounter for other specified aftercare: Secondary | ICD-10-CM | POA: Diagnosis not present

## 2014-06-08 DIAGNOSIS — M25512 Pain in left shoulder: Secondary | ICD-10-CM

## 2014-06-08 DIAGNOSIS — M6281 Muscle weakness (generalized): Secondary | ICD-10-CM

## 2014-06-08 DIAGNOSIS — M25612 Stiffness of left shoulder, not elsewhere classified: Secondary | ICD-10-CM
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
## 2014-06-12 ENCOUNTER — Ambulatory Visit (HOSPITAL_COMMUNITY): Payer: 59

## 2014-06-12 ENCOUNTER — Encounter (HOSPITAL_COMMUNITY): Payer: Self-pay

## 2014-06-12 DIAGNOSIS — M25512 Pain in left shoulder: Secondary | ICD-10-CM

## 2014-06-12 DIAGNOSIS — IMO0002 Reserved for concepts with insufficient information to code with codable children: Secondary | ICD-10-CM

## 2014-06-12 DIAGNOSIS — Z5189 Encounter for other specified aftercare: Secondary | ICD-10-CM | POA: Diagnosis not present

## 2014-06-12 DIAGNOSIS — M25612 Stiffness of left shoulder, not elsewhere classified: Secondary | ICD-10-CM

## 2014-06-12 DIAGNOSIS — M6281 Muscle weakness (generalized): Secondary | ICD-10-CM
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
## 2014-06-15 ENCOUNTER — Ambulatory Visit (HOSPITAL_COMMUNITY): Payer: 59

## 2014-06-15 ENCOUNTER — Encounter (HOSPITAL_COMMUNITY): Payer: Self-pay

## 2014-06-15 DIAGNOSIS — M6281 Muscle weakness (generalized): Secondary | ICD-10-CM

## 2014-06-15 DIAGNOSIS — M25612 Stiffness of left shoulder, not elsewhere classified: Secondary | ICD-10-CM

## 2014-06-15 DIAGNOSIS — IMO0002 Reserved for concepts with insufficient information to code with codable children: Secondary | ICD-10-CM

## 2014-06-15 DIAGNOSIS — Z5189 Encounter for other specified aftercare: Secondary | ICD-10-CM | POA: Diagnosis not present

## 2014-06-15 DIAGNOSIS — M25512 Pain in left shoulder: Secondary | ICD-10-CM
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
## 2014-06-19 ENCOUNTER — Ambulatory Visit (HOSPITAL_COMMUNITY): Payer: 59

## 2014-06-19 ENCOUNTER — Encounter (HOSPITAL_COMMUNITY): Payer: Self-pay

## 2014-06-19 DIAGNOSIS — M25512 Pain in left shoulder: Secondary | ICD-10-CM

## 2014-06-19 DIAGNOSIS — M25612 Stiffness of left shoulder, not elsewhere classified: Secondary | ICD-10-CM

## 2014-06-19 DIAGNOSIS — IMO0002 Reserved for concepts with insufficient information to code with codable children: Secondary | ICD-10-CM

## 2014-06-19 DIAGNOSIS — Z5189 Encounter for other specified aftercare: Secondary | ICD-10-CM | POA: Diagnosis not present
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
## 2014-06-22 ENCOUNTER — Ambulatory Visit (HOSPITAL_COMMUNITY): Payer: 59 | Admitting: Occupational Therapy

## 2014-06-22 ENCOUNTER — Encounter (HOSPITAL_COMMUNITY): Payer: Self-pay | Admitting: Occupational Therapy

## 2014-06-22 DIAGNOSIS — M25512 Pain in left shoulder: Secondary | ICD-10-CM

## 2014-06-22 DIAGNOSIS — M6281 Muscle weakness (generalized): Secondary | ICD-10-CM

## 2014-06-22 DIAGNOSIS — M25612 Stiffness of left shoulder, not elsewhere classified: Secondary | ICD-10-CM

## 2014-06-22 DIAGNOSIS — Z5189 Encounter for other specified aftercare: Secondary | ICD-10-CM | POA: Diagnosis not present
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
## 2014-06-22 NOTE — Patient Instructions (Signed)
ROM: Flexion - Wand   Bring wand directly over head, leading with right side. Reach back until stretch is felt. Repeat _10-15___ times per set. Do __1__ sets per session. Do _1-2___ sessions per day.  http://orth.exer.us/744   Copyright  VHI. All rights reserved.   ROM: Abduction - Wand   Holding wand with left hand palm up, push wand directly out to side, leading with other hand palm down, until stretch is felt.  Repeat __10-15__ times per set. Do __1__ sets per session. Do _1-2___ sessions per day.  http://orth.exer.us/746   Copyright  VHI. All rights reserved.   ROM: Horizontal Abduction / Adduction - Wand   Keeping both palms down, push right hand across body with other hand. Then pull back across body, keeping arms parallel to floor. Do not allow trunk to twist.  Repeat _10-15___ times per set. Do _1___ sets per session. Do _1-2__ sessions per day.  http://orth.exer.us/752   Copyright  VHI. All rights reserved.   ROM: External / Internal Rotation - Wand   Holding wand with left hand palm up, push out from body with other hand, palm down. Keep both elbows bent.  Repeat to other side, leading with same hand. Keep elbows bent. Repeat __10-15__ times per set. Do __1__ sets per session. Do _1-2___ sessions per day.  http://orth.exer.us/748   Copyright  VHI. All rights reserved.   

## 2014-06-26 ENCOUNTER — Ambulatory Visit (HOSPITAL_COMMUNITY): Payer: 59 | Attending: Pulmonary Disease

## 2014-06-26 ENCOUNTER — Encounter (HOSPITAL_COMMUNITY): Payer: Self-pay

## 2014-06-26 DIAGNOSIS — Z5189 Encounter for other specified aftercare: Secondary | ICD-10-CM | POA: Insufficient documentation

## 2014-06-26 DIAGNOSIS — IMO0002 Reserved for concepts with insufficient information to code with codable children: Secondary | ICD-10-CM

## 2014-06-26 DIAGNOSIS — M6281 Muscle weakness (generalized): Secondary | ICD-10-CM | POA: Insufficient documentation

## 2014-06-26 DIAGNOSIS — M7502 Adhesive capsulitis of left shoulder: Secondary | ICD-10-CM | POA: Insufficient documentation

## 2014-06-26 DIAGNOSIS — M25612 Stiffness of left shoulder, not elsewhere classified: Secondary | ICD-10-CM
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
## 2014-06-29 ENCOUNTER — Encounter (HOSPITAL_COMMUNITY): Payer: Self-pay

## 2014-06-29 ENCOUNTER — Ambulatory Visit (HOSPITAL_COMMUNITY): Payer: 59

## 2014-06-29 DIAGNOSIS — IMO0002 Reserved for concepts with insufficient information to code with codable children: Secondary | ICD-10-CM

## 2014-06-29 DIAGNOSIS — M25612 Stiffness of left shoulder, not elsewhere classified: Secondary | ICD-10-CM

## 2014-06-29 DIAGNOSIS — Z5189 Encounter for other specified aftercare: Secondary | ICD-10-CM | POA: Diagnosis not present

## 2014-06-29 DIAGNOSIS — M25512 Pain in left shoulder: Secondary | ICD-10-CM

## 2014-06-29 DIAGNOSIS — M6281 Muscle weakness (generalized): Secondary | ICD-10-CM
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
## 2014-07-03 ENCOUNTER — Encounter (HOSPITAL_COMMUNITY): Payer: Self-pay

## 2014-07-03 ENCOUNTER — Ambulatory Visit (HOSPITAL_COMMUNITY): Payer: 59

## 2014-07-03 DIAGNOSIS — M25612 Stiffness of left shoulder, not elsewhere classified: Secondary | ICD-10-CM

## 2014-07-03 DIAGNOSIS — M6281 Muscle weakness (generalized): Secondary | ICD-10-CM

## 2014-07-03 DIAGNOSIS — Z5189 Encounter for other specified aftercare: Secondary | ICD-10-CM | POA: Diagnosis not present
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
## 2014-07-06 ENCOUNTER — Ambulatory Visit (HOSPITAL_COMMUNITY): Payer: 59 | Admitting: Occupational Therapy

## 2014-07-06 ENCOUNTER — Encounter (HOSPITAL_COMMUNITY): Payer: Self-pay | Admitting: Occupational Therapy

## 2014-07-06 DIAGNOSIS — M25612 Stiffness of left shoulder, not elsewhere classified: Secondary | ICD-10-CM

## 2014-07-06 DIAGNOSIS — M6281 Muscle weakness (generalized): Secondary | ICD-10-CM

## 2014-07-06 DIAGNOSIS — M25512 Pain in left shoulder: Secondary | ICD-10-CM

## 2014-07-06 DIAGNOSIS — Z5189 Encounter for other specified aftercare: Secondary | ICD-10-CM | POA: Diagnosis not present
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
## 2014-07-10 ENCOUNTER — Encounter: Payer: 59 | Attending: Physical Medicine & Rehabilitation | Admitting: Physical Medicine & Rehabilitation

## 2014-07-10 ENCOUNTER — Encounter: Payer: Self-pay | Admitting: Physical Medicine & Rehabilitation

## 2014-07-10 VITALS — BP 154/68 | HR 91 | Resp 14

## 2014-07-10 DIAGNOSIS — S14159S Other incomplete lesion at unspecified level of cervical spinal cord, sequela: Secondary | ICD-10-CM | POA: Insufficient documentation

## 2014-07-10 DIAGNOSIS — M7502 Adhesive capsulitis of left shoulder: Secondary | ICD-10-CM | POA: Diagnosis not present

## 2014-07-10 DIAGNOSIS — E1140 Type 2 diabetes mellitus with diabetic neuropathy, unspecified: Secondary | ICD-10-CM | POA: Insufficient documentation

## 2014-07-10 DIAGNOSIS — S12200S Unspecified displaced fracture of third cervical vertebra, sequela: Secondary | ICD-10-CM | POA: Insufficient documentation

## 2014-07-10 DIAGNOSIS — K592 Neurogenic bowel, not elsewhere classified: Secondary | ICD-10-CM | POA: Insufficient documentation

## 2014-07-10 DIAGNOSIS — N319 Neuromuscular dysfunction of bladder, unspecified: Secondary | ICD-10-CM | POA: Insufficient documentation

## 2014-07-10 DIAGNOSIS — S14109S Unspecified injury at unspecified level of cervical spinal cord, sequela: Secondary | ICD-10-CM | POA: Diagnosis not present

## 2014-07-10 DIAGNOSIS — M25512 Pain in left shoulder: Secondary | ICD-10-CM | POA: Insufficient documentation

## 2014-07-10 DIAGNOSIS — S129XXS Fracture of neck, unspecified, sequela: Secondary | ICD-10-CM | POA: Diagnosis not present

## 2014-07-10 DIAGNOSIS — S12090S Other displaced fracture of first cervical vertebra, sequela: Secondary | ICD-10-CM | POA: Insufficient documentation

## 2014-07-10 DIAGNOSIS — G8250 Quadriplegia, unspecified: Secondary | ICD-10-CM | POA: Diagnosis not present

## 2014-07-10 NOTE — Patient Instructions (Signed)
Continue working on your ROM each and every day at home.  You are doing a great job!!!
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
## 2014-07-18 ENCOUNTER — Encounter (HOSPITAL_COMMUNITY): Payer: Self-pay | Admitting: Occupational Therapy

## 2014-07-18 ENCOUNTER — Ambulatory Visit (HOSPITAL_COMMUNITY): Payer: 59 | Admitting: Occupational Therapy

## 2014-07-18 DIAGNOSIS — M25612 Stiffness of left shoulder, not elsewhere classified: Secondary | ICD-10-CM

## 2014-07-18 DIAGNOSIS — M25512 Pain in left shoulder: Secondary | ICD-10-CM

## 2014-07-18 DIAGNOSIS — Z5189 Encounter for other specified aftercare: Secondary | ICD-10-CM | POA: Diagnosis not present

## 2014-07-18 DIAGNOSIS — M6281 Muscle weakness (generalized): Secondary | ICD-10-CM
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
## 2014-07-20 ENCOUNTER — Ambulatory Visit (HOSPITAL_COMMUNITY): Payer: 59 | Admitting: Occupational Therapy

## 2014-08-07 ENCOUNTER — Encounter: Payer: Self-pay | Admitting: Physical Medicine & Rehabilitation

## 2014-08-07 ENCOUNTER — Encounter: Payer: Medicare Other | Attending: Physical Medicine & Rehabilitation | Admitting: Physical Medicine & Rehabilitation

## 2014-08-07 VITALS — BP 174/70 | HR 88 | Resp 14

## 2014-08-07 DIAGNOSIS — N319 Neuromuscular dysfunction of bladder, unspecified: Secondary | ICD-10-CM | POA: Diagnosis not present

## 2014-08-07 DIAGNOSIS — K592 Neurogenic bowel, not elsewhere classified: Secondary | ICD-10-CM | POA: Insufficient documentation

## 2014-08-07 DIAGNOSIS — S12090S Other displaced fracture of first cervical vertebra, sequela: Secondary | ICD-10-CM | POA: Diagnosis not present

## 2014-08-07 DIAGNOSIS — E1140 Type 2 diabetes mellitus with diabetic neuropathy, unspecified: Secondary | ICD-10-CM | POA: Insufficient documentation

## 2014-08-07 DIAGNOSIS — M7502 Adhesive capsulitis of left shoulder: Secondary | ICD-10-CM | POA: Insufficient documentation

## 2014-08-07 DIAGNOSIS — G8250 Quadriplegia, unspecified: Secondary | ICD-10-CM

## 2014-08-07 DIAGNOSIS — M25512 Pain in left shoulder: Secondary | ICD-10-CM | POA: Diagnosis not present

## 2014-08-07 DIAGNOSIS — S14159S Other incomplete lesion at unspecified level of cervical spinal cord, sequela: Secondary | ICD-10-CM | POA: Insufficient documentation

## 2014-08-07 DIAGNOSIS — S12200S Unspecified displaced fracture of third cervical vertebra, sequela: Secondary | ICD-10-CM | POA: Diagnosis not present

## 2014-08-07 NOTE — Progress Notes (Signed)
Botox Injection for spasticity using needle EMG guidance Indication: spastic tetraplegia---LUE  Dilution: 100 Units/ml        Total Units Injected: 400 Indication: Severe spasticity which interferes with ADL,mobility and/or  hygiene and is unresponsive to medication management and other conservative care Informed consent was obtained after describing risks and benefits of the procedure with the patient. This includes bleeding, bruising, infection, excessive weakness, or medication side effects. A REMS form is on file and signed.  Needle: 84mm injectable monopolar needle electrode  Number of units per muscle Pectoralis Major 50 units Pectoralis Minor 50 units Teres Major/minor 100 units Brachioradialis 0 units FCR 25 units FCU 25 units FDS 25 units FDP 25 units FPL 0 units Pronator Teres 100 units Pronator Quadratus 0 units Quadriceps 0 units Gastroc/soleus 0 units Hamstrings 0 units Tibialis Posterior 0 units EHL 0 units All injections were done after obtaining appropriate EMG activity and after negative drawback for blood. The patient tolerated the procedure well. Post procedure instructions were given. A followup appointment was made.   A referral was made for outpt OT

## 2014-08-07 NOTE — Patient Instructions (Signed)
PLEASE CALL ME WITH ANY PROBLEMS OR QUESTIONS (#[phone]).  HAVE A GOOD DAY!    

## 2014-09-03 ENCOUNTER — Ambulatory Visit (HOSPITAL_COMMUNITY): Payer: Medicare Other | Attending: Pulmonary Disease

## 2014-09-03 ENCOUNTER — Encounter (HOSPITAL_COMMUNITY): Payer: Self-pay

## 2014-09-03 DIAGNOSIS — S14109D Unspecified injury at unspecified level of cervical spinal cord, subsequent encounter: Secondary | ICD-10-CM | POA: Diagnosis not present

## 2014-09-03 DIAGNOSIS — X58XXXD Exposure to other specified factors, subsequent encounter: Secondary | ICD-10-CM | POA: Insufficient documentation

## 2014-09-03 DIAGNOSIS — M6281 Muscle weakness (generalized): Secondary | ICD-10-CM | POA: Diagnosis not present

## 2014-09-03 DIAGNOSIS — R279 Unspecified lack of coordination: Secondary | ICD-10-CM

## 2014-09-03 DIAGNOSIS — M25612 Stiffness of left shoulder, not elsewhere classified: Secondary | ICD-10-CM | POA: Insufficient documentation

## 2014-09-03 DIAGNOSIS — M7582 Other shoulder lesions, left shoulder: Secondary | ICD-10-CM | POA: Insufficient documentation

## 2014-09-03 DIAGNOSIS — M25512 Pain in left shoulder: Secondary | ICD-10-CM | POA: Diagnosis not present

## 2014-09-03 DIAGNOSIS — X58XXXS Exposure to other specified factors, sequela: Secondary | ICD-10-CM | POA: Insufficient documentation

## 2014-09-03 DIAGNOSIS — M25642 Stiffness of left hand, not elsewhere classified: Secondary | ICD-10-CM | POA: Insufficient documentation

## 2014-09-03 DIAGNOSIS — S129XXS Fracture of neck, unspecified, sequela: Secondary | ICD-10-CM | POA: Diagnosis not present

## 2014-09-03 DIAGNOSIS — R29898 Other symptoms and signs involving the musculoskeletal system: Secondary | ICD-10-CM
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
## 2014-09-03 NOTE — Patient Instructions (Addendum)
Flexors Stretch, Standing   Stand near wall and slide arm up, with palm facing away from wall.. Hold _10__ seconds.  Repeat __3_ times per session. Do ___ sessions per day.  Copyright  VHI. All rights reserved.   ROM: Anterior Glide - Extension * Do this with the door knob. Hold onto handle and turn as if you are closing the door but don't let go of the handle.*   With left arm resting comfortably on table behind, apply gentle force down and slightly forward through shoulder. Hold __10__ seconds. Relax. Repeat _3___ times per set. Do __1__ sets per session. Do _multiplePectoral Stretch   With arms behind doorjamb, gently lean forward. Stretch is felt across chest. Hold _10___ seconds. Repeat _3__ times. Do __1__ sessions per day.  Wall Push-Up   With feet and hands shoulder-width apart, lean into wall, then push away from wall. Repeat _10___ times or for _1___ minutes. Do _multiple___ sessions per day.  ROM: Caudal Glide   Hold edge of chair firmly with right hand. Lean trunk away from stabilized arm. Hold __10__ seconds. Repeat __3__ times per set. Do __1__ sets per session. Do __multiple__ sessions per day.  http://orth.exer.us/768   Copyright  VHI. All rights reserved.   Perform each exercise __10______ reps. 2-3x days.   Protraction - STANDING  Start by holding a wand or cane at chest height.  Next, slowly push the wand outwards in front of your body so that your elbows become fully straightened. Then, return to the original position.     Shoulder FLEXION - STANDING - PALMS UP  In the standing position, hold a wand/cane with both arms, palms up on both sides. Raise up the wand/cane allowing your unaffected arm to perform most of the effort. Your affected arm should be partially relaxed.      Internal/External ROTATION - STANDING  In the standing position, hold a wand/cane with both hands keeping your elbows bent. Move your arms and wand/cane to one side.   Your affected arm should be partially relaxed while your unaffected arm performs most of the effort.       Shoulder ABDUCTION - STANDING  While holding a wand/cane palm face up on the injured side and palm face down on the uninjured side, slowly raise up your injured arm to the side.          Horizontal Abduction/Adduction      Straight arms holding cane at shoulder height, bring cane to right, center, left. Repeat starting to left.   Copyright  VHI. All rights reserved.   Home Exercises Program Theraputty Exercises  Do the following exercises 2 times a day using your affected hand.  1. Roll putty into a ball.  2. Make into a pancake.  3. Roll putty into a roll.  4. Pinch along log with first finger and thumb.   5. Make into a ball.  6. Roll it back into a log.   7. Pinch using thumb and side of first finger.  8. Roll into a ball, then flatten into a pancake.  9. Using your fingers, make putty into a mountain.

## 2014-09-03 NOTE — Therapy (Signed)
[place] [address] [place], [place], [address] Phone: [phone]   Fax:  [phone]  Patient Details  Name: [name]. MRN: [id number] Date of Birth: October 22, 1945 Referring Provider:  No ref. provider found  Encounter Date: 09/03/2014 OCCUPATIONAL THERAPY DISCHARGE SUMMARY  Visits from Start of Care: 45  Current functional level related to goals / functional outcomes: OT LONG TERM GOAL #1    Title Patient will be independent with dressing, bathing, grooming, IADLs, and return to work.   Status On-going   OT LONG TERM GOAL #2   Title Patient will have WFL AROM in left arm and hand for increased ability to complete all daily activities.    Status On-going   OT LONG TERM GOAL #3   Title Patient will have 4-/5 strength or better in his LUE in order to complete all necessary work activities.    Status On-going   OT LONG TERM GOAL #4   Title Patient will increase bilateral grip strength by 40 pounds and pinch strength by 10 pounds for increased ability to grip glassware.   Status On-going   OT LONG TERM GOAL #5   Title Patient will increase [place] in left hand to [facility] by completing the Nine Hole Peg Test in 35" or less for increased ability to string fishing pole.   Status On-going   OT LONG TERM GOAL #6   Title Patient will have trace pain in his left shoulder with activity.   OT LONG TERM GOAL #7   Title Patient will have trace edema in his left hand and wrist.           Remaining deficits: Pt's last OT session was 07/18/14. Pt is being discharged due to not returning since last schedule visit. Pt met 2/7 LTGs.  Pt is scheduled for an OT eval by Dr. Alger Simons for eval and treat for ROM of LUE after Botox. Pt will be evaluated by OT on 09/03/14.     Plan:                                                    Patient goals were not met. Patient is being discharged due to not  returning since the last visit.  ?????       Ailene Ravel, OTR/L,CBIS  [phone]  09/03/2014, 1:35 PM  [place] [address] [place], [place], [address] Phone: [phone]   Fax:  [phone]

## 2014-09-05 ENCOUNTER — Telehealth: Payer: Self-pay | Admitting: Physical Medicine & Rehabilitation

## 2014-09-05 ENCOUNTER — Ambulatory Visit (HOSPITAL_COMMUNITY): Payer: Medicare Other | Admitting: Occupational Therapy

## 2014-09-05 ENCOUNTER — Encounter (HOSPITAL_COMMUNITY): Payer: Self-pay | Admitting: Occupational Therapy

## 2014-09-05 DIAGNOSIS — M6281 Muscle weakness (generalized): Secondary | ICD-10-CM

## 2014-09-05 DIAGNOSIS — IMO0002 Reserved for concepts with insufficient information to code with codable children: Secondary | ICD-10-CM

## 2014-09-05 DIAGNOSIS — M25642 Stiffness of left hand, not elsewhere classified: Secondary | ICD-10-CM | POA: Diagnosis not present

## 2014-09-05 DIAGNOSIS — S129XXS Fracture of neck, unspecified, sequela: Secondary | ICD-10-CM | POA: Diagnosis not present

## 2014-09-05 DIAGNOSIS — M25512 Pain in left shoulder: Secondary | ICD-10-CM | POA: Diagnosis not present

## 2014-09-05 DIAGNOSIS — S14109D Unspecified injury at unspecified level of cervical spinal cord, subsequent encounter: Secondary | ICD-10-CM | POA: Diagnosis not present

## 2014-09-05 DIAGNOSIS — G8250 Quadriplegia, unspecified: Secondary | ICD-10-CM

## 2014-09-05 DIAGNOSIS — R279 Unspecified lack of coordination: Secondary | ICD-10-CM | POA: Diagnosis not present

## 2014-09-05 DIAGNOSIS — M25612 Stiffness of left shoulder, not elsewhere classified: Secondary | ICD-10-CM | POA: Diagnosis not present

## 2014-09-05 DIAGNOSIS — R29898 Other symptoms and signs involving the musculoskeletal system: Secondary | ICD-10-CM | POA: Diagnosis not present

## 2014-09-05 DIAGNOSIS — M7582 Other shoulder lesions, left shoulder: Secondary | ICD-10-CM | POA: Diagnosis not present
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
## 2014-09-05 NOTE — Telephone Encounter (Signed)
Order written. In the future, please send any correspondences about any patients in the form of telephone call.  Thank you

## 2014-09-11 ENCOUNTER — Encounter (HOSPITAL_COMMUNITY): Payer: Self-pay

## 2014-09-11 ENCOUNTER — Ambulatory Visit (HOSPITAL_COMMUNITY): Payer: Medicare Other

## 2014-09-11 DIAGNOSIS — M25512 Pain in left shoulder: Secondary | ICD-10-CM

## 2014-09-11 DIAGNOSIS — S14109D Unspecified injury at unspecified level of cervical spinal cord, subsequent encounter: Secondary | ICD-10-CM | POA: Diagnosis not present

## 2014-09-11 DIAGNOSIS — M25612 Stiffness of left shoulder, not elsewhere classified: Secondary | ICD-10-CM | POA: Diagnosis not present

## 2014-09-11 DIAGNOSIS — M6281 Muscle weakness (generalized): Secondary | ICD-10-CM

## 2014-09-11 DIAGNOSIS — M25642 Stiffness of left hand, not elsewhere classified: Secondary | ICD-10-CM | POA: Diagnosis not present

## 2014-09-11 DIAGNOSIS — S129XXS Fracture of neck, unspecified, sequela: Secondary | ICD-10-CM | POA: Diagnosis not present

## 2014-09-11 DIAGNOSIS — R279 Unspecified lack of coordination: Secondary | ICD-10-CM | POA: Diagnosis not present

## 2014-09-11 DIAGNOSIS — M7582 Other shoulder lesions, left shoulder: Secondary | ICD-10-CM | POA: Diagnosis not present

## 2014-09-11 DIAGNOSIS — R29898 Other symptoms and signs involving the musculoskeletal system: Secondary | ICD-10-CM | POA: Diagnosis not present

## 2014-09-11 NOTE — Patient Instructions (Signed)
Posterior Capsule Stretch   Stand or sit, one arm across body so hand rests over opposite shoulder. Gently push on crossed elbow with other hand until stretch is felt in shoulder of crossed arm. Hold _10__ seconds.  Repeat _3__ times per session.
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
## 2014-09-14 ENCOUNTER — Encounter (HOSPITAL_COMMUNITY): Payer: Self-pay

## 2014-09-14 ENCOUNTER — Ambulatory Visit (HOSPITAL_COMMUNITY): Payer: Medicare Other

## 2014-09-14 DIAGNOSIS — M25612 Stiffness of left shoulder, not elsewhere classified: Secondary | ICD-10-CM

## 2014-09-14 DIAGNOSIS — M25512 Pain in left shoulder: Secondary | ICD-10-CM | POA: Diagnosis not present

## 2014-09-14 DIAGNOSIS — S129XXS Fracture of neck, unspecified, sequela: Secondary | ICD-10-CM | POA: Diagnosis not present

## 2014-09-14 DIAGNOSIS — M6281 Muscle weakness (generalized): Secondary | ICD-10-CM | POA: Diagnosis not present

## 2014-09-14 DIAGNOSIS — R279 Unspecified lack of coordination: Secondary | ICD-10-CM | POA: Diagnosis not present

## 2014-09-14 DIAGNOSIS — M7582 Other shoulder lesions, left shoulder: Secondary | ICD-10-CM | POA: Diagnosis not present

## 2014-09-14 DIAGNOSIS — R29898 Other symptoms and signs involving the musculoskeletal system: Secondary | ICD-10-CM | POA: Diagnosis not present

## 2014-09-14 DIAGNOSIS — S14109D Unspecified injury at unspecified level of cervical spinal cord, subsequent encounter: Secondary | ICD-10-CM | POA: Diagnosis not present

## 2014-09-14 DIAGNOSIS — M25642 Stiffness of left hand, not elsewhere classified: Secondary | ICD-10-CM | POA: Diagnosis not present
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
## 2014-09-19 ENCOUNTER — Encounter (HOSPITAL_COMMUNITY): Payer: Self-pay

## 2014-09-19 ENCOUNTER — Ambulatory Visit (HOSPITAL_COMMUNITY): Payer: Medicare Other

## 2014-09-19 DIAGNOSIS — M6281 Muscle weakness (generalized): Secondary | ICD-10-CM

## 2014-09-19 DIAGNOSIS — M7582 Other shoulder lesions, left shoulder: Secondary | ICD-10-CM | POA: Diagnosis not present

## 2014-09-19 DIAGNOSIS — S129XXS Fracture of neck, unspecified, sequela: Secondary | ICD-10-CM | POA: Diagnosis not present

## 2014-09-19 DIAGNOSIS — S14109D Unspecified injury at unspecified level of cervical spinal cord, subsequent encounter: Secondary | ICD-10-CM | POA: Diagnosis not present

## 2014-09-19 DIAGNOSIS — M25612 Stiffness of left shoulder, not elsewhere classified: Secondary | ICD-10-CM

## 2014-09-19 DIAGNOSIS — M25642 Stiffness of left hand, not elsewhere classified: Secondary | ICD-10-CM | POA: Diagnosis not present

## 2014-09-19 DIAGNOSIS — R29898 Other symptoms and signs involving the musculoskeletal system: Secondary | ICD-10-CM | POA: Diagnosis not present

## 2014-09-19 DIAGNOSIS — M25512 Pain in left shoulder: Secondary | ICD-10-CM | POA: Diagnosis not present

## 2014-09-19 DIAGNOSIS — R279 Unspecified lack of coordination: Secondary | ICD-10-CM | POA: Diagnosis not present
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
## 2014-09-21 ENCOUNTER — Encounter (HOSPITAL_COMMUNITY): Payer: Self-pay

## 2014-09-21 ENCOUNTER — Ambulatory Visit (HOSPITAL_COMMUNITY): Payer: Medicare Other

## 2014-09-21 DIAGNOSIS — M7582 Other shoulder lesions, left shoulder: Secondary | ICD-10-CM | POA: Diagnosis not present

## 2014-09-21 DIAGNOSIS — M6281 Muscle weakness (generalized): Secondary | ICD-10-CM | POA: Diagnosis not present

## 2014-09-21 DIAGNOSIS — M25612 Stiffness of left shoulder, not elsewhere classified: Secondary | ICD-10-CM | POA: Diagnosis not present

## 2014-09-21 DIAGNOSIS — R29898 Other symptoms and signs involving the musculoskeletal system: Secondary | ICD-10-CM | POA: Diagnosis not present

## 2014-09-21 DIAGNOSIS — R279 Unspecified lack of coordination: Secondary | ICD-10-CM | POA: Diagnosis not present

## 2014-09-21 DIAGNOSIS — M25642 Stiffness of left hand, not elsewhere classified: Secondary | ICD-10-CM | POA: Diagnosis not present

## 2014-09-21 DIAGNOSIS — S129XXS Fracture of neck, unspecified, sequela: Secondary | ICD-10-CM | POA: Diagnosis not present

## 2014-09-21 DIAGNOSIS — S14109D Unspecified injury at unspecified level of cervical spinal cord, subsequent encounter: Secondary | ICD-10-CM | POA: Diagnosis not present

## 2014-09-21 DIAGNOSIS — M25512 Pain in left shoulder: Secondary | ICD-10-CM | POA: Diagnosis not present
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
## 2014-09-26 ENCOUNTER — Encounter (HOSPITAL_COMMUNITY): Payer: Self-pay

## 2014-09-26 ENCOUNTER — Ambulatory Visit (HOSPITAL_COMMUNITY): Payer: Medicare Other | Attending: Pulmonary Disease

## 2014-09-26 DIAGNOSIS — M6281 Muscle weakness (generalized): Secondary | ICD-10-CM | POA: Diagnosis not present

## 2014-09-26 DIAGNOSIS — M25512 Pain in left shoulder: Secondary | ICD-10-CM | POA: Diagnosis not present

## 2014-09-26 DIAGNOSIS — M25612 Stiffness of left shoulder, not elsewhere classified: Secondary | ICD-10-CM | POA: Insufficient documentation

## 2014-09-26 DIAGNOSIS — M7582 Other shoulder lesions, left shoulder: Secondary | ICD-10-CM | POA: Insufficient documentation
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
## 2014-09-28 ENCOUNTER — Ambulatory Visit (HOSPITAL_COMMUNITY): Payer: Medicare Other

## 2014-09-28 ENCOUNTER — Encounter (HOSPITAL_COMMUNITY): Payer: Self-pay

## 2014-09-28 DIAGNOSIS — M25612 Stiffness of left shoulder, not elsewhere classified: Secondary | ICD-10-CM

## 2014-09-28 DIAGNOSIS — M6281 Muscle weakness (generalized): Secondary | ICD-10-CM

## 2014-09-28 DIAGNOSIS — M7582 Other shoulder lesions, left shoulder: Secondary | ICD-10-CM | POA: Diagnosis not present

## 2014-09-28 DIAGNOSIS — M25512 Pain in left shoulder: Secondary | ICD-10-CM | POA: Diagnosis not present
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
## 2014-10-01 DIAGNOSIS — E119 Type 2 diabetes mellitus without complications: Secondary | ICD-10-CM | POA: Diagnosis not present

## 2014-10-01 DIAGNOSIS — L089 Local infection of the skin and subcutaneous tissue, unspecified: Secondary | ICD-10-CM | POA: Diagnosis not present

## 2014-10-01 DIAGNOSIS — B351 Tinea unguium: Secondary | ICD-10-CM | POA: Diagnosis not present

## 2014-10-02 ENCOUNTER — Encounter (HOSPITAL_COMMUNITY): Payer: Self-pay | Admitting: Occupational Therapy

## 2014-10-02 ENCOUNTER — Ambulatory Visit (HOSPITAL_COMMUNITY): Payer: Medicare Other | Admitting: Occupational Therapy

## 2014-10-02 DIAGNOSIS — M25612 Stiffness of left shoulder, not elsewhere classified: Secondary | ICD-10-CM | POA: Diagnosis not present

## 2014-10-02 DIAGNOSIS — M25512 Pain in left shoulder: Secondary | ICD-10-CM | POA: Diagnosis not present

## 2014-10-02 DIAGNOSIS — M6281 Muscle weakness (generalized): Secondary | ICD-10-CM | POA: Diagnosis not present

## 2014-10-02 DIAGNOSIS — M7582 Other shoulder lesions, left shoulder: Secondary | ICD-10-CM | POA: Diagnosis not present
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
## 2014-10-10 ENCOUNTER — Ambulatory Visit (HOSPITAL_COMMUNITY): Payer: Medicare Other

## 2014-10-10 DIAGNOSIS — M6281 Muscle weakness (generalized): Secondary | ICD-10-CM | POA: Diagnosis not present

## 2014-10-10 DIAGNOSIS — M25512 Pain in left shoulder: Secondary | ICD-10-CM | POA: Diagnosis not present

## 2014-10-10 DIAGNOSIS — M7582 Other shoulder lesions, left shoulder: Secondary | ICD-10-CM | POA: Diagnosis not present

## 2014-10-10 DIAGNOSIS — M25612 Stiffness of left shoulder, not elsewhere classified: Secondary | ICD-10-CM | POA: Diagnosis not present
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
## 2014-10-12 ENCOUNTER — Ambulatory Visit (HOSPITAL_COMMUNITY): Payer: Medicare Other

## 2014-10-12 DIAGNOSIS — M7582 Other shoulder lesions, left shoulder: Secondary | ICD-10-CM | POA: Diagnosis not present

## 2014-10-12 DIAGNOSIS — M25612 Stiffness of left shoulder, not elsewhere classified: Secondary | ICD-10-CM | POA: Diagnosis not present

## 2014-10-12 DIAGNOSIS — M6281 Muscle weakness (generalized): Secondary | ICD-10-CM

## 2014-10-12 DIAGNOSIS — M25512 Pain in left shoulder: Secondary | ICD-10-CM | POA: Diagnosis not present

## 2014-10-12 NOTE — Patient Instructions (Signed)
Wrist stretches  PRAYER STRETCH - WRIST  Place the palms of your hands together to stretch the wrist as shown.  Hold 1- seconds. Repeat 3 times.    wrist extension/prayer stretch  rest your elbows on a table with your hands together. make sure during the exercise to keep your palms together at all times.  now slowly slide your elbows out until you feel a good stretch.  hold for 10 seconds, repeat 3 times, 3 times a day     WRIST EXTENSION STRETCH - TABLE  Place boths hand on a table as shown and gently lean forward until a stretch is felt.  Hold for 10 seconds. Repeat 3 times.
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
## 2014-10-15 ENCOUNTER — Encounter: Payer: Medicare Other | Attending: Physical Medicine & Rehabilitation | Admitting: Physical Medicine & Rehabilitation

## 2014-10-15 ENCOUNTER — Encounter: Payer: Self-pay | Admitting: Physical Medicine & Rehabilitation

## 2014-10-15 VITALS — BP 162/70 | HR 77 | Resp 14

## 2014-10-15 DIAGNOSIS — M25512 Pain in left shoulder: Secondary | ICD-10-CM | POA: Diagnosis not present

## 2014-10-15 DIAGNOSIS — S129XXS Fracture of neck, unspecified, sequela: Secondary | ICD-10-CM

## 2014-10-15 DIAGNOSIS — S14109S Unspecified injury at unspecified level of cervical spinal cord, sequela: Secondary | ICD-10-CM

## 2014-10-15 DIAGNOSIS — S12200S Unspecified displaced fracture of third cervical vertebra, sequela: Secondary | ICD-10-CM | POA: Diagnosis not present

## 2014-10-15 DIAGNOSIS — S12090S Other displaced fracture of first cervical vertebra, sequela: Secondary | ICD-10-CM | POA: Diagnosis not present

## 2014-10-15 DIAGNOSIS — K592 Neurogenic bowel, not elsewhere classified: Secondary | ICD-10-CM | POA: Diagnosis not present

## 2014-10-15 DIAGNOSIS — N319 Neuromuscular dysfunction of bladder, unspecified: Secondary | ICD-10-CM | POA: Insufficient documentation

## 2014-10-15 DIAGNOSIS — M7502 Adhesive capsulitis of left shoulder: Secondary | ICD-10-CM | POA: Diagnosis not present

## 2014-10-15 DIAGNOSIS — E1140 Type 2 diabetes mellitus with diabetic neuropathy, unspecified: Secondary | ICD-10-CM | POA: Insufficient documentation

## 2014-10-15 DIAGNOSIS — S14159S Other incomplete lesion at unspecified level of cervical spinal cord, sequela: Secondary | ICD-10-CM | POA: Insufficient documentation

## 2014-10-15 DIAGNOSIS — G8250 Quadriplegia, unspecified: Secondary | ICD-10-CM

## 2014-10-15 NOTE — Progress Notes (Signed)
Subjective:    Patient ID: [name], [name], male    DOB: August 27, 1945, 69 y.o.   MRN: [id number]  HPI  [name] is here in follow up of his cervical SCI and associated tetraplegia. He has had great results with botox and therapy. He has seen increases in his rom and every day use of his LUE. He is using it more for every day activities. He can reach and scratch his left ear. He is able to help with don/doffing of his pants amongst other activities. He is working with OT 2-3 x per week currently. He takes his baclofen generally at night as the day time dosing makes him sleepy.   Pain Inventory Average Pain 3 Pain Right Now 2 My pain is stiffness  In the last 24 hours, has pain interfered with the following? General activity 0 Relation with others 0 Enjoyment of life 7 What TIME of day is your pain at its worst? daytime, night Sleep (in general) Fair  Pain is worse with: some activites Pain improves with: medication Relief from Meds: 7  Mobility walk with assistance use a cane ability to climb steps?  yes do you drive?  yes transfers alone  Function not employed: date last employed . I need assistance with the following:  dressing and meal prep  Neuro/Psych No problems in this area  Prior Studies Any changes since last visit?  no  Physicians involved in your care Any changes since last visit?  no   No family history on file. Social History   Social History  . Marital Status: Single    Spouse Name: N/A  . Number of Children: N/A  . Years of Education: N/A   Social History Main Topics  . Smoking status: Never Smoker   . Smokeless tobacco: None  . Alcohol Use: No  . Drug Use: No  . Sexual Activity: Not Asked   Other Topics Concern  . None   Social History Narrative   History reviewed. No pertinent past surgical history. Past Medical History  Diagnosis Date  . Diabetes mellitus without complication   . Hypertension   . Arthritis   . Cataract    BP  162/70 mmHg  Pulse 77  Resp 14  SpO2 100%  Opioid Risk Score:   Fall Risk Score:  `1  Depression screen PHQ 2/9  Depression screen PHQ 2/9 05/09/2014  Decreased Interest 0  Down, Depressed, Hopeless 0  PHQ - 2 Score 0  Altered sleeping 0  Tired, decreased energy 1  Change in appetite 0  Feeling bad or failure about yourself  1  Trouble concentrating 0  Moving slowly or fidgety/restless 0  Suicidal thoughts 0  PHQ-9 Score 2     Review of Systems  All other systems reviewed and are negative.      Objective:   Physical Exam  HEENT:wound looks great, granulating, drying up  -  Cardio: RRR and no murmur  Resp: CTA B/L and unlabored  GI: BS positive and NT,ND  Extremity: Pulses positive. mild Edema LUE tr to 1+  Skin: Intact. Scalp wound healed--scarred Neuro: Flat, Abnormal Sensory pt reports equal LT and proprio BLE , Abnormal Motor 4+ R delt, bi, tri, grip,HF, KE ADF 4+. Left: 4- delt bi, tri, grip, and Left HF, KE, ADF are 4- as well.  Musc/Skel: . Left shoulder PROM is near 90 degrees abduction but minimal pain. Left pec major,minor tightness better, trace to 1/4. Has tightness in the left pronator  teres 1/4,--may be tighter in the pronator quadratus (2/4)  finger flexors trace. May have 1 to trace/4 tone in left teres major/minor and infrapinatus- walking with cane. Stands easily with good balance.   uses cane effectively for support. DTR's 3+ left more than right . Gen NAD.  Psych: mood pleasant and appropriate   Assessment/Plan:  1. Functional deficits secondary to C-spine fractures ant arch C1,as well as C3 spinous fracture with INCOMPLETE SCI and spastic tetraplegia -outpt OT to continue---he's making nice gains and responding to botox as well 3. Pain Management: prn hydrocodone, naproxen  -pain minimal at this point  4. Diabetes mellitus with peripheral neuropathy.  5. Adhesive capsulitis left shoulder--- slow, gradual improvement, pain better   -continue with aggressive ROM, therapy -discussed surgical options today 6. Spastic tetraplegia: -will pursue botox again, 300 units to left pronator teres and quadratus -hep to continue also  7. Neurogenic bowel and bladder---improved Follow up in about 1 month for botox. Fifteen minutes of face to face patient care time were spent during this visit. All questions were encouraged and answered.

## 2014-10-15 NOTE — Patient Instructions (Signed)
PLEASE CALL ME WITH ANY PROBLEMS OR QUESTIONS (#[phone]).  HAVE A GOOD DAY!    

## 2014-10-17 ENCOUNTER — Ambulatory Visit (HOSPITAL_COMMUNITY): Payer: Medicare Other | Admitting: Occupational Therapy

## 2014-10-17 ENCOUNTER — Encounter (HOSPITAL_COMMUNITY): Payer: Self-pay | Admitting: Occupational Therapy

## 2014-10-17 DIAGNOSIS — M25612 Stiffness of left shoulder, not elsewhere classified: Secondary | ICD-10-CM

## 2014-10-17 DIAGNOSIS — M6281 Muscle weakness (generalized): Secondary | ICD-10-CM | POA: Diagnosis not present

## 2014-10-17 DIAGNOSIS — M25512 Pain in left shoulder: Secondary | ICD-10-CM

## 2014-10-17 DIAGNOSIS — M7582 Other shoulder lesions, left shoulder: Secondary | ICD-10-CM | POA: Diagnosis not present
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
## 2014-10-19 ENCOUNTER — Ambulatory Visit (HOSPITAL_COMMUNITY): Payer: Medicare Other | Admitting: Occupational Therapy

## 2014-10-19 ENCOUNTER — Telehealth (HOSPITAL_COMMUNITY): Payer: Self-pay | Admitting: Occupational Therapy

## 2014-10-19 NOTE — Telephone Encounter (Signed)
Wife called to say he would not be in today

## 2014-10-24 ENCOUNTER — Encounter (HOSPITAL_COMMUNITY): Payer: Self-pay | Admitting: Occupational Therapy

## 2014-10-24 ENCOUNTER — Ambulatory Visit (HOSPITAL_COMMUNITY): Payer: Medicare Other | Admitting: Occupational Therapy

## 2014-10-24 DIAGNOSIS — M6281 Muscle weakness (generalized): Secondary | ICD-10-CM | POA: Diagnosis not present

## 2014-10-24 DIAGNOSIS — M25612 Stiffness of left shoulder, not elsewhere classified: Secondary | ICD-10-CM | POA: Diagnosis not present

## 2014-10-24 DIAGNOSIS — M7582 Other shoulder lesions, left shoulder: Secondary | ICD-10-CM | POA: Diagnosis not present

## 2014-10-24 DIAGNOSIS — M25512 Pain in left shoulder: Secondary | ICD-10-CM | POA: Diagnosis not present
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
## 2014-10-25 DIAGNOSIS — S14121D Central cord syndrome at C1 level of cervical spinal cord, subsequent encounter: Secondary | ICD-10-CM | POA: Diagnosis not present

## 2014-10-25 DIAGNOSIS — Z6830 Body mass index (BMI) 30.0-30.9, adult: Secondary | ICD-10-CM | POA: Diagnosis not present

## 2014-10-26 ENCOUNTER — Encounter (HOSPITAL_COMMUNITY): Payer: Self-pay

## 2014-10-26 ENCOUNTER — Ambulatory Visit (HOSPITAL_COMMUNITY): Payer: Medicare Other | Attending: Pulmonary Disease

## 2014-10-26 DIAGNOSIS — M7582 Other shoulder lesions, left shoulder: Secondary | ICD-10-CM | POA: Diagnosis not present

## 2014-10-26 DIAGNOSIS — M25642 Stiffness of left hand, not elsewhere classified: Secondary | ICD-10-CM | POA: Insufficient documentation

## 2014-10-26 DIAGNOSIS — M6281 Muscle weakness (generalized): Secondary | ICD-10-CM | POA: Diagnosis not present

## 2014-10-26 DIAGNOSIS — R279 Unspecified lack of coordination: Secondary | ICD-10-CM | POA: Insufficient documentation

## 2014-10-26 DIAGNOSIS — M25612 Stiffness of left shoulder, not elsewhere classified: Secondary | ICD-10-CM
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
## 2014-10-31 ENCOUNTER — Encounter (HOSPITAL_COMMUNITY): Payer: Self-pay

## 2014-10-31 ENCOUNTER — Ambulatory Visit (HOSPITAL_COMMUNITY): Payer: Medicare Other

## 2014-10-31 DIAGNOSIS — R29898 Other symptoms and signs involving the musculoskeletal system: Secondary | ICD-10-CM

## 2014-10-31 DIAGNOSIS — M25612 Stiffness of left shoulder, not elsewhere classified: Secondary | ICD-10-CM

## 2014-10-31 DIAGNOSIS — M6281 Muscle weakness (generalized): Secondary | ICD-10-CM

## 2014-10-31 DIAGNOSIS — R279 Unspecified lack of coordination: Secondary | ICD-10-CM | POA: Diagnosis not present

## 2014-10-31 DIAGNOSIS — M25642 Stiffness of left hand, not elsewhere classified: Secondary | ICD-10-CM | POA: Diagnosis not present

## 2014-10-31 DIAGNOSIS — M7582 Other shoulder lesions, left shoulder: Secondary | ICD-10-CM | POA: Diagnosis not present
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
## 2014-11-02 ENCOUNTER — Encounter (HOSPITAL_COMMUNITY): Payer: Self-pay

## 2014-11-02 ENCOUNTER — Ambulatory Visit (HOSPITAL_COMMUNITY): Payer: Medicare Other

## 2014-11-02 DIAGNOSIS — M7582 Other shoulder lesions, left shoulder: Secondary | ICD-10-CM | POA: Diagnosis not present

## 2014-11-02 DIAGNOSIS — M6281 Muscle weakness (generalized): Secondary | ICD-10-CM | POA: Diagnosis not present

## 2014-11-02 DIAGNOSIS — M25642 Stiffness of left hand, not elsewhere classified: Secondary | ICD-10-CM | POA: Diagnosis not present

## 2014-11-02 DIAGNOSIS — M25612 Stiffness of left shoulder, not elsewhere classified: Secondary | ICD-10-CM | POA: Diagnosis not present

## 2014-11-02 DIAGNOSIS — R279 Unspecified lack of coordination: Secondary | ICD-10-CM | POA: Diagnosis not present

## 2014-11-02 NOTE — Patient Instructions (Signed)
   4) Internal & External Rotation    Stand with elbows at the side and elbows bent 90 degrees. Move your forearms away from your body, then bring back inward toward the body.  Repeat _10__times, ___sets/day  External Rotation - Theraband  With one end of the theraband secured at waist height, keeping the elbow tucked in at your side, rotate hand away from side. Repeat 10 times.    (Home) Extension: Isometric / Bilateral Arm Retraction - Sitting   Facing anchor, hold hands and elbow at shoulder height, with elbow bent.  Pull arms back to squeeze shoulder blades together. Repeat 10-15 times.  Copyright  VHI. All rights reserved.   (Home) Retraction: Row - Bilateral (Anchor)   Facing anchor, arms reaching forward, pull hands toward stomach, keeping elbows bent and at your sides and pinching shoulder blades together. Repeat 10-15 times.  Copyright  VHI. All rights reserved.   (Clinic) Extension / Flexion (Assist)   Face anchor, pull arms back, keeping elbow straight, and squeze shoulder blades together. Repeat 10-15 times.   Copyright  VHI. All rights reserved.
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
## 2014-11-07 ENCOUNTER — Ambulatory Visit (HOSPITAL_COMMUNITY): Payer: Medicare Other

## 2014-11-07 DIAGNOSIS — M6281 Muscle weakness (generalized): Secondary | ICD-10-CM

## 2014-11-07 DIAGNOSIS — R279 Unspecified lack of coordination: Secondary | ICD-10-CM | POA: Diagnosis not present

## 2014-11-07 DIAGNOSIS — M25642 Stiffness of left hand, not elsewhere classified: Secondary | ICD-10-CM | POA: Diagnosis not present

## 2014-11-07 DIAGNOSIS — M25612 Stiffness of left shoulder, not elsewhere classified: Secondary | ICD-10-CM | POA: Diagnosis not present

## 2014-11-07 DIAGNOSIS — M7582 Other shoulder lesions, left shoulder: Secondary | ICD-10-CM | POA: Diagnosis not present

## 2014-11-07 DIAGNOSIS — R29898 Other symptoms and signs involving the musculoskeletal system: Secondary | ICD-10-CM
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
## 2014-11-09 ENCOUNTER — Encounter (HOSPITAL_COMMUNITY): Payer: Self-pay

## 2014-11-09 ENCOUNTER — Ambulatory Visit (HOSPITAL_COMMUNITY): Payer: Medicare Other

## 2014-11-09 DIAGNOSIS — M25612 Stiffness of left shoulder, not elsewhere classified: Secondary | ICD-10-CM | POA: Diagnosis not present

## 2014-11-09 DIAGNOSIS — R29898 Other symptoms and signs involving the musculoskeletal system: Secondary | ICD-10-CM

## 2014-11-09 DIAGNOSIS — R279 Unspecified lack of coordination: Secondary | ICD-10-CM | POA: Diagnosis not present

## 2014-11-09 DIAGNOSIS — M7582 Other shoulder lesions, left shoulder: Secondary | ICD-10-CM | POA: Diagnosis not present

## 2014-11-09 DIAGNOSIS — M25642 Stiffness of left hand, not elsewhere classified: Secondary | ICD-10-CM | POA: Diagnosis not present

## 2014-11-09 DIAGNOSIS — M6281 Muscle weakness (generalized): Secondary | ICD-10-CM | POA: Diagnosis not present
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
## 2014-11-12 ENCOUNTER — Ambulatory Visit (HOSPITAL_COMMUNITY): Payer: Medicare Other

## 2014-11-12 DIAGNOSIS — M25642 Stiffness of left hand, not elsewhere classified: Secondary | ICD-10-CM | POA: Diagnosis not present

## 2014-11-12 DIAGNOSIS — R279 Unspecified lack of coordination: Secondary | ICD-10-CM

## 2014-11-12 DIAGNOSIS — M6281 Muscle weakness (generalized): Secondary | ICD-10-CM | POA: Diagnosis not present

## 2014-11-12 DIAGNOSIS — M7582 Other shoulder lesions, left shoulder: Secondary | ICD-10-CM | POA: Diagnosis not present

## 2014-11-12 DIAGNOSIS — M25612 Stiffness of left shoulder, not elsewhere classified: Secondary | ICD-10-CM | POA: Diagnosis not present

## 2014-11-12 DIAGNOSIS — R29898 Other symptoms and signs involving the musculoskeletal system: Secondary | ICD-10-CM
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
## 2014-11-16 ENCOUNTER — Ambulatory Visit (HOSPITAL_COMMUNITY): Payer: Medicare Other | Admitting: Occupational Therapy

## 2014-11-16 ENCOUNTER — Encounter (HOSPITAL_COMMUNITY): Payer: Self-pay | Admitting: Occupational Therapy

## 2014-11-16 DIAGNOSIS — M25642 Stiffness of left hand, not elsewhere classified: Secondary | ICD-10-CM | POA: Diagnosis not present

## 2014-11-16 DIAGNOSIS — M7582 Other shoulder lesions, left shoulder: Secondary | ICD-10-CM | POA: Diagnosis not present

## 2014-11-16 DIAGNOSIS — M6281 Muscle weakness (generalized): Secondary | ICD-10-CM | POA: Diagnosis not present

## 2014-11-16 DIAGNOSIS — R29898 Other symptoms and signs involving the musculoskeletal system: Secondary | ICD-10-CM

## 2014-11-16 DIAGNOSIS — M25612 Stiffness of left shoulder, not elsewhere classified: Secondary | ICD-10-CM

## 2014-11-16 DIAGNOSIS — R279 Unspecified lack of coordination: Secondary | ICD-10-CM
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
## 2014-11-19 ENCOUNTER — Encounter (HOSPITAL_COMMUNITY): Payer: Self-pay

## 2014-11-19 ENCOUNTER — Encounter (HOSPITAL_COMMUNITY): Payer: Self-pay | Admitting: Occupational Therapy

## 2014-11-19 ENCOUNTER — Ambulatory Visit (HOSPITAL_COMMUNITY): Payer: Medicare Other

## 2014-11-19 DIAGNOSIS — M25642 Stiffness of left hand, not elsewhere classified: Secondary | ICD-10-CM

## 2014-11-19 DIAGNOSIS — R279 Unspecified lack of coordination: Secondary | ICD-10-CM

## 2014-11-19 DIAGNOSIS — M25612 Stiffness of left shoulder, not elsewhere classified: Secondary | ICD-10-CM

## 2014-11-19 DIAGNOSIS — R29898 Other symptoms and signs involving the musculoskeletal system: Secondary | ICD-10-CM

## 2014-11-19 DIAGNOSIS — M7582 Other shoulder lesions, left shoulder: Secondary | ICD-10-CM | POA: Diagnosis not present

## 2014-11-19 DIAGNOSIS — M6281 Muscle weakness (generalized): Secondary | ICD-10-CM | POA: Diagnosis not present
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
## 2014-11-22 ENCOUNTER — Ambulatory Visit (HOSPITAL_COMMUNITY): Payer: Medicare Other | Admitting: Occupational Therapy

## 2014-11-26 ENCOUNTER — Encounter: Payer: Self-pay | Admitting: Physical Medicine & Rehabilitation

## 2014-11-26 ENCOUNTER — Encounter: Payer: Medicare Other | Attending: Physical Medicine & Rehabilitation | Admitting: Physical Medicine & Rehabilitation

## 2014-11-26 VITALS — BP 155/60 | HR 80

## 2014-11-26 DIAGNOSIS — M7502 Adhesive capsulitis of left shoulder: Secondary | ICD-10-CM | POA: Insufficient documentation

## 2014-11-26 DIAGNOSIS — K592 Neurogenic bowel, not elsewhere classified: Secondary | ICD-10-CM | POA: Insufficient documentation

## 2014-11-26 DIAGNOSIS — N319 Neuromuscular dysfunction of bladder, unspecified: Secondary | ICD-10-CM | POA: Diagnosis not present

## 2014-11-26 DIAGNOSIS — E1140 Type 2 diabetes mellitus with diabetic neuropathy, unspecified: Secondary | ICD-10-CM | POA: Diagnosis not present

## 2014-11-26 DIAGNOSIS — M25512 Pain in left shoulder: Secondary | ICD-10-CM | POA: Diagnosis not present

## 2014-11-26 DIAGNOSIS — S12090S Other displaced fracture of first cervical vertebra, sequela: Secondary | ICD-10-CM | POA: Insufficient documentation

## 2014-11-26 DIAGNOSIS — G8250 Quadriplegia, unspecified: Secondary | ICD-10-CM | POA: Diagnosis not present

## 2014-11-26 DIAGNOSIS — S12200S Unspecified displaced fracture of third cervical vertebra, sequela: Secondary | ICD-10-CM | POA: Insufficient documentation

## 2014-11-26 DIAGNOSIS — S14159S Other incomplete lesion at unspecified level of cervical spinal cord, sequela: Secondary | ICD-10-CM | POA: Diagnosis not present

## 2014-11-26 NOTE — Progress Notes (Signed)
Botox Injection for spasticity using needle EMG guidance Indication: spastic hemiparesis non-dominant limb (LUE)  Dilution: 100 Units/ml        Total Units Injected: 300 Indication: Severe spasticity which interferes with ADL,mobility and/or  hygiene and is unresponsive to medication management and other conservative care Informed consent was obtained after describing risks and benefits of the procedure with the patient. This includes bleeding, bruising, infection, excessive weakness, or medication side effects. A REMS form is on file and signed.  Needle: 50mm injectable monopolar needle electrode  Number of units per muscle Pronator Teres 175 units with 4 separate access points Pronator Quadratus 125 units with 4 separate access points  All injections were done after obtaining appropriate EMG activity and after negative drawback for blood. The patient tolerated the procedure well. Post procedure instructions were given. A followup appointment was made.

## 2014-11-26 NOTE — Patient Instructions (Signed)
PLEASE CALL ME WITH ANY PROBLEMS OR QUESTIONS (#[phone]).  HAVE A GOOD DAY!    

## 2014-11-28 ENCOUNTER — Ambulatory Visit (INDEPENDENT_AMBULATORY_CARE_PROVIDER_SITE_OTHER): Payer: Medicare Other | Admitting: Urology

## 2014-11-28 DIAGNOSIS — N401 Enlarged prostate with lower urinary tract symptoms: Secondary | ICD-10-CM

## 2014-11-28 DIAGNOSIS — N3281 Overactive bladder: Secondary | ICD-10-CM

## 2014-11-28 DIAGNOSIS — R351 Nocturia: Secondary | ICD-10-CM

## 2014-12-10 ENCOUNTER — Ambulatory Visit (HOSPITAL_COMMUNITY): Payer: Medicare Other | Attending: Pulmonary Disease | Admitting: Specialist

## 2014-12-10 ENCOUNTER — Telehealth (HOSPITAL_COMMUNITY): Payer: Self-pay | Admitting: Specialist

## 2014-12-10 DIAGNOSIS — M25642 Stiffness of left hand, not elsewhere classified: Secondary | ICD-10-CM | POA: Insufficient documentation

## 2014-12-10 DIAGNOSIS — M25612 Stiffness of left shoulder, not elsewhere classified: Secondary | ICD-10-CM | POA: Insufficient documentation

## 2014-12-10 DIAGNOSIS — M6281 Muscle weakness (generalized): Secondary | ICD-10-CM | POA: Insufficient documentation

## 2014-12-10 DIAGNOSIS — R279 Unspecified lack of coordination: Secondary | ICD-10-CM | POA: Insufficient documentation

## 2014-12-10 NOTE — Telephone Encounter (Signed)
Patient did not attend scheduled appointment.  I attempted to call [name] however the line was busy. Shirlean MylarBethany H. Meryl Hubers, OTR/L [phone]

## 2014-12-13 ENCOUNTER — Ambulatory Visit (HOSPITAL_COMMUNITY): Payer: Medicare Other

## 2014-12-13 ENCOUNTER — Encounter (HOSPITAL_COMMUNITY): Payer: Self-pay

## 2014-12-13 DIAGNOSIS — M25612 Stiffness of left shoulder, not elsewhere classified: Secondary | ICD-10-CM

## 2014-12-13 DIAGNOSIS — R279 Unspecified lack of coordination: Secondary | ICD-10-CM | POA: Diagnosis not present

## 2014-12-13 DIAGNOSIS — M6281 Muscle weakness (generalized): Secondary | ICD-10-CM | POA: Diagnosis not present

## 2014-12-13 DIAGNOSIS — M25642 Stiffness of left hand, not elsewhere classified: Secondary | ICD-10-CM | POA: Diagnosis not present
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
## 2014-12-14 ENCOUNTER — Ambulatory Visit (HOSPITAL_COMMUNITY): Payer: Medicare Other

## 2014-12-14 ENCOUNTER — Encounter (HOSPITAL_COMMUNITY): Payer: Self-pay

## 2014-12-14 DIAGNOSIS — M6281 Muscle weakness (generalized): Secondary | ICD-10-CM

## 2014-12-14 DIAGNOSIS — M25612 Stiffness of left shoulder, not elsewhere classified: Secondary | ICD-10-CM | POA: Diagnosis not present

## 2014-12-14 DIAGNOSIS — R279 Unspecified lack of coordination: Secondary | ICD-10-CM | POA: Diagnosis not present

## 2014-12-14 DIAGNOSIS — R29898 Other symptoms and signs involving the musculoskeletal system: Secondary | ICD-10-CM

## 2014-12-14 DIAGNOSIS — M25642 Stiffness of left hand, not elsewhere classified: Secondary | ICD-10-CM | POA: Diagnosis not present

## 2014-12-14 NOTE — Patient Instructions (Signed)
Wrist and Forearm exercises - Complete 15 times. 2-3X a day. Start with a 1lb. Weight and increase weight as you're able.   Wrist Supination  Supporting forearm on a table and with palm facing down, rotate the wrist to palm up position.  Maintain forearm contact with the table.     Wrist Flexion  Supporting the forearm on table top and with palm facing upward, raise the weight as far as possible bending at the wrist.  Maintain arm contact on the table.   ** Hold wrist in neutral position versus palm up.**  Wrist Extension  Supporting the arm with a table top and palm facing downward, raise the weight as far as possible by bending the wrist back.  Maintain contact with the table throughout the movement.    Forearm Supination with Band  Wrap a band around your hands as shown in the picture. Start the exercise with your palms facing down then turn your palms up as far as you are able while keeping your elbows at your sides.   Hold for 5 seconds, then slowly return palms to facing down. Relax, then repeat.
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
## 2014-12-17 ENCOUNTER — Encounter (HOSPITAL_COMMUNITY): Payer: Self-pay

## 2014-12-17 ENCOUNTER — Ambulatory Visit (HOSPITAL_COMMUNITY): Payer: Medicare Other

## 2014-12-17 DIAGNOSIS — L720 Epidermal cyst: Secondary | ICD-10-CM | POA: Diagnosis not present

## 2014-12-17 DIAGNOSIS — R29898 Other symptoms and signs involving the musculoskeletal system: Secondary | ICD-10-CM

## 2014-12-17 DIAGNOSIS — M25642 Stiffness of left hand, not elsewhere classified: Secondary | ICD-10-CM | POA: Diagnosis not present

## 2014-12-17 DIAGNOSIS — M6281 Muscle weakness (generalized): Secondary | ICD-10-CM

## 2014-12-17 DIAGNOSIS — R279 Unspecified lack of coordination: Secondary | ICD-10-CM

## 2014-12-17 DIAGNOSIS — M25612 Stiffness of left shoulder, not elsewhere classified: Secondary | ICD-10-CM | POA: Diagnosis not present
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
## 2014-12-18 ENCOUNTER — Encounter (HOSPITAL_COMMUNITY): Payer: Self-pay

## 2014-12-18 ENCOUNTER — Ambulatory Visit (HOSPITAL_COMMUNITY): Payer: Medicare Other

## 2014-12-18 DIAGNOSIS — R279 Unspecified lack of coordination: Secondary | ICD-10-CM | POA: Diagnosis not present

## 2014-12-18 DIAGNOSIS — M25612 Stiffness of left shoulder, not elsewhere classified: Secondary | ICD-10-CM | POA: Diagnosis not present

## 2014-12-18 DIAGNOSIS — M25642 Stiffness of left hand, not elsewhere classified: Secondary | ICD-10-CM

## 2014-12-18 DIAGNOSIS — M6281 Muscle weakness (generalized): Secondary | ICD-10-CM

## 2014-12-18 DIAGNOSIS — R29898 Other symptoms and signs involving the musculoskeletal system: Secondary | ICD-10-CM
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
## 2014-12-19 ENCOUNTER — Encounter (HOSPITAL_COMMUNITY): Payer: Self-pay

## 2014-12-20 ENCOUNTER — Ambulatory Visit (HOSPITAL_COMMUNITY): Payer: Medicare Other

## 2014-12-20 ENCOUNTER — Encounter (HOSPITAL_COMMUNITY): Payer: Self-pay

## 2014-12-20 DIAGNOSIS — M6281 Muscle weakness (generalized): Secondary | ICD-10-CM | POA: Diagnosis not present

## 2014-12-20 DIAGNOSIS — M25642 Stiffness of left hand, not elsewhere classified: Secondary | ICD-10-CM | POA: Diagnosis not present

## 2014-12-20 DIAGNOSIS — R279 Unspecified lack of coordination: Secondary | ICD-10-CM | POA: Diagnosis not present

## 2014-12-20 DIAGNOSIS — M25612 Stiffness of left shoulder, not elsewhere classified: Secondary | ICD-10-CM | POA: Diagnosis not present

## 2014-12-20 DIAGNOSIS — R29898 Other symptoms and signs involving the musculoskeletal system: Secondary | ICD-10-CM
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
## 2014-12-24 ENCOUNTER — Encounter (HOSPITAL_COMMUNITY): Payer: Self-pay

## 2014-12-24 ENCOUNTER — Ambulatory Visit (HOSPITAL_COMMUNITY): Payer: Medicare Other

## 2014-12-24 DIAGNOSIS — M6281 Muscle weakness (generalized): Secondary | ICD-10-CM

## 2014-12-24 DIAGNOSIS — M25612 Stiffness of left shoulder, not elsewhere classified: Secondary | ICD-10-CM | POA: Diagnosis not present

## 2014-12-24 DIAGNOSIS — R279 Unspecified lack of coordination: Secondary | ICD-10-CM

## 2014-12-24 DIAGNOSIS — R29898 Other symptoms and signs involving the musculoskeletal system: Secondary | ICD-10-CM

## 2014-12-24 DIAGNOSIS — M25642 Stiffness of left hand, not elsewhere classified: Secondary | ICD-10-CM | POA: Diagnosis not present
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
## 2014-12-25 ENCOUNTER — Ambulatory Visit (HOSPITAL_COMMUNITY): Payer: Medicare Other | Attending: Pulmonary Disease | Admitting: Occupational Therapy

## 2014-12-25 ENCOUNTER — Encounter (HOSPITAL_COMMUNITY): Payer: Self-pay | Admitting: Occupational Therapy

## 2014-12-25 DIAGNOSIS — M6281 Muscle weakness (generalized): Secondary | ICD-10-CM | POA: Diagnosis not present

## 2014-12-25 DIAGNOSIS — R29898 Other symptoms and signs involving the musculoskeletal system: Secondary | ICD-10-CM

## 2014-12-25 DIAGNOSIS — M25642 Stiffness of left hand, not elsewhere classified: Secondary | ICD-10-CM | POA: Diagnosis not present
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
## 2014-12-27 ENCOUNTER — Encounter (HOSPITAL_COMMUNITY): Payer: Self-pay | Admitting: Occupational Therapy

## 2014-12-27 ENCOUNTER — Ambulatory Visit (HOSPITAL_COMMUNITY): Payer: Medicare Other | Admitting: Occupational Therapy

## 2014-12-27 DIAGNOSIS — M6281 Muscle weakness (generalized): Secondary | ICD-10-CM | POA: Diagnosis not present

## 2014-12-27 DIAGNOSIS — R29898 Other symptoms and signs involving the musculoskeletal system: Secondary | ICD-10-CM

## 2014-12-27 DIAGNOSIS — M25642 Stiffness of left hand, not elsewhere classified: Secondary | ICD-10-CM

## 2014-12-27 NOTE — Patient Instructions (Signed)
Wrist Strengthening Exercises  1) WRIST EXTENSION CURLS - TABLE  Hold a small free weight, rest your forearm on a table and bend your wrist up and down with your palm face down as shown.      2) WRIST FLEXION CURLS - TABLE  Hold a small free weight, rest your forearm on a table and bend your wrist up and down with your palm face up as shown.     3) FREE WEIGHT RADIAL/ULNAR DEVIATION - TABLE  Hold a small free weight, rest your forearm on a table and bend your wrist up and down with your palm facing towards the side as shown.     4) Pronation  Forearm supported on table with wrist in neutral position. Using a weight, roll wrist so that palm faces downward. Hold for 2 seconds and return to starting position.     5) Supination  Forearm supported on table with wrist in neutral position. Using a weight, roll wrist so that palm is now facing upward. Hold for 2 seconds and return to starting position.      *Complete exercises using _2___ pound weight, __15__times each, __1-2__times per day*     Theraband Exercises   1) Forearm Supination With band wrapped around your left hand (palm down) and other end secured by your opposite hand rotate your forearm so that your hand moves towards the palm up.  Keep your elbow by your side and flexed at 90 degrees with your forearm supported on a pillow while doing this.        2) Wrist Extension Support your forearm on a table with your palm facing the ground. Next, stepping on one end of the theraband, curl your wrist up against resistance.      Wrist Extension Stretch Place boths hand on a table as shown and gently lean forward until a stretch is felt.
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
## 2015-01-01 ENCOUNTER — Encounter (HOSPITAL_COMMUNITY): Payer: Medicare Other

## 2015-01-03 ENCOUNTER — Encounter (HOSPITAL_COMMUNITY): Payer: Medicare Other

## 2015-01-08 ENCOUNTER — Encounter (HOSPITAL_COMMUNITY): Payer: Medicare Other

## 2015-01-09 DIAGNOSIS — Z0000 Encounter for general adult medical examination without abnormal findings: Secondary | ICD-10-CM | POA: Diagnosis not present

## 2015-01-09 DIAGNOSIS — G43909 Migraine, unspecified, not intractable, without status migrainosus: Secondary | ICD-10-CM | POA: Diagnosis not present

## 2015-01-09 DIAGNOSIS — Z125 Encounter for screening for malignant neoplasm of prostate: Secondary | ICD-10-CM | POA: Diagnosis not present

## 2015-01-09 DIAGNOSIS — I10 Essential (primary) hypertension: Secondary | ICD-10-CM | POA: Diagnosis not present

## 2015-01-09 DIAGNOSIS — E669 Obesity, unspecified: Secondary | ICD-10-CM | POA: Diagnosis not present

## 2015-01-09 DIAGNOSIS — E1165 Type 2 diabetes mellitus with hyperglycemia: Secondary | ICD-10-CM | POA: Diagnosis not present

## 2015-01-09 DIAGNOSIS — K429 Umbilical hernia without obstruction or gangrene: Secondary | ICD-10-CM | POA: Diagnosis not present

## 2015-01-10 ENCOUNTER — Encounter (HOSPITAL_COMMUNITY): Payer: Medicare Other | Admitting: Occupational Therapy

## 2015-01-28 ENCOUNTER — Encounter: Payer: Self-pay | Admitting: Physical Medicine & Rehabilitation

## 2015-01-28 ENCOUNTER — Encounter: Payer: Medicare Other | Attending: Physical Medicine & Rehabilitation | Admitting: Physical Medicine & Rehabilitation

## 2015-01-28 ENCOUNTER — Encounter: Payer: Medicare Other | Admitting: Physical Medicine & Rehabilitation

## 2015-01-28 VITALS — BP 149/67 | HR 79 | Resp 14

## 2015-01-28 DIAGNOSIS — M25512 Pain in left shoulder: Secondary | ICD-10-CM | POA: Diagnosis not present

## 2015-01-28 DIAGNOSIS — K592 Neurogenic bowel, not elsewhere classified: Secondary | ICD-10-CM | POA: Insufficient documentation

## 2015-01-28 DIAGNOSIS — G8250 Quadriplegia, unspecified: Secondary | ICD-10-CM | POA: Diagnosis not present

## 2015-01-28 DIAGNOSIS — E1140 Type 2 diabetes mellitus with diabetic neuropathy, unspecified: Secondary | ICD-10-CM | POA: Insufficient documentation

## 2015-01-28 DIAGNOSIS — S14159S Other incomplete lesion at unspecified level of cervical spinal cord, sequela: Secondary | ICD-10-CM | POA: Diagnosis not present

## 2015-01-28 DIAGNOSIS — S12090S Other displaced fracture of first cervical vertebra, sequela: Secondary | ICD-10-CM | POA: Insufficient documentation

## 2015-01-28 DIAGNOSIS — N319 Neuromuscular dysfunction of bladder, unspecified: Secondary | ICD-10-CM | POA: Diagnosis not present

## 2015-01-28 DIAGNOSIS — M7502 Adhesive capsulitis of left shoulder: Secondary | ICD-10-CM | POA: Diagnosis not present

## 2015-01-28 DIAGNOSIS — S12200S Unspecified displaced fracture of third cervical vertebra, sequela: Secondary | ICD-10-CM | POA: Diagnosis not present

## 2015-01-28 MED ORDER — NAPROXEN 500 MG PO TABS: 500.0000 mg | ORAL_TABLET | Freq: Two times a day (BID) | ORAL | Status: DC

## 2015-01-28 NOTE — Patient Instructions (Signed)
TAKE THE NAPROXEN ONCE DAILY INITIALLY----YOU CAN INCREASE TO TWICE DAILY IN ONE WEEK IF JOINTS ARE STILL BOTHERING YOU.  STOP CELEBREX   PLEASE CALL ME WITH ANY PROBLEMS OR QUESTIONS (#[phone]). HAVE A HAPPY HOLIDAY SEASON!!!

## 2015-01-28 NOTE — Progress Notes (Signed)
Subjective:    Patient ID: [name], [name], male    DOB: Jun 23, 1945, 69 y.o.   MRN: [id number]  HPI   [name] is here in follow up of his SCI and spastic tetraplegia. We did botox injections last October which helped his left wrist and finger movement.    Pain Inventory Average Pain 2 Pain Right Now 2 My pain is aching  In the last 24 hours, has pain interfered with the following? General activity 5 Relation with others 9 Enjoyment of life 9 What TIME of day is your pain at its worst? varies Sleep (in general) Fair  Pain is worse with: no selection Pain improves with: heat/ice and therapy/exercise Relief from Meds: 5  Mobility walk without assistance use a cane how many minutes can you walk? 30 do you drive?  yes  Function retired I need assistance with the following:  dressing  Neuro/Psych spasms  Prior Studies Any changes since last visit?  no  Physicians involved in your care Any changes since last visit?  no   History reviewed. No pertinent family history. Social History   Social History  . Marital Status: Single    Spouse Name: N/A  . Number of Children: N/A  . Years of Education: N/A   Social History Main Topics  . Smoking status: Never Smoker   . Smokeless tobacco: None  . Alcohol Use: No  . Drug Use: No  . Sexual Activity: Not Asked   Other Topics Concern  . None   Social History Narrative   History reviewed. No pertinent past surgical history. Past Medical History  Diagnosis Date  . Diabetes mellitus without complication ([facility])   . Hypertension   . Arthritis   . Cataract    BP 149/67 mmHg  Pulse 79  Resp 14  SpO2 98%  Opioid Risk Score:   Fall Risk Score:  `1  Depression screen PHQ 2/9  Depression screen [place] 2/9 11/26/2014 05/09/2014  Decreased Interest 0 0  Down, Depressed, Hopeless 0 0  PHQ - 2 Score 0 0  Altered sleeping - 0  Tired, decreased energy - 1  Change in appetite - 0  Feeling bad or failure about  yourself  - 1  Trouble concentrating - 0  Moving slowly or fidgety/restless - 0  Suicidal thoughts - 0  PHQ-9 Score - 2     Review of Systems  Musculoskeletal:       Spasms   All other systems reviewed and are negative.      Objective:   Physical Exam  HEENT:wound looks great, granulating, drying up  -  Cardio: RRR and no murmur  Resp: CTA B/L and unlabored  GI: BS positive and NT,ND  Extremity: Pulses positive. mild Edema LUE tr to 1+  Skin: Intact. Scalp wound healed--scarred Neuro: Flat, Abnormal Sensory pt reports equal LT and proprio BLE , Abnormal Motor 4+ R delt, bi, tri, grip,HF, KE ADF 4+. Left: 4- delt bi, tri, grip, and Left HF, KE, ADF are 4- as well.  Musc/Skel: . Left shoulder PROM is near 90 degrees abduction but minimal pain. Left pec major,minor tightness 2-3/4/.  left pronator teres 1/4,--   pronator quadratus (1/4) finger flexors trace. May have 1 to trace/4 tone in left teres major/minor and infrapinatus- walking with cane. Stands easily with good balance. uses cane effectively for support. DTR's 3+ left more than right . Gen NAD.  Psych: mood pleasant and appropriate   Assessment/Plan:  1. Functional deficits  secondary to C-spine fractures ant arch C1,as well as C3 spinous fracture with INCOMPLETE SCI and spastic tetraplegia -outpt OT to continue---he's making nice gains and responding to botox as well 3. Pain Management: prn hydrocodone, naproxen  -pain minimal at this point  4. Diabetes mellitus with peripheral neuropathy.  5. Adhesive capsulitis left shoulder--- slow, gradual improvement, pain better  -continue with aggressive ROM, therapy -WILL REPLACE celebrex with naproxen 500mg  qd to bid 6. Spastic tetraplegia: -as he continues to get results from the botox---will focus on left pec major/minor---300 units.  -hep to continue also  7. Neurogenic bowel and bladder---improved Follow up in about 1 month for botox. Fifteen  minutes of face to face patient care time were spent during this visit. All questions were encouraged and answered.

## 2015-01-30 ENCOUNTER — Ambulatory Visit: Payer: Medicare Other | Admitting: Urology

## 2015-02-07 ENCOUNTER — Other Ambulatory Visit: Payer: 59

## 2015-03-11 ENCOUNTER — Encounter: Payer: Medicare Other | Attending: Physical Medicine & Rehabilitation | Admitting: Physical Medicine & Rehabilitation

## 2015-03-11 ENCOUNTER — Encounter: Payer: Self-pay | Admitting: Physical Medicine & Rehabilitation

## 2015-03-11 VITALS — BP 157/79 | HR 70 | Resp 12

## 2015-03-11 DIAGNOSIS — M7502 Adhesive capsulitis of left shoulder: Secondary | ICD-10-CM | POA: Diagnosis not present

## 2015-03-11 DIAGNOSIS — S12090S Other displaced fracture of first cervical vertebra, sequela: Secondary | ICD-10-CM | POA: Diagnosis not present

## 2015-03-11 DIAGNOSIS — M25512 Pain in left shoulder: Secondary | ICD-10-CM | POA: Insufficient documentation

## 2015-03-11 DIAGNOSIS — S129XXS Fracture of neck, unspecified, sequela: Secondary | ICD-10-CM | POA: Diagnosis not present

## 2015-03-11 DIAGNOSIS — N319 Neuromuscular dysfunction of bladder, unspecified: Secondary | ICD-10-CM | POA: Diagnosis not present

## 2015-03-11 DIAGNOSIS — K592 Neurogenic bowel, not elsewhere classified: Secondary | ICD-10-CM | POA: Diagnosis not present

## 2015-03-11 DIAGNOSIS — S14109S Unspecified injury at unspecified level of cervical spinal cord, sequela: Secondary | ICD-10-CM | POA: Diagnosis not present

## 2015-03-11 DIAGNOSIS — G8250 Quadriplegia, unspecified: Secondary | ICD-10-CM | POA: Diagnosis not present

## 2015-03-11 DIAGNOSIS — S12200S Unspecified displaced fracture of third cervical vertebra, sequela: Secondary | ICD-10-CM | POA: Insufficient documentation

## 2015-03-11 DIAGNOSIS — S14159S Other incomplete lesion at unspecified level of cervical spinal cord, sequela: Secondary | ICD-10-CM | POA: Insufficient documentation

## 2015-03-11 DIAGNOSIS — E1140 Type 2 diabetes mellitus with diabetic neuropathy, unspecified: Secondary | ICD-10-CM | POA: Insufficient documentation

## 2015-03-11 NOTE — Patient Instructions (Signed)
  PLEASE CALL ME WITH ANY PROBLEMS OR QUESTIONS (#[phone]).      

## 2015-03-11 NOTE — Progress Notes (Signed)
Botox Injection for spasticity using needle EMG guidance Indication: G82.50, chronic incomplete spastic tetraplegia  Dilution: 100 Units/ml        Total Units Injected: 300 Indication: Severe spasticity which interferes with ADL,mobility and/or  hygiene and is unresponsive to medication management and other conservative care Informed consent was obtained after describing risks and benefits of the procedure with the patient. This includes bleeding, bruising, infection, excessive weakness, or medication side effects. A REMS form is on file and signed.  Needle: 50mm injectable monopolar needle electrode  Number of units per muscle on LUE Pectoralis Major 100 units Pectoralis Minor 100 units Teres Major 50 units Teres Minor 50 units Biceps 0 units Brachioradialis 0 units FCR 0 units FCU 0 units FDS 0 units FDP 0 units FPL 0 units Pronator Teres 0 units Pronator Quadratus 0 units Quadriceps 0 units Gastroc/soleus 0 units Hamstrings 0 units Tibialis Posterior 0 units EHL 0 units All injections were done after obtaining appropriate EMG activity and after negative drawback for blood. The patient tolerated the procedure well. Post procedure instructions were given. A followup appointment was made fo r two months

## 2015-04-03 DIAGNOSIS — H11441 Conjunctival cysts, right eye: Secondary | ICD-10-CM | POA: Diagnosis not present

## 2015-04-25 DIAGNOSIS — S14121D Central cord syndrome at C1 level of cervical spinal cord, subsequent encounter: Secondary | ICD-10-CM | POA: Diagnosis not present

## 2015-04-25 DIAGNOSIS — R351 Nocturia: Secondary | ICD-10-CM | POA: Diagnosis not present

## 2015-04-29 ENCOUNTER — Encounter: Payer: Medicare Other | Attending: Physical Medicine & Rehabilitation | Admitting: Physical Medicine & Rehabilitation

## 2015-04-29 ENCOUNTER — Encounter: Payer: Self-pay | Admitting: Physical Medicine & Rehabilitation

## 2015-04-29 VITALS — BP 158/74 | HR 80 | Resp 16

## 2015-04-29 DIAGNOSIS — E1140 Type 2 diabetes mellitus with diabetic neuropathy, unspecified: Secondary | ICD-10-CM | POA: Insufficient documentation

## 2015-04-29 DIAGNOSIS — K592 Neurogenic bowel, not elsewhere classified: Secondary | ICD-10-CM | POA: Insufficient documentation

## 2015-04-29 DIAGNOSIS — S14159S Other incomplete lesion at unspecified level of cervical spinal cord, sequela: Secondary | ICD-10-CM | POA: Insufficient documentation

## 2015-04-29 DIAGNOSIS — S12200S Unspecified displaced fracture of third cervical vertebra, sequela: Secondary | ICD-10-CM | POA: Diagnosis not present

## 2015-04-29 DIAGNOSIS — N319 Neuromuscular dysfunction of bladder, unspecified: Secondary | ICD-10-CM | POA: Insufficient documentation

## 2015-04-29 DIAGNOSIS — S39012A Strain of muscle, fascia and tendon of lower back, initial encounter: Secondary | ICD-10-CM | POA: Diagnosis not present

## 2015-04-29 DIAGNOSIS — M7502 Adhesive capsulitis of left shoulder: Secondary | ICD-10-CM | POA: Diagnosis not present

## 2015-04-29 DIAGNOSIS — M25512 Pain in left shoulder: Secondary | ICD-10-CM | POA: Diagnosis not present

## 2015-04-29 DIAGNOSIS — S12090S Other displaced fracture of first cervical vertebra, sequela: Secondary | ICD-10-CM | POA: Diagnosis not present

## 2015-04-29 NOTE — Patient Instructions (Signed)
TAKE NAPROXEN TWICE DAILY WITH FOOD FOR 3-4 DAYS TAKE BACLOFEN TWICE DAILY FOR 2 DAYS  YOU CAN TAKE BOTH AS NEEDED THEREAFTER   USE ADEQUATE ICE, HEAT FOR YOUR BACK. REMEMBER TO STRETCH AND USE GOOD POSTURE EVERY DAY.    PLEASE CALL ME WITH ANY PROBLEMS OR QUESTIONS (#[phone]).

## 2015-04-29 NOTE — Progress Notes (Signed)
Subjective:    Patient ID: [name], [name], male    DOB: [birth year], 70 y.o.   MRN: [id number]  HPI   [name] is here in follow up of his spastic left hemiparesis. We injected his left pecs/shoulder at last visit with botox. Went to urgent care Friday for back pain.  Was told he had UTI and started on Bactrim.  He is still feeling like a knife stabbing in his right flank---perhaps it's a little better since being on the abx. He denies having any fever or chills. The back bothers him when he bends or stands completely upright. The pain is more severe on the right side. He has used a little heat but not much else.  He had good results with botox. His left arm is much more functional than it has been. He is using his hand to grasp objects. His shoulder remains tight.   Pain Inventory Average Pain 9 Pain Right Now 8 My pain is sharp and stabbing  In the last 24 hours, has pain interfered with the following? General activity 9 Relation with others 9 Enjoyment of life 9 What TIME of day is your pain at its worst? morning daytime and evening Sleep (in general) NA  Pain is worse with: walking, bending and inactivity Pain improves with: rest and medication Relief from Meds: 6  Mobility use a cane ability to climb steps?  yes do you drive?  yes  Function retired I need assistance with the following:  dressing  Neuro/Psych numbness  Prior Studies Any changes since last visit?  no  Physicians involved in your care Any changes since last visit?  no   History reviewed. No pertinent family history. Social History   Social History  . Marital Status: Single    Spouse Name: N/A  . Number of Children: N/A  . Years of Education: N/A   Social History Main Topics  . Smoking status: Never Smoker   . Smokeless tobacco: None  . Alcohol Use: No  . Drug Use: No  . Sexual Activity: Not Asked   Other Topics Concern  . None   Social History Narrative   History reviewed. No  pertinent past surgical history. Past Medical History  Diagnosis Date  . Diabetes mellitus without complication ([facility])   . Hypertension   . Arthritis   . Cataract    BP 158/74 mmHg  Pulse 80  Resp 16  Opioid Risk Score:   Fall Risk Score:  `1  Depression screen PHQ 2/9  Depression screen [facility] 2/9 04/29/2015 11/26/2014 05/09/2014  Decreased Interest 0 0 0  Down, Depressed, Hopeless 1 0 0  PHQ - 2 Score 1 0 0  Altered sleeping 1 - 0  Tired, decreased energy 0 - 1  Change in appetite 0 - 0  Feeling bad or failure about yourself  0 - 1  Trouble concentrating 0 - 0  Moving slowly or fidgety/restless 0 - 0  Suicidal thoughts 0 - 0  PHQ-9 Score 2 - 2    Pain I  History reviewed. No pertinent family history. Social History   Social History  . Marital Status: Single    Spouse Name: N/A  . Number of Children: N/A  . Years of Education: N/A   Social History Main Topics  . Smoking status: Never Smoker   . Smokeless tobacco: None  . Alcohol Use: No  . Drug Use: No  . Sexual Activity: Not Asked   Other Topics Concern  . None  Social History Narrative   History reviewed. No pertinent past surgical history. Past Medical History  Diagnosis Date  . Diabetes mellitus without complication ([facility])   . Hypertension   . Arthritis   . Cataract    BP 158/74 mmHg  Pulse 80  Resp 16  Opioid Risk Score:   Fall Risk Score:  `1  Depression screen PHQ 2/9  [facility] screen [facility] 2/9 04/29/2015 11/26/2014 05/09/2014  Decreased Interest 0 0 0  Down, Depressed, Hopeless 1 0 0  PHQ - 2 Score 1 0 0  Altered sleeping 1 - 0  Tired, decreased energy 0 - 1  Change in appetite 0 - 0  Feeling bad or failure about yourself  0 - 1  Trouble concentrating 0 - 0  Moving slowly or fidgety/restless 0 - 0  Suicidal thoughts 0 - 0  PHQ-9 Score 2 - 2     Review of Systems  Genitourinary: Positive for flank pain.  All other systems reviewed and are negative.      Objective:   Physical  Exam  HEENT:wound looks great, granulating, drying up  -  Cardio: RRR and no murmur  Resp: CTA B/L and unlabored  GI: BS positive and NT,ND  Extremity: Pulses positive. mild Edema LUE tr to 1+  Skin: Intact. Scalp wound healed--scarred Neuro: Flat, Abnormal Sensory pt reports equal LT and proprio BLE , Abnormal Motor 4+ R delt, bi, tri, grip,HF, KE ADF 4+. Left: 4- delt bi, tri, grip too, and Left HF, KE, ADF are 4- as well.  Musc/Skel: . Left shoulder PROM is near 90 degrees abduction but minimal pain. He has a hard stop still in the left shoulder beyond 90. Right low back tender with flexion and side bending to the right. mild muscle spasm appreciated deep on the right.  Neuro:  Left pec major,minor tightness 1/4/. left pronator teres 1/4,-- pronator quadratus (1/4) finger flexors trace.  Trace/4 tone in left teres major/minor and infrapinatus- walking with cane. [facility] to  [facility] left side with gait and swing his trunk and pelvis around slightly to help clear. DTR's 3+ left more than right Gen NAD.  Psych: mood pleasant and appropriate   Assessment/Plan:  1. Functional deficits secondary to C-spine fractures ant arch C1,as well as C3 spinous fracture with INCOMPLETE SCI and spastic tetraplegia -HEP 3. Pain Management/low back strain----likely due to compensatory strategies of gait:   naproxen bid with food for two days--then prn -baclofen bid for 2 daysthen prn -think about pelvis/posture when walking -aggressive heat/ice -if symptoms don't improve he'll call me.  4. Diabetes mellitus with peripheral neuropathy.  5. Adhesive capsulitis left shoulder--- slow, gradual improvement, pain better  -continue with aggressive ROM,   -naproxen -discussed surgical option if he would like to pursue this 6. Spastic tetraplegia: -consider botox later in the year. No further injections indicated at this time.  7. Neurogenic bowel and bladder---improved   Follow up in  about 1 month for botox. Fifteen minutes of face to face patient care time were spent during this visit. All questions were encouraged and answered.

## 2015-05-06 ENCOUNTER — Encounter (HOSPITAL_COMMUNITY): Payer: Self-pay | Admitting: Emergency Medicine

## 2015-05-06 ENCOUNTER — Emergency Department (HOSPITAL_COMMUNITY): Payer: Medicare Other

## 2015-05-06 ENCOUNTER — Emergency Department (HOSPITAL_COMMUNITY)
Admission: EM | Admit: 2015-05-06 | Discharge: 2015-05-06 | Disposition: A | Discharge status: Home / Self Care | Payer: Medicare Other | Attending: Emergency Medicine | Admitting: Emergency Medicine

## 2015-05-06 DIAGNOSIS — X58XXXA Exposure to other specified factors, initial encounter: Secondary | ICD-10-CM | POA: Insufficient documentation

## 2015-05-06 DIAGNOSIS — Y929 Unspecified place or not applicable: Secondary | ICD-10-CM | POA: Diagnosis not present

## 2015-05-06 DIAGNOSIS — E119 Type 2 diabetes mellitus without complications: Secondary | ICD-10-CM | POA: Diagnosis not present

## 2015-05-06 DIAGNOSIS — Z791 Long term (current) use of non-steroidal anti-inflammatories (NSAID): Secondary | ICD-10-CM | POA: Insufficient documentation

## 2015-05-06 DIAGNOSIS — M4306 Spondylolysis, lumbar region: Secondary | ICD-10-CM | POA: Insufficient documentation

## 2015-05-06 DIAGNOSIS — Z79899 Other long term (current) drug therapy: Secondary | ICD-10-CM | POA: Diagnosis not present

## 2015-05-06 DIAGNOSIS — M545 Low back pain: Secondary | ICD-10-CM | POA: Diagnosis present

## 2015-05-06 DIAGNOSIS — Y939 Activity, unspecified: Secondary | ICD-10-CM | POA: Insufficient documentation

## 2015-05-06 DIAGNOSIS — Z7984 Long term (current) use of oral hypoglycemic drugs: Secondary | ICD-10-CM | POA: Diagnosis not present

## 2015-05-06 DIAGNOSIS — S39012A Strain of muscle, fascia and tendon of lower back, initial encounter: Secondary | ICD-10-CM | POA: Diagnosis not present

## 2015-05-06 DIAGNOSIS — M47896 Other spondylosis, lumbar region: Secondary | ICD-10-CM

## 2015-05-06 DIAGNOSIS — I10 Essential (primary) hypertension: Secondary | ICD-10-CM | POA: Diagnosis not present

## 2015-05-06 DIAGNOSIS — Y999 Unspecified external cause status: Secondary | ICD-10-CM | POA: Insufficient documentation

## 2015-05-06 MED ORDER — CYCLOBENZAPRINE HCL 10 MG PO TABS: 10.0000 mg | ORAL_TABLET | Freq: Two times a day (BID) | ORAL | Status: DC | PRN

## 2015-05-06 MED ORDER — MELOXICAM 15 MG PO TABS: 15.0000 mg | ORAL_TABLET | Freq: Every day | ORAL | Status: DC

## 2015-05-06 NOTE — ED Notes (Signed)
Pt c/o right lower back pain x 10 days. Pt seen at urgent care last Friday and dx with uti. Pt states he has been taking antibiotic with no relief (last dose due today). Pt staets he feels the pain is muscular. Denies injury.
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
## 2015-05-06 NOTE — ED Notes (Signed)
C/o back pain about 2 weeks ago and [place] and pain continues.  Took last two antibiotics this am.  [name] FlesherWent to [facility] in [place] last week and was told he had a pulled muscle.  Here today because pain continues.  Rates pain 3/10 when sitting and pain increases to 10/10 with movement.

## 2015-05-06 NOTE — Discharge Instructions (Signed)
Please use flexeril at bed time. May use it 2 times daily for severe pain or problem. This medication may cause drowsiness, use with caution. Use mobic daily with a meal. See Dr Juanetta GoslingHawkins for additional evaluation if not improving. Your xray reveals arthritis in the lower back. Your exam suggest muscle strain present. Heat to the lower back may be helpful. Muscle Strain A muscle strain (pulled muscle) happens when a muscle is stretched beyond normal length. It happens when a sudden, violent force stretches your muscle too far. Usually, a few of the fibers in your muscle are torn. Muscle strain is common in athletes. Recovery usually takes 1-2 weeks. Complete healing takes 5-6 weeks.  HOME CARE   Follow the PRICE method of treatment to help your injury get better. Do this the first 2-3 days after the injury:  Protect. Protect the muscle to keep it from getting injured again.  Rest. Limit your activity and rest the injured body part.  Ice. Put ice in a plastic bag. Place a towel between your skin and the bag. Then, apply the ice and leave it on from 15-20 minutes each hour. After the third day, switch to moist heat packs.  Compression. Use a splint or elastic bandage on the injured area for comfort. Do not put it on too tightly.  Elevate. Keep the injured body part above the level of your heart.  Only take medicine as told by your doctor.  Warm up before doing exercise to prevent future muscle strains. GET HELP IF:   You have more pain or puffiness (swelling) in the injured area.  You feel numbness, tingling, or notice a loss of strength in the injured area. MAKE SURE YOU:   Understand these instructions.  Will watch your condition.  Will get help right away if you are not doing well or get worse.   This information is not intended to replace advice given to you by your health care provider. Make sure you discuss any questions you have with your health care provider.   Document  Released: 11/19/2007 Document Revised: 11/30/2012 Document Reviewed: 09/08/2012 Elsevier Interactive Patient Education [employer]2016 [employer]

## 2015-05-06 NOTE — ED Provider Notes (Signed)
CSN: [id number]     Arrival date & time 05/06/15  8469 History   First MD Initiated Contact with Patient 05/06/15 1005     Chief Complaint  Patient presents with  . Back Pain     (Consider location/radiation/quality/duration/timing/severity/associated sxs/prior Treatment) Patient is a 70 y.o. male presenting with back pain. The history is provided by the patient.  Back Pain Location:  Lumbar spine Quality:  Aching Radiates to:  Does not radiate Pain severity:  Moderate Pain is:  Same all the time Onset quality:  Gradual Duration:  10 days   Past Medical History  Diagnosis Date  . Diabetes mellitus without complication (HCC)   . Hypertension   . Arthritis   . Cataract    History reviewed. No pertinent past surgical history. No family history on file. Social History  Substance Use Topics  . Smoking status: Never Smoker   . Smokeless tobacco: None  . Alcohol Use: No    Review of Systems  Musculoskeletal: Positive for back pain.      Allergies  Ciprofloxacin  Home Medications   Prior to Admission medications   Medication Sig Start Date End Date Taking? Authorizing Provider  EXFORGE 5-160 MG per tablet Take 1 tablet by mouth daily.  08/23/13  Yes Historical Provider, MD  glyBURIDE-metformin (GLUCOVANCE) 1.25-250 MG per tablet Take 1 tablet by mouth 2 (two) times daily with a meal.  08/23/13  Yes Historical Provider, MD  ibuprofen (ADVIL,MOTRIN) 200 MG tablet Take 400 mg by mouth every 6 (six) hours as needed for moderate pain.   Yes Historical Provider, MD  sulfamethoxazole-trimethoprim (BACTRIM,SEPTRA) 400-80 MG tablet Take 2 tablets by mouth 2 (two) times daily. 04/26/15  Yes Historical Provider, MD   BP 154/74 mmHg  Pulse 83  Temp(Src) 97.5 F (36.4 C)  Resp 16  Ht  (1.778 m)  Wt 98.431 kg  BMI 31.14 kg/m2  SpO2 100% Physical Exam  Constitutional: He is oriented to person, place, and time. He appears well-developed and well-nourished.  Non-toxic  appearance.  HENT:  Head: Normocephalic.  Right Ear: Tympanic membrane and external ear normal.  Left Ear: Tympanic membrane and external ear normal.  Eyes: EOM and lids are normal. Pupils are equal, round, and reactive to light.  Neck: Normal range of motion. Neck supple. Carotid bruit is not present.  Cardiovascular: Normal rate, regular rhythm, normal heart sounds, intact distal pulses and normal pulses.   Pulmonary/Chest: Breath sounds normal. No respiratory distress.  Abdominal: Soft. Bowel sounds are normal. There is no tenderness. There is no guarding.  Musculoskeletal: Normal range of motion.       Lumbar back: He exhibits tenderness, pain and spasm. He exhibits no deformity.  Pain of the paraspinal lumbar area is mild to palpation, but increases significantly with change of position and attempted range of motion. Right side pain greater than left.  Lymphadenopathy:       Head (right side): No submandibular adenopathy present.       Head (left side): No submandibular adenopathy present.    He has no cervical adenopathy.  Neurological: He is alert and oriented to person, place, and time. He has normal strength. No cranial nerve deficit or sensory deficit.  Mild to moderate weakness of the left upper and lower extremity, this is not new, but baseline for the patient. Patient ambulates with a cane.  Skin: Skin is warm and dry.  Psychiatric: He has a normal mood and affect. His speech is normal.  Nursing  note and vitals reviewed.   ED Course  Patient seen with me by Dr. Hyacinth MeekerMiller.   Procedures (including critical care time) Labs Review Labs Reviewed - No data to display  Imaging Review No results found. I have personally reviewed and evaluated these images and lab results as part of my medical decision-making.   EKG Interpretation None      MDM  X-ray of the lumbar spine reveals degenerative facet arthritis. There no compression fractures or other changes noted on the  examination. There no new gross neurologic deficits appreciated on examination at this time.   Prescription for Flexeril and Mobic given to the patient. Patient is instructed to use caution using the Flexeril. He is advised to use the Flexeril at night, and to use it twice a day if severe discomfort. The patient is to follow-up with his primary physician and specialist if not improving.    Final diagnoses:  Lumbar strain, initial encounter  Other osteoarthritis of spine, lumbar region    **I have reviewed nursing notes, vital signs, and all appropriate lab and imaging results for this patient.Ivery Quale*    Leila Schuff, PA-C 05/06/15 2041  Eber HongBrian Miller, MD 05/07/15 [phone]
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
## 2015-07-09 DIAGNOSIS — E119 Type 2 diabetes mellitus without complications: Secondary | ICD-10-CM | POA: Diagnosis not present

## 2015-07-09 DIAGNOSIS — G43909 Migraine, unspecified, not intractable, without status migrainosus: Secondary | ICD-10-CM | POA: Diagnosis not present

## 2015-07-09 DIAGNOSIS — N401 Enlarged prostate with lower urinary tract symptoms: Secondary | ICD-10-CM | POA: Diagnosis not present

## 2015-07-09 DIAGNOSIS — S069X9D Unspecified intracranial injury with loss of consciousness of unspecified duration, subsequent encounter: Secondary | ICD-10-CM | POA: Diagnosis not present

## 2015-07-09 DIAGNOSIS — I10 Essential (primary) hypertension: Secondary | ICD-10-CM | POA: Diagnosis not present

## 2015-08-26 ENCOUNTER — Encounter: Payer: Medicare Other | Attending: Physical Medicine & Rehabilitation | Admitting: Physical Medicine & Rehabilitation

## 2016-01-13 DIAGNOSIS — L408 Other psoriasis: Secondary | ICD-10-CM | POA: Diagnosis not present

## 2016-01-13 DIAGNOSIS — Z125 Encounter for screening for malignant neoplasm of prostate: Secondary | ICD-10-CM | POA: Diagnosis not present

## 2016-01-13 DIAGNOSIS — G43909 Migraine, unspecified, not intractable, without status migrainosus: Secondary | ICD-10-CM | POA: Diagnosis not present

## 2016-01-13 DIAGNOSIS — K429 Umbilical hernia without obstruction or gangrene: Secondary | ICD-10-CM | POA: Diagnosis not present

## 2016-01-13 DIAGNOSIS — E1165 Type 2 diabetes mellitus with hyperglycemia: Secondary | ICD-10-CM | POA: Diagnosis not present

## 2016-01-13 DIAGNOSIS — Z0000 Encounter for general adult medical examination without abnormal findings: Secondary | ICD-10-CM | POA: Diagnosis not present

## 2016-01-13 DIAGNOSIS — Z1211 Encounter for screening for malignant neoplasm of colon: Secondary | ICD-10-CM | POA: Diagnosis not present

## 2016-01-13 DIAGNOSIS — E669 Obesity, unspecified: Secondary | ICD-10-CM | POA: Diagnosis not present

## 2016-01-13 DIAGNOSIS — I10 Essential (primary) hypertension: Secondary | ICD-10-CM | POA: Diagnosis not present

## 2016-01-13 DIAGNOSIS — N5201 Erectile dysfunction due to arterial insufficiency: Secondary | ICD-10-CM | POA: Diagnosis not present

## 2016-11-24 DIAGNOSIS — Z23 Encounter for immunization: Secondary | ICD-10-CM | POA: Diagnosis not present

## 2017-01-20 ENCOUNTER — Other Ambulatory Visit: Payer: Self-pay | Admitting: Physical Medicine & Rehabilitation

## 2017-01-20 DIAGNOSIS — G8250 Quadriplegia, unspecified: Secondary | ICD-10-CM

## 2017-01-20 DIAGNOSIS — M25512 Pain in left shoulder: Secondary | ICD-10-CM

## 2017-01-20 DIAGNOSIS — M7502 Adhesive capsulitis of left shoulder: Secondary | ICD-10-CM

## 2017-02-04 DIAGNOSIS — Z1211 Encounter for screening for malignant neoplasm of colon: Secondary | ICD-10-CM | POA: Diagnosis not present

## 2017-02-04 DIAGNOSIS — Z0000 Encounter for general adult medical examination without abnormal findings: Secondary | ICD-10-CM | POA: Diagnosis not present

## 2017-02-25 ENCOUNTER — Ambulatory Visit (HOSPITAL_COMMUNITY): Payer: Medicare Other | Attending: Pulmonary Disease

## 2017-02-25 DIAGNOSIS — M25512 Pain in left shoulder: Secondary | ICD-10-CM | POA: Diagnosis not present

## 2017-02-25 DIAGNOSIS — M25612 Stiffness of left shoulder, not elsewhere classified: Secondary | ICD-10-CM | POA: Diagnosis not present

## 2017-02-25 DIAGNOSIS — R29898 Other symptoms and signs involving the musculoskeletal system: Secondary | ICD-10-CM | POA: Diagnosis not present
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
## 2017-02-25 NOTE — Patient Instructions (Signed)
Complete the following exercises 2-3 times a day.  Doorway Stretch  Place each hand opposite each other on the doorway. (You can change where you feel the stretch by moving arms higher or lower.) Step through with one foot and bend front knee until a stretch is felt and hold. Step through with the opposite foot on the next rep. Hold for __10-15___ seconds. Repeat __2__times.      Internal Rotation Across Back  Grab the end of a towel with your affected side, palm facing backwards. Grab the towel with your unaffected side and pull your affected hand across your back until you feel a stretch in the front of your shoulder. If you feel pain, pull just to the pain, do not pull through the pain. Hold. Return your affected arm to your side. Try to keep your hand/arm close to your body during the entire movement.     Hold for 10-15 seconds. Complete 2 times.        Posterior Capsule Stretch   Stand or sit, one arm across body so hand rests over opposite shoulder. Gently push on crossed elbow with other hand until stretch is felt in shoulder of crossed arm. Hold _10-15__ seconds.  Repeat _2__ times per session. Do ___ sessions per day.   Wall Flexion  Slide your arm up the wall or door frame until a stretch is felt in your shoulder . Hold for 10-15 seconds. Complete 2 times     Shoulder Abduction Stretch  Stand side ways by a wall with affected up on wall. Gently step in toward wall to feel stretch. Hold for 10-15 seconds. Complete 2 times.   

## 2017-03-03 ENCOUNTER — Other Ambulatory Visit: Payer: Self-pay

## 2017-03-03 ENCOUNTER — Encounter (HOSPITAL_COMMUNITY): Payer: Self-pay | Admitting: Occupational Therapy

## 2017-03-03 ENCOUNTER — Ambulatory Visit (HOSPITAL_COMMUNITY): Payer: Medicare Other | Admitting: Occupational Therapy

## 2017-03-03 DIAGNOSIS — M25612 Stiffness of left shoulder, not elsewhere classified: Secondary | ICD-10-CM | POA: Diagnosis not present

## 2017-03-03 DIAGNOSIS — M25512 Pain in left shoulder: Secondary | ICD-10-CM

## 2017-03-03 DIAGNOSIS — R29898 Other symptoms and signs involving the musculoskeletal system: Secondary | ICD-10-CM | POA: Diagnosis not present
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
## 2017-03-05 ENCOUNTER — Ambulatory Visit (HOSPITAL_COMMUNITY): Payer: Medicare Other

## 2017-03-05 ENCOUNTER — Encounter (HOSPITAL_COMMUNITY): Payer: Self-pay

## 2017-03-05 DIAGNOSIS — R29898 Other symptoms and signs involving the musculoskeletal system: Secondary | ICD-10-CM

## 2017-03-05 DIAGNOSIS — M25512 Pain in left shoulder: Secondary | ICD-10-CM | POA: Diagnosis not present

## 2017-03-05 DIAGNOSIS — M25612 Stiffness of left shoulder, not elsewhere classified: Secondary | ICD-10-CM
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
## 2017-03-10 ENCOUNTER — Ambulatory Visit (HOSPITAL_COMMUNITY): Payer: Medicare Other

## 2017-03-10 ENCOUNTER — Encounter (HOSPITAL_COMMUNITY): Payer: Self-pay

## 2017-03-10 DIAGNOSIS — M25512 Pain in left shoulder: Secondary | ICD-10-CM | POA: Diagnosis not present

## 2017-03-10 DIAGNOSIS — R29898 Other symptoms and signs involving the musculoskeletal system: Secondary | ICD-10-CM | POA: Diagnosis not present

## 2017-03-10 DIAGNOSIS — M25612 Stiffness of left shoulder, not elsewhere classified: Secondary | ICD-10-CM
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
## 2017-03-10 NOTE — Patient Instructions (Signed)

## 2017-03-12 ENCOUNTER — Ambulatory Visit (HOSPITAL_COMMUNITY): Payer: Medicare Other

## 2017-03-12 ENCOUNTER — Encounter (HOSPITAL_COMMUNITY): Payer: Self-pay

## 2017-03-12 DIAGNOSIS — R29898 Other symptoms and signs involving the musculoskeletal system: Secondary | ICD-10-CM

## 2017-03-12 DIAGNOSIS — M25512 Pain in left shoulder: Secondary | ICD-10-CM

## 2017-03-12 DIAGNOSIS — M25612 Stiffness of left shoulder, not elsewhere classified: Secondary | ICD-10-CM | POA: Diagnosis not present
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
## 2017-03-17 ENCOUNTER — Encounter (HOSPITAL_COMMUNITY): Payer: Self-pay

## 2017-03-17 ENCOUNTER — Ambulatory Visit (HOSPITAL_COMMUNITY): Payer: Medicare Other

## 2017-03-17 DIAGNOSIS — M25612 Stiffness of left shoulder, not elsewhere classified: Secondary | ICD-10-CM

## 2017-03-17 DIAGNOSIS — M25512 Pain in left shoulder: Secondary | ICD-10-CM | POA: Diagnosis not present

## 2017-03-17 DIAGNOSIS — R29898 Other symptoms and signs involving the musculoskeletal system: Secondary | ICD-10-CM | POA: Diagnosis not present
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
## 2017-03-19 ENCOUNTER — Ambulatory Visit (HOSPITAL_COMMUNITY): Payer: Medicare Other | Admitting: Occupational Therapy

## 2017-03-19 ENCOUNTER — Encounter (HOSPITAL_COMMUNITY): Payer: Self-pay | Admitting: Occupational Therapy

## 2017-03-19 DIAGNOSIS — M25512 Pain in left shoulder: Secondary | ICD-10-CM

## 2017-03-19 DIAGNOSIS — R29898 Other symptoms and signs involving the musculoskeletal system: Secondary | ICD-10-CM | POA: Diagnosis not present

## 2017-03-19 DIAGNOSIS — M25612 Stiffness of left shoulder, not elsewhere classified: Secondary | ICD-10-CM
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
## 2017-03-23 ENCOUNTER — Encounter (HOSPITAL_COMMUNITY): Payer: Self-pay

## 2017-03-23 ENCOUNTER — Ambulatory Visit (HOSPITAL_COMMUNITY): Payer: Medicare Other

## 2017-03-23 DIAGNOSIS — M25612 Stiffness of left shoulder, not elsewhere classified: Secondary | ICD-10-CM

## 2017-03-23 DIAGNOSIS — M25512 Pain in left shoulder: Secondary | ICD-10-CM | POA: Diagnosis not present

## 2017-03-23 DIAGNOSIS — R29898 Other symptoms and signs involving the musculoskeletal system: Secondary | ICD-10-CM
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
## 2017-03-25 ENCOUNTER — Encounter (HOSPITAL_COMMUNITY): Payer: Self-pay

## 2017-03-25 ENCOUNTER — Ambulatory Visit (HOSPITAL_COMMUNITY): Payer: Medicare Other

## 2017-03-25 ENCOUNTER — Other Ambulatory Visit: Payer: Self-pay

## 2017-03-25 DIAGNOSIS — M25612 Stiffness of left shoulder, not elsewhere classified: Secondary | ICD-10-CM | POA: Diagnosis not present

## 2017-03-25 DIAGNOSIS — R29898 Other symptoms and signs involving the musculoskeletal system: Secondary | ICD-10-CM

## 2017-03-25 DIAGNOSIS — M25512 Pain in left shoulder: Secondary | ICD-10-CM | POA: Diagnosis not present

## 2017-03-25 NOTE — Patient Instructions (Signed)
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
## 2017-04-13 DIAGNOSIS — E1165 Type 2 diabetes mellitus with hyperglycemia: Secondary | ICD-10-CM | POA: Diagnosis not present

## 2017-04-13 DIAGNOSIS — N401 Enlarged prostate with lower urinary tract symptoms: Secondary | ICD-10-CM | POA: Diagnosis not present

## 2017-04-13 DIAGNOSIS — I10 Essential (primary) hypertension: Secondary | ICD-10-CM | POA: Diagnosis not present

## 2017-04-13 DIAGNOSIS — S069X9D Unspecified intracranial injury with loss of consciousness of unspecified duration, subsequent encounter: Secondary | ICD-10-CM | POA: Diagnosis not present

## 2017-07-12 DIAGNOSIS — I10 Essential (primary) hypertension: Secondary | ICD-10-CM | POA: Diagnosis not present

## 2017-07-12 DIAGNOSIS — E785 Hyperlipidemia, unspecified: Secondary | ICD-10-CM | POA: Diagnosis not present

## 2017-07-12 DIAGNOSIS — N401 Enlarged prostate with lower urinary tract symptoms: Secondary | ICD-10-CM | POA: Diagnosis not present

## 2017-07-12 DIAGNOSIS — E1121 Type 2 diabetes mellitus with diabetic nephropathy: Secondary | ICD-10-CM | POA: Diagnosis not present

## 2017-09-09 DIAGNOSIS — S069X9D Unspecified intracranial injury with loss of consciousness of unspecified duration, subsequent encounter: Secondary | ICD-10-CM | POA: Diagnosis not present

## 2017-09-09 DIAGNOSIS — E119 Type 2 diabetes mellitus without complications: Secondary | ICD-10-CM | POA: Diagnosis not present

## 2017-09-09 DIAGNOSIS — N401 Enlarged prostate with lower urinary tract symptoms: Secondary | ICD-10-CM | POA: Diagnosis not present

## 2017-09-09 DIAGNOSIS — I10 Essential (primary) hypertension: Secondary | ICD-10-CM | POA: Diagnosis not present

## 2017-09-23 ENCOUNTER — Other Ambulatory Visit: Payer: Self-pay

## 2017-11-16 DIAGNOSIS — Z23 Encounter for immunization: Secondary | ICD-10-CM | POA: Diagnosis not present

## 2018-02-10 DIAGNOSIS — Z0000 Encounter for general adult medical examination without abnormal findings: Secondary | ICD-10-CM | POA: Diagnosis not present

## 2018-02-18 DIAGNOSIS — E1165 Type 2 diabetes mellitus with hyperglycemia: Secondary | ICD-10-CM | POA: Diagnosis not present

## 2018-02-18 DIAGNOSIS — K429 Umbilical hernia without obstruction or gangrene: Secondary | ICD-10-CM | POA: Diagnosis not present

## 2018-02-18 DIAGNOSIS — N401 Enlarged prostate with lower urinary tract symptoms: Secondary | ICD-10-CM | POA: Diagnosis not present

## 2018-02-18 DIAGNOSIS — E669 Obesity, unspecified: Secondary | ICD-10-CM | POA: Diagnosis not present

## 2018-02-18 DIAGNOSIS — Z0000 Encounter for general adult medical examination without abnormal findings: Secondary | ICD-10-CM | POA: Diagnosis not present

## 2018-02-18 DIAGNOSIS — Z125 Encounter for screening for malignant neoplasm of prostate: Secondary | ICD-10-CM | POA: Diagnosis not present

## 2018-02-18 DIAGNOSIS — L408 Other psoriasis: Secondary | ICD-10-CM | POA: Diagnosis not present

## 2018-02-18 DIAGNOSIS — N5201 Erectile dysfunction due to arterial insufficiency: Secondary | ICD-10-CM | POA: Diagnosis not present

## 2018-02-18 DIAGNOSIS — I10 Essential (primary) hypertension: Secondary | ICD-10-CM | POA: Diagnosis not present

## 2018-02-18 DIAGNOSIS — G43909 Migraine, unspecified, not intractable, without status migrainosus: Secondary | ICD-10-CM | POA: Diagnosis not present

## 2018-08-11 DIAGNOSIS — N401 Enlarged prostate with lower urinary tract symptoms: Secondary | ICD-10-CM | POA: Diagnosis not present

## 2018-08-11 DIAGNOSIS — I10 Essential (primary) hypertension: Secondary | ICD-10-CM | POA: Diagnosis not present

## 2018-08-11 DIAGNOSIS — E1121 Type 2 diabetes mellitus with diabetic nephropathy: Secondary | ICD-10-CM | POA: Diagnosis not present

## 2018-08-11 DIAGNOSIS — M1990 Unspecified osteoarthritis, unspecified site: Secondary | ICD-10-CM | POA: Diagnosis not present

## 2018-11-24 DIAGNOSIS — Z23 Encounter for immunization: Secondary | ICD-10-CM | POA: Diagnosis not present

## 2019-01-18 ENCOUNTER — Other Ambulatory Visit: Payer: Self-pay

## 2019-01-26 DIAGNOSIS — E119 Type 2 diabetes mellitus without complications: Secondary | ICD-10-CM | POA: Diagnosis not present

## 2019-03-09 ENCOUNTER — Other Ambulatory Visit: Payer: Self-pay

## 2019-03-09 ENCOUNTER — Ambulatory Visit
Admission: EM | Admit: 2019-03-09 | Discharge: 2019-03-09 | Disposition: A | Discharge status: Home / Self Care | Payer: Medicare Other

## 2019-03-09 DIAGNOSIS — Z20822 Contact with and (suspected) exposure to covid-19: Secondary | ICD-10-CM | POA: Diagnosis not present

## 2019-03-09 DIAGNOSIS — U071 COVID-19: Secondary | ICD-10-CM

## 2019-03-09 LAB — POC SARS CORONAVIRUS 2 AG -  ED: SARS Coronavirus 2 Ag: POSITIVE — AB

## 2019-03-09 MED ORDER — BENZONATATE 100 MG PO CAPS: 100.0000 mg | ORAL_CAPSULE | Freq: Three times a day (TID) | ORAL | 0 refills | Status: DC

## 2019-03-09 NOTE — ED Provider Notes (Signed)
[facility]   [id number] 03/09/19 Arrival Time: 1025   CC: COVID symptoms  SUBJECTIVE: History from: patient.  [name] is a 74 y.o. male who presents with dry cough x 3 days.  Denies sick exposure to COVID, flu or strep.  Denies recent travel.  Has tried OTC medications without relief.  Symptoms are made worse with at night.  Denies previous symptoms in the past.  Voice is hoarse secondary to cough.  Denies fever, chills, fatigue, sinus pain, rhinorrhea, sore throat, SOB, wheezing, chest pain, nausea, changes in bowel or bladder habits.    ROS: As per HPI.  All other pertinent ROS negative.     Past Medical History:  Diagnosis Date  . Arthritis   . Cataract   . Diabetes mellitus without complication ([place])   . Hypertension    History reviewed. No pertinent surgical history. Allergies  Allergen Reactions  . Ciprofloxacin     sweating   No current facility-administered medications on file prior to encounter.   Current Outpatient Medications on File Prior to Encounter  Medication Sig Dispense Refill  . amLODipine-valsartan (EXFORGE) 5-160 MG tablet Take 1 tablet by mouth daily.    Marland Kitchen atorvastatin (LIPITOR) 20 MG tablet Take 20 mg by mouth daily.    Marland Kitchen glipizide-metformin (METAGLIP) 2.5-250 MG tablet Take 1 tablet by mouth 2 (two) times daily before a meal.    . ibuprofen (ADVIL,MOTRIN) 200 MG tablet Take 400 mg by mouth every 6 (six) hours as needed for moderate pain.    . [DISCONTINUED] glyBURIDE-metformin (GLUCOVANCE) 1.25-250 MG per tablet Take 1 tablet by mouth 2 (two) times daily with a meal.      Social History   Socioeconomic History  . Marital status: Married    Spouse name: Not on file  . Number of children: Not on file  . Years of education: Not on file  . Highest education level: Not on file  Occupational History  . Not on file  Tobacco Use  . Smoking status: Never Smoker  . Smokeless tobacco: Never Used  Substance and Sexual Activity  .  Alcohol use: No  . Drug use: No  . Sexual activity: Not on file  Other Topics Concern  . Not on file  Social History Narrative  . Not on file   Social Determinants of Health   Financial Resource Strain:   . Difficulty of Paying Living Expenses: Not on file  Food Insecurity:   . Worried About Charity fundraiser in the Last Year: Not on file  . Ran Out of Food in the Last Year: Not on file  Transportation Needs:   . Lack of Transportation (Medical): Not on file  . Lack of Transportation (Non-Medical): Not on file  Physical Activity:   . Days of Exercise per Week: Not on file  . Minutes of Exercise per Session: Not on file  Stress:   . Feeling of Stress : Not on file  Social Connections:   . Frequency of Communication with Friends and Family: Not on file  . Frequency of Social Gatherings with Friends and Family: Not on file  . Attends Religious Services: Not on file  . Active Member of Clubs or Organizations: Not on file  . Attends Archivist Meetings: Not on file  . Marital Status: Not on file  Intimate Partner Violence:   . Fear of Current or Ex-Partner: Not on file  . Emotionally Abused: Not on file  . Physically Abused: Not on  file  . Sexually Abused: Not on file   Family History  Problem Relation Age of Onset  . Healthy Mother   . Healthy Father     OBJECTIVE:  Vitals:   03/09/19 1534  BP: (!) 164/71  Pulse: (!) 114  Resp: 18  Temp: 99.9 F (37.7 C)  TempSrc: Oral  SpO2: 96%     General appearance: alert; appears mildly fatigued, but nontoxic; speaking in full sentences and tolerating own secretions HEENT: NCAT; Ears: EACs clear, TMs pearly gray; Eyes: PERRL.  EOM grossly intact. Nose: nares patent without rhinorrhea, Throat: oropharynx clear, tonsils non erythematous or enlarged, uvula midline  Neck: supple without LAD Lungs: unlabored respirations, symmetrical air entry; cough: mild; no respiratory distress; CTAB Heart: regular rate and  rhythm.   Skin: warm and dry Psychological: alert and cooperative; normal mood and affect  LABS:  Results for orders placed or performed during the hospital encounter of 03/09/19 (from the past 24 hour(s))  POC SARS Coronavirus 2 Ag-ED - Nasal Swab (BD Veritor Kit)     Status: Abnormal   Collection Time: 03/09/19  3:49 PM  Result Value Ref Range   SARS Coronavirus 2 Ag Positive (A) Negative     ASSESSMENT & PLAN:  1. Suspected COVID-19 virus infection   2. COVID-19 virus infection     Meds ordered this encounter  Medications  . benzonatate (TESSALON) 100 MG capsule    Sig: Take 1 capsule (100 mg total) by mouth every 8 (eight) hours.    Dispense:  21 capsule    Refill:  0    Order Specific Question:   Supervising Provider    Answer:   Raylene Everts [[id number]]   COVID test was positive You should remain isolated in your home for 10 days from symptom onset AND greater than 72 hours after symptoms resolution (absence of fever without the use of fever-reducing medication and improvement in respiratory symptoms), whichever is longer Get plenty of rest and push fluids Tessalon perles prescribed for cough.   Use zyrtec for nasal congestion, runny nose, and/or sore throat Use flonase for nasal congestion and runny nose Use medications daily for symptom relief Use OTC medications like ibuprofen or tylenol as needed fever or pain Follow up with PCP in 1-2 days via phone or e-visit for recheck and to ensure symptoms are improving Call or go to the ED if you have any new or worsening symptoms such as fever, worsening cough, shortness of breath, chest tightness, chest pain, turning blue, changes in mental status, etc...    Reviewed expectations re: course of current medical issues. Questions answered. Outlined signs and symptoms indicating need for more acute intervention. Patient verbalized understanding. After Visit Summary given.         Lestine Box, PA-C 03/09/19  1806

## 2019-03-09 NOTE — ED Triage Notes (Signed)
Pt presents to UC w/ c/o dry cough x3 days. Pt c/o loss of voice from coughing.

## 2019-03-09 NOTE — Discharge Instructions (Signed)
COVID test was positive You should remain isolated in your home for 10 days from symptom onset AND greater than 72 hours after symptoms resolution (absence of fever without the use of fever-reducing medication and improvement in respiratory symptoms), whichever is longer Get plenty of rest and push fluids Tessalon perles prescribed for cough.   Use zyrtec for nasal congestion, runny nose, and/or sore throat Use flonase for nasal congestion and runny nose Use medications daily for symptom relief Use OTC medications like ibuprofen or tylenol as needed fever or pain Follow up with PCP in 1-2 days via phone or e-visit for recheck and to ensure symptoms are improving Call or go to the ED if you have any new or worsening symptoms such as fever, worsening cough, shortness of breath, chest tightness, chest pain, turning blue, changes in mental status, etc..Marland Kitchen

## 2019-03-10 ENCOUNTER — Telehealth: Payer: Self-pay | Admitting: Unknown Physician Specialty

## 2019-03-10 ENCOUNTER — Other Ambulatory Visit: Payer: Self-pay | Admitting: Unknown Physician Specialty

## 2019-03-10 DIAGNOSIS — U071 COVID-19: Secondary | ICD-10-CM

## 2019-03-10 DIAGNOSIS — I10 Essential (primary) hypertension: Secondary | ICD-10-CM

## 2019-03-10 DIAGNOSIS — E119 Type 2 diabetes mellitus without complications: Secondary | ICD-10-CM

## 2019-03-10 NOTE — Telephone Encounter (Signed)
Called to discuss with patient about Covid symptoms and the use of bamlanivimab, a monoclonal antibody infusion for those with mild to moderate Covid symptoms and at a high risk of hospitalization.  Pt is qualified for this infusion at the [facility] infusion center due to Age > 65, Diabetes and Hypertension currently managed by PCP.     Talked to wife to describe treatment.  I will call back

## 2019-03-10 NOTE — Telephone Encounter (Signed)
  I connected by phone with [name] on 03/10/2019 at 11:52 AM to discuss the potential use of an new treatment for mild to moderate COVID-19 viral infection in non-hospitalized patients.  This patient is a 74 y.o. male that meets the FDA criteria for Emergency Use Authorization of bamlanivimab or casirivimab\imdevimab.  Has a (+) direct SARS-CoV-2 viral test result  Has mild or moderate COVID-19   Is ? 73 years of age and weighs ? 40 kg  Is NOT hospitalized due to COVID-19  Is NOT requiring oxygen therapy or requiring an increase in baseline oxygen flow rate due to COVID-19  Is within 10 days of symptom onset  Has at least one of the high risk factor(s) for progression to severe COVID-19 and/or hospitalization as defined in EUA.  Specific high risk criteria : >/= 73 yo, diabetes and hypertension managed by pcp   I have spoken and communicated the following to the patient or parent/caregiver:  1. FDA has authorized the emergency use of bamlanivimab and casirivimab\imdevimab for the treatment of mild to moderate COVID-19 in adults and pediatric patients with positive results of direct SARS-CoV-2 viral testing who are 67 years of age and older weighing at least 40 kg, and who are at high risk for progressing to severe COVID-19 and/or hospitalization.  2. The significant known and potential risks and benefits of bamlanivimab and casirivimab\imdevimab, and the extent to which such potential risks and benefits are unknown.  3. Information on available alternative treatments and the risks and benefits of those alternatives, including clinical trials.  4. Patients treated with bamlanivimab and casirivimab\imdevimab should continue to self-isolate and use infection control measures (e.g., wear mask, isolate, social distance, avoid sharing personal items, clean and disinfect "high touch" surfaces, and frequent handwashing) according to CDC guidelines.   5. The patient or parent/caregiver has  the option to accept or refuse bamlanivimab or casirivimab\imdevimab .  After reviewing this information with the patient, The patient agreed to proceed with receiving the bamlanimivab infusion and will be provided a copy of the Fact sheet prior to receiving the infusion.[name] 03/10/2019 11:52 AM

## 2019-03-13 ENCOUNTER — Ambulatory Visit (HOSPITAL_COMMUNITY)
Admission: RE | Admit: 2019-03-13 | Discharge: 2019-03-13 | Disposition: A | Discharge status: Home / Self Care | Payer: Medicare Other | Source: Ambulatory Visit | Attending: Pulmonary Disease | Admitting: Pulmonary Disease

## 2019-03-13 DIAGNOSIS — I10 Essential (primary) hypertension: Secondary | ICD-10-CM | POA: Diagnosis not present

## 2019-03-13 DIAGNOSIS — E119 Type 2 diabetes mellitus without complications: Secondary | ICD-10-CM | POA: Insufficient documentation

## 2019-03-13 DIAGNOSIS — Z23 Encounter for immunization: Secondary | ICD-10-CM | POA: Diagnosis not present

## 2019-03-13 DIAGNOSIS — U071 COVID-19: Secondary | ICD-10-CM

## 2019-03-13 MED ORDER — METHYLPREDNISOLONE SODIUM SUCC 125 MG IJ SOLR: 125.0000 mg | Freq: Once | INTRAMUSCULAR | Status: DC | PRN

## 2019-03-13 MED ORDER — SODIUM CHLORIDE 0.9 % IV SOLN
Freq: Once | INTRAVENOUS | Status: AC
  Filled 2019-03-13: qty 10

## 2019-03-13 MED ORDER — SODIUM CHLORIDE 0.9 % IV SOLN
INTRAVENOUS | Status: DC | PRN
  Administered 2019-03-13: 14:00:00 250 mL via INTRAVENOUS

## 2019-03-13 MED ORDER — EPINEPHRINE 0.3 MG/0.3ML IJ SOAJ: 0.3000 mg | Freq: Once | INTRAMUSCULAR | Status: DC | PRN

## 2019-03-13 MED ORDER — FAMOTIDINE IN NACL 20-0.9 MG/50ML-% IV SOLN: 20.0000 mg | Freq: Once | INTRAVENOUS | Status: DC | PRN

## 2019-03-13 MED ORDER — ALBUTEROL SULFATE HFA 108 (90 BASE) MCG/ACT IN AERS: 2.0000 | INHALATION_SPRAY | Freq: Once | RESPIRATORY_TRACT | Status: DC | PRN

## 2019-03-13 MED ORDER — DIPHENHYDRAMINE HCL 50 MG/ML IJ SOLN: 50.0000 mg | Freq: Once | INTRAMUSCULAR | Status: DC | PRN

## 2019-03-13 NOTE — Progress Notes (Signed)
  Diagnosis: COVID-19  Physician: Dr. Delford Field  Procedure: Covid Infusion Clinic Med: casirivimab\imdevimab infusion - Provided patient with casirivimab\imdevimab fact sheet for patients, parents and caregivers prior to infusion.  Complications: No immediate complications noted.  Discharge: Discharged home   [name] 03/13/2019

## 2019-03-13 NOTE — Discharge Instructions (Signed)

## 2019-04-18 ENCOUNTER — Other Ambulatory Visit: Payer: Self-pay

## 2019-04-18 ENCOUNTER — Ambulatory Visit
Admission: EM | Admit: 2019-04-18 | Discharge: 2019-04-18 | Disposition: A | Discharge status: Home / Self Care | Payer: Medicare Other | Attending: Emergency Medicine | Admitting: Emergency Medicine

## 2019-04-18 ENCOUNTER — Encounter: Payer: Self-pay | Admitting: Emergency Medicine

## 2019-04-18 DIAGNOSIS — R05 Cough: Secondary | ICD-10-CM | POA: Diagnosis not present

## 2019-04-18 DIAGNOSIS — R059 Cough, unspecified: Secondary | ICD-10-CM

## 2019-04-18 MED ORDER — BENZONATATE 100 MG PO CAPS: 100.0000 mg | ORAL_CAPSULE | Freq: Three times a day (TID) | ORAL | 0 refills | Status: DC

## 2019-04-18 NOTE — Discharge Instructions (Signed)
Tessalon Perles prescribed Advised patient to follow primary care Return for worsening of symptoms

## 2019-04-18 NOTE — ED Triage Notes (Signed)
Pt presents to UC w/ c/o persistent cough since diagnosis of covid. Pt would like more of the medication which was prescribed at his last visit here for cough. Pt diagnosed w/ covid on 03/09/19
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
## 2019-04-24 ENCOUNTER — Other Ambulatory Visit: Payer: Self-pay

## 2019-04-24 ENCOUNTER — Encounter: Payer: Self-pay | Admitting: Family Medicine

## 2019-04-24 ENCOUNTER — Ambulatory Visit (INDEPENDENT_AMBULATORY_CARE_PROVIDER_SITE_OTHER): Payer: Medicare Other | Admitting: Family Medicine

## 2019-04-24 VITALS — BP 160/80 | HR 84 | Temp 98.9°F | Ht 70.0 in | Wt 211.0 lb

## 2019-04-24 DIAGNOSIS — Z1211 Encounter for screening for malignant neoplasm of colon: Secondary | ICD-10-CM | POA: Insufficient documentation

## 2019-04-24 DIAGNOSIS — E119 Type 2 diabetes mellitus without complications: Secondary | ICD-10-CM | POA: Diagnosis not present

## 2019-04-24 DIAGNOSIS — M25512 Pain in left shoulder: Secondary | ICD-10-CM | POA: Diagnosis not present

## 2019-04-24 DIAGNOSIS — R262 Difficulty in walking, not elsewhere classified: Secondary | ICD-10-CM | POA: Diagnosis not present

## 2019-04-24 DIAGNOSIS — R2689 Other abnormalities of gait and mobility: Secondary | ICD-10-CM

## 2019-04-24 DIAGNOSIS — I10 Essential (primary) hypertension: Secondary | ICD-10-CM | POA: Diagnosis not present

## 2019-04-24 LAB — POCT GLYCOSYLATED HEMOGLOBIN (HGB A1C): Hemoglobin A1C: 7.3 % — AB (ref 4.0–5.6)

## 2019-04-24 NOTE — Progress Notes (Signed)
New Patient Office Visit  Subjective:  Patient ID: [name], male    DOB: 1945/05/15  Age: 74 y.o. MRN: [id number]  CC:  DM/HTN/Hyperlipidemia  HPI [name] presents for DM-metaglip-fasting glucose 130, after dinner 165-175-08/2018-normal exam [facility] Doctor Hyperlipidemia-lipitor daily-no side effects HTN-exforge-no recent change in dosage-160-165/80 home readings Brother died of COVID in 03-20-2022 and pt diagnosed + for COVD -having a difficulty time with loss of brother-last sibling  Past Medical History:  Diagnosis Date  . Arthritis   . Cataract   . Diabetes mellitus without complication ([place])   . Hypertension     History reviewed. No pertinent surgical history.  Family History  Problem Relation Age of Onset  . Diabetes Mother   . Stroke Mother   . Diabetes Father   . Cancer Father   . Diabetes Brother     Social History   Socioeconomic History  . Marital status: Married    Spouse name: Not on file  . Number of children: Not on file  . Years of education: Not on file  . Highest education level: Not on file  Occupational History  . Not on file  Tobacco Use  . Smoking status: Never Smoker  . Smokeless tobacco: Never Used  Substance and Sexual Activity  . Alcohol use: No  . Drug use: No  . Sexual activity: Not Currently  Other Topics Concern  . Not on file  Social History Narrative  . Not on file   Social Determinants of Health   Financial Resource Strain:   . Difficulty of Paying Living Expenses: Not on file  Food Insecurity:   . Worried About Charity fundraiser in the Last Year: Not on file  . Ran Out of Food in the Last Year: Not on file  Transportation Needs:   . Lack of Transportation (Medical): Not on file  . Lack of Transportation (Non-Medical): Not on file  Physical Activity:   . Days of Exercise per Week: Not on file  . Minutes of Exercise per Session: Not on file  Stress:   . Feeling of Stress : Not on file  Social Connections:   .  Frequency of Communication with Friends and Family: Not on file  . Frequency of Social Gatherings with Friends and Family: Not on file  . Attends Religious Services: Not on file  . Active Member of Clubs or Organizations: Not on file  . Attends Archivist Meetings: Not on file  . Marital Status: Not on file  Intimate Partner Violence:   . Fear of Current or Ex-Partner: Not on file  . Emotionally Abused: Not on file  . Physically Abused: Not on file  . Sexually Abused: Not on file    ROS Review of Systems  Constitutional: Negative.   HENT: Negative.   Eyes:       Glasses  Respiratory: Negative.   Cardiovascular: Negative.   Gastrointestinal: Negative.   Endocrine: Negative.   Genitourinary: Negative.        Nocturia  Musculoskeletal: Positive for arthralgias, back pain and myalgias.       Left shoulder, left knee-loss of strength with COVID  Skin: Negative.   Allergic/Immunologic: Negative.   Neurological: Negative.   Hematological: Negative.   Psychiatric/Behavioral: Negative.        Sad currently due to brother dying of COVID    Objective:   Today's Vitals: BP (!) 160/80 (BP Location: Left Arm, Patient Position: Sitting)   Pulse 84   Temp  98.9 F (37.2 C) (Temporal)   Ht 5\' 10"  (1.778 m)   Wt 211 lb (95.7 kg)   SpO2 98%   BMI 30.28 kg/m   Physical Exam Constitutional:      Appearance: Normal appearance.  HENT:     Head: Normocephalic and atraumatic.  Eyes:     Conjunctiva/sclera: Conjunctivae normal.  Cardiovascular:     Rate and Rhythm: Normal rate and regular rhythm.     Pulses: Normal pulses.     Heart sounds: Normal heart sounds.  Pulmonary:     Effort: Pulmonary effort is normal.     Breath sounds: Normal breath sounds.  Musculoskeletal:     Cervical back: Normal range of motion and neck supple.  Neurological:     Mental Status: He is alert and oriented to person, place, and time.  Psychiatric:        Mood and Affect: Mood normal.         Behavior: Behavior normal.     Assessment & Plan:  1. Difficulty walking - Ambulatory referral to Physical Therapy Loss of strength with COVID-fatigue caused less exercise 2. Pain in joint of left shoulder - Ambulatory referral to Physical Therapy Shoulder weakness worse with COVID 3. Balance problems - Ambulatory referral to Physical Therapy Concern for strength and safety 4. Colon cancer screening - Cologuard  5. Diabetes mellitus without complication (HCC) metglip-A1c 7.3, foot exam-weak foot-shoulder-PT referral - Microalbumin, urine - Lipid panel - POCT glycosylated hemoglobin (Hb A1C) Foot exam normal-concern for weakness 6. Hypertension, unspecified type Exforge, check blood pressure readings at home - Microalbumin, urine - Lipid panel - COMPLETE METABOLIC PANEL WITH GFR - TSH Will follow up in 1 month with bp readings-consider additional meds Outpatient Encounter Medications as of 04/24/2019  Medication Sig  . amLODipine-valsartan (EXFORGE) 5-160 MG tablet Take 1 tablet by mouth daily.  06/24/2019 atorvastatin (LIPITOR) 20 MG tablet Take 20 mg by mouth daily.  . benzonatate (TESSALON) 100 MG capsule Take 1 capsule (100 mg total) by mouth every 8 (eight) hours.  Marland Kitchen glipizide-metformin (METAGLIP) 2.5-250 MG tablet Take 1 tablet by mouth 2 (two) times daily before a meal.  . ibuprofen (ADVIL,MOTRIN) 200 MG tablet Take 400 mg by mouth every 6 (six) hours as needed for moderate pain.  . [DISCONTINUED] glyBURIDE-metformin (GLUCOVANCE) 1.25-250 MG per tablet Take 1 tablet by mouth 2 (two) times daily with a meal.    No facility-administered encounter medications on file as of 04/24/2019.    Follow-up: 1 month-HTN-bp readings 65 minutes-obtaining history, discussing COVID diagnosis, COVID vaccine, physical exam, assessment, discuss plan, made referral Katira Dumais 06/24/2019, MD

## 2019-04-24 NOTE — Patient Instructions (Addendum)
Check blood pressure at home first thing in the morning-write it down Bring readings to next appt  Fasting labwork

## 2019-04-27 ENCOUNTER — Encounter: Payer: Self-pay | Admitting: Family Medicine

## 2019-05-02 ENCOUNTER — Other Ambulatory Visit: Payer: Self-pay

## 2019-05-02 ENCOUNTER — Ambulatory Visit (HOSPITAL_COMMUNITY): Payer: Medicare Other | Attending: Family Medicine

## 2019-05-02 ENCOUNTER — Encounter (HOSPITAL_COMMUNITY): Payer: Self-pay

## 2019-05-02 DIAGNOSIS — R262 Difficulty in walking, not elsewhere classified: Secondary | ICD-10-CM | POA: Diagnosis not present

## 2019-05-02 DIAGNOSIS — R2689 Other abnormalities of gait and mobility: Secondary | ICD-10-CM | POA: Diagnosis not present

## 2019-05-02 DIAGNOSIS — M6281 Muscle weakness (generalized): Secondary | ICD-10-CM | POA: Insufficient documentation
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
## 2019-05-04 ENCOUNTER — Ambulatory Visit (HOSPITAL_COMMUNITY): Payer: Medicare Other | Admitting: Physical Therapy

## 2019-05-04 ENCOUNTER — Other Ambulatory Visit: Payer: Self-pay

## 2019-05-04 ENCOUNTER — Encounter (HOSPITAL_COMMUNITY): Payer: Self-pay | Admitting: Physical Therapy

## 2019-05-04 DIAGNOSIS — R2689 Other abnormalities of gait and mobility: Secondary | ICD-10-CM

## 2019-05-04 DIAGNOSIS — R262 Difficulty in walking, not elsewhere classified: Secondary | ICD-10-CM

## 2019-05-04 DIAGNOSIS — M6281 Muscle weakness (generalized): Secondary | ICD-10-CM | POA: Diagnosis not present

## 2019-05-04 NOTE — Patient Instructions (Signed)
Access Code: [email or web address]  URL: [email or web address]  Date: 05/04/2019  Prepared by: Greig Castilla Delrick Dehart   Exercises Standing Hip Abduction with Counter Support - 15 reps - 2 sets - 1x daily - 7x weekly Standing Hip Extension with Counter Support - 15 reps - 2 sets - 1x daily - 7x weekly Seated Heel Toe Raises - 15 reps - 5 second hold - 1x daily - 7x weekly Sit to Stand without Arm Support - 5 reps - 2 sets - 1x daily - 7x weekly
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
## 2019-05-08 DIAGNOSIS — E119 Type 2 diabetes mellitus without complications: Secondary | ICD-10-CM | POA: Diagnosis not present

## 2019-05-08 DIAGNOSIS — I10 Essential (primary) hypertension: Secondary | ICD-10-CM | POA: Diagnosis not present

## 2019-05-09 ENCOUNTER — Ambulatory Visit (HOSPITAL_COMMUNITY): Payer: Medicare Other | Admitting: Physical Therapy

## 2019-05-09 ENCOUNTER — Other Ambulatory Visit: Payer: Self-pay

## 2019-05-09 ENCOUNTER — Telehealth: Payer: Self-pay | Admitting: Emergency Medicine

## 2019-05-09 ENCOUNTER — Encounter: Payer: Self-pay | Admitting: Emergency Medicine

## 2019-05-09 ENCOUNTER — Encounter (HOSPITAL_COMMUNITY): Payer: Self-pay | Admitting: Physical Therapy

## 2019-05-09 DIAGNOSIS — R262 Difficulty in walking, not elsewhere classified: Secondary | ICD-10-CM | POA: Diagnosis not present

## 2019-05-09 DIAGNOSIS — R2689 Other abnormalities of gait and mobility: Secondary | ICD-10-CM

## 2019-05-09 DIAGNOSIS — M6281 Muscle weakness (generalized): Secondary | ICD-10-CM

## 2019-05-09 LAB — COMPLETE METABOLIC PANEL WITH GFR
AG Ratio: 1.5 (calc) (ref 1.0–2.5)
ALT: 24 U/L (ref 9–46)
AST: 24 U/L (ref 10–35)
Albumin: 4 g/dL (ref 3.6–5.1)
Alkaline phosphatase (APISO): 48 U/L (ref 35–144)
BUN: 15 mg/dL (ref 7–25)
CO2: 28 mmol/L (ref 20–32)
Calcium: 9.5 mg/dL (ref 8.6–10.3)
Chloride: 108 mmol/L (ref 98–110)
Creat: 1.15 mg/dL (ref 0.70–1.18)
GFR, Est African American: 73 mL/min/{1.73_m2} (ref >=60)
GFR, Est Non African American: 63 mL/min/{1.73_m2} (ref >=60)
Globulin: 2.6 g/dL (calc) (ref 1.9–3.7)
Glucose, Bld: 164 mg/dL — ABNORMAL HIGH (ref 65–99)
Potassium: 4.7 mmol/L (ref 3.5–5.3)
Sodium: 142 mmol/L (ref 135–146)
Total Bilirubin: 0.8 mg/dL (ref 0.2–1.2)
Total Protein: 6.6 g/dL (ref 6.1–8.1)

## 2019-05-09 LAB — LIPID PANEL
Cholesterol: 201 mg/dL — ABNORMAL HIGH (ref <200)
HDL: 51 mg/dL (ref >=40)
LDL Cholesterol (Calc): 129 mg/dL (calc) — ABNORMAL HIGH
Non-HDL Cholesterol (Calc): 150 mg/dL (calc) — ABNORMAL HIGH (ref <130)
Total CHOL/HDL Ratio: 3.9 (calc) (ref <5.0)
Triglycerides: 105 mg/dL (ref <150)

## 2019-05-09 LAB — MICROALBUMIN, URINE: Microalb, Ur: 0.2 mg/dL

## 2019-05-09 LAB — TSH: TSH: 2.28 mIU/L (ref 0.40–4.50)
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
## 2019-05-09 NOTE — Telephone Encounter (Signed)
-----   Message from Wandra Feinstein, MD sent at 05/09/2019  8:29 AM EDT ----- Your cholesterol is elevated-You need to consider taking a cholesterol medication. We can discuss at your appointment.  Your glucose remains elevated. We can discuss additional medication at your appointment

## 2019-05-09 NOTE — Telephone Encounter (Signed)
Left msg on machine to return calll. When patient return call please inform him of msg below: Your cholesterol is elevated-You need to consider taking a cholesterol medication. We can discuss at your appointment. Your glucose remains elevated. We can discuss additional medication at your appointment

## 2019-05-09 NOTE — Telephone Encounter (Signed)
Lab letter has been mailed 

## 2019-05-11 ENCOUNTER — Other Ambulatory Visit: Payer: Self-pay

## 2019-05-11 ENCOUNTER — Encounter (HOSPITAL_COMMUNITY): Payer: Self-pay | Admitting: Physical Therapy

## 2019-05-11 ENCOUNTER — Ambulatory Visit (HOSPITAL_COMMUNITY): Payer: Medicare Other | Admitting: Physical Therapy

## 2019-05-11 DIAGNOSIS — M6281 Muscle weakness (generalized): Secondary | ICD-10-CM

## 2019-05-11 DIAGNOSIS — R2689 Other abnormalities of gait and mobility: Secondary | ICD-10-CM | POA: Diagnosis not present

## 2019-05-11 DIAGNOSIS — R262 Difficulty in walking, not elsewhere classified: Secondary | ICD-10-CM | POA: Diagnosis not present
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
## 2019-05-16 ENCOUNTER — Ambulatory Visit (HOSPITAL_COMMUNITY): Payer: Medicare Other | Admitting: Physical Therapy

## 2019-05-16 ENCOUNTER — Other Ambulatory Visit: Payer: Self-pay

## 2019-05-16 ENCOUNTER — Encounter (HOSPITAL_COMMUNITY): Payer: Self-pay | Admitting: Physical Therapy

## 2019-05-16 DIAGNOSIS — R262 Difficulty in walking, not elsewhere classified: Secondary | ICD-10-CM | POA: Diagnosis not present

## 2019-05-16 DIAGNOSIS — R2689 Other abnormalities of gait and mobility: Secondary | ICD-10-CM

## 2019-05-16 DIAGNOSIS — M6281 Muscle weakness (generalized): Secondary | ICD-10-CM
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
## 2019-05-18 ENCOUNTER — Ambulatory Visit (HOSPITAL_COMMUNITY): Payer: Medicare Other | Admitting: Physical Therapy

## 2019-05-18 ENCOUNTER — Other Ambulatory Visit: Payer: Self-pay

## 2019-05-18 ENCOUNTER — Encounter (HOSPITAL_COMMUNITY): Payer: Self-pay | Admitting: Physical Therapy

## 2019-05-18 DIAGNOSIS — M6281 Muscle weakness (generalized): Secondary | ICD-10-CM | POA: Diagnosis not present

## 2019-05-18 DIAGNOSIS — R262 Difficulty in walking, not elsewhere classified: Secondary | ICD-10-CM | POA: Diagnosis not present

## 2019-05-18 DIAGNOSIS — R2689 Other abnormalities of gait and mobility: Secondary | ICD-10-CM | POA: Diagnosis not present
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
## 2019-05-23 ENCOUNTER — Ambulatory Visit (HOSPITAL_COMMUNITY): Payer: Medicare Other

## 2019-05-23 ENCOUNTER — Encounter (HOSPITAL_COMMUNITY): Payer: Self-pay

## 2019-05-23 ENCOUNTER — Other Ambulatory Visit: Payer: Self-pay

## 2019-05-23 DIAGNOSIS — M6281 Muscle weakness (generalized): Secondary | ICD-10-CM

## 2019-05-23 DIAGNOSIS — R2689 Other abnormalities of gait and mobility: Secondary | ICD-10-CM

## 2019-05-23 DIAGNOSIS — R262 Difficulty in walking, not elsewhere classified: Secondary | ICD-10-CM
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
## 2019-05-25 ENCOUNTER — Ambulatory Visit (HOSPITAL_COMMUNITY): Payer: Medicare Other | Attending: Family Medicine | Admitting: Physical Therapy

## 2019-05-25 ENCOUNTER — Encounter (HOSPITAL_COMMUNITY): Payer: Self-pay | Admitting: Physical Therapy

## 2019-05-25 ENCOUNTER — Other Ambulatory Visit: Payer: Self-pay

## 2019-05-25 DIAGNOSIS — R262 Difficulty in walking, not elsewhere classified: Secondary | ICD-10-CM

## 2019-05-25 DIAGNOSIS — M6281 Muscle weakness (generalized): Secondary | ICD-10-CM | POA: Diagnosis not present

## 2019-05-25 DIAGNOSIS — R2689 Other abnormalities of gait and mobility: Secondary | ICD-10-CM | POA: Diagnosis not present

## 2019-05-25 DIAGNOSIS — M25512 Pain in left shoulder: Secondary | ICD-10-CM | POA: Insufficient documentation

## 2019-05-25 DIAGNOSIS — M25612 Stiffness of left shoulder, not elsewhere classified: Secondary | ICD-10-CM | POA: Insufficient documentation

## 2019-05-25 DIAGNOSIS — R29898 Other symptoms and signs involving the musculoskeletal system: Secondary | ICD-10-CM | POA: Diagnosis not present
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
## 2019-05-26 ENCOUNTER — Ambulatory Visit (HOSPITAL_COMMUNITY): Payer: Medicare Other | Admitting: Occupational Therapy

## 2019-05-26 ENCOUNTER — Encounter (HOSPITAL_COMMUNITY): Payer: Self-pay | Admitting: Occupational Therapy

## 2019-05-26 DIAGNOSIS — R2689 Other abnormalities of gait and mobility: Secondary | ICD-10-CM | POA: Diagnosis not present

## 2019-05-26 DIAGNOSIS — R29898 Other symptoms and signs involving the musculoskeletal system: Secondary | ICD-10-CM | POA: Diagnosis not present

## 2019-05-26 DIAGNOSIS — M6281 Muscle weakness (generalized): Secondary | ICD-10-CM | POA: Diagnosis not present

## 2019-05-26 DIAGNOSIS — M25612 Stiffness of left shoulder, not elsewhere classified: Secondary | ICD-10-CM

## 2019-05-26 DIAGNOSIS — M25512 Pain in left shoulder: Secondary | ICD-10-CM | POA: Diagnosis not present

## 2019-05-26 DIAGNOSIS — R262 Difficulty in walking, not elsewhere classified: Secondary | ICD-10-CM | POA: Diagnosis not present
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
## 2019-05-26 NOTE — Patient Instructions (Signed)
  1) Flexion Wall Stretch    Face wall, place affected handon wall in front of you. Slide hand up the wall  and lean body in towards the wall. Hold for 10 seconds. Repeat 3-5 times. 1-2 times/day.     2) Towel Stretch with Internal Rotation       Gently pull to the side your affected arm  behind your back with the assist of a towel. Hold 10 seconds, repeat 3-5 times. 1-2 times/day.             3) Corner Stretch    Stand at a corner of a wall, place your arms on the walls with elbows bent. Lean into the corner until a stretch is felt along the front of your chest and/or shoulders. Hold for 10 seconds. Repeat 3-5X, 1-2 times/day.    4) Posterior Capsule Stretch    Bring the involved arm across chest. Grasp elbow and pull toward chest until you feel a stretch in the back of the upper arm and shoulder. Hold 10 seconds. Repeat 3-5X. Complete 1-2 times/day.    5) Scapular Retraction    Tuck chin back as you pinch shoulder blades together.  Hold 5 seconds. Repeat 3-5X. Complete 1-2 times/day.    6) External Rotation Stretch:     Place your affected hand on the wall with the elbow bent and gently turn your body the opposite direction until a stretch is felt. Hold 10 seconds, repeat 3-5X. Complete 1-2 times/day.

## 2019-05-29 ENCOUNTER — Ambulatory Visit (INDEPENDENT_AMBULATORY_CARE_PROVIDER_SITE_OTHER): Payer: Medicare Other | Admitting: Family Medicine

## 2019-05-29 ENCOUNTER — Other Ambulatory Visit: Payer: Self-pay

## 2019-05-29 ENCOUNTER — Encounter: Payer: Self-pay | Admitting: Family Medicine

## 2019-05-29 ENCOUNTER — Other Ambulatory Visit: Payer: Self-pay | Admitting: Family Medicine

## 2019-05-29 VITALS — BP 184/82 | HR 80 | Temp 98.1°F | Wt 219.6 lb

## 2019-05-29 DIAGNOSIS — R2689 Other abnormalities of gait and mobility: Secondary | ICD-10-CM | POA: Diagnosis not present

## 2019-05-29 DIAGNOSIS — E785 Hyperlipidemia, unspecified: Secondary | ICD-10-CM

## 2019-05-29 DIAGNOSIS — E119 Type 2 diabetes mellitus without complications: Secondary | ICD-10-CM | POA: Diagnosis not present

## 2019-05-29 DIAGNOSIS — I10 Essential (primary) hypertension: Secondary | ICD-10-CM | POA: Diagnosis not present

## 2019-05-29 MED ORDER — CYCLOBENZAPRINE HCL 10 MG PO TABS: ORAL_TABLET | ORAL | 0 refills | Status: AC

## 2019-05-29 MED ORDER — AMLODIPINE BESYLATE 5 MG PO TABS: 5.0000 mg | ORAL_TABLET | Freq: Every day | ORAL | 0 refills | Status: DC

## 2019-05-29 NOTE — Patient Instructions (Addendum)
COVID-19 Vaccine Information can be found at: [email or web address] For questions related to vaccine distribution or appointments, please email [email or web address] or call [phone].  Schedule COVID vaccine in 2 weeks-90 days from diagnosis  Continue to take amlodipine/ Valsartan in the morning ADD norvasc 5mg  in the evening Restart lipitor 20mg  daily-recheck lipid panel in 3 months Pt to eat 3 meals /day Flexeril-use in the evening

## 2019-05-29 NOTE — Progress Notes (Signed)
Established Patient Office Visit  Subjective:  Patient ID: [name], male    DOB: [birth year]  Age: 74 y.o. MRN: [id number]  CC:  Chief Complaint  Patient presents with  . Hypertension    f/u    HPI [name] presents for HTN/DM-pt states elevated blood pressure-amlodipine/valsartan-low glucose -pt eats three meals /day. Pt states elevated blood pressure in the morning 157/83-135/79. Pt states no dizziness.  Pt took atorvastatin in the past-stopped taking medication 1/21 while he had COVID.  Past Medical History:  Diagnosis Date  . Arthritis   . Cataract   . Diabetes mellitus without complication (HCC)   . Hypertension     Family History  Problem Relation Age of Onset  . Diabetes Mother   . Stroke Mother   . Diabetes Father   . Cancer Father   . Diabetes Brother     Social History   Socioeconomic History  . Marital status: Married    Spouse name: Not on file  . Number of children: Not on file  . Years of education: Not on file  . Highest education level: Not on file  Occupational History  . Not on file  Tobacco Use  . Smoking status: Never Smoker  . Smokeless tobacco: Never Used  Substance and Sexual Activity  . Alcohol use: No  . Drug use: No  . Sexual activity: Not Currently  Other Topics Concern  . Not on file  Social History Narrative  . Not on file   Social Determinants of Health   Financial Resource Strain:   . Difficulty of Paying Living Expenses:   Food Insecurity:   . Worried About Programme researcher, broadcasting/film/video in the Last Year:   . Barista in the Last Year:   Transportation Needs:   . Freight forwarder (Medical):   Marland Kitchen Lack of Transportation (Non-Medical):   Physical Activity:   . Days of Exercise per Week:   . Minutes of Exercise per Session:   Stress:   . Feeling of Stress :   Social Connections:   . Frequency of Communication with Friends and Family:   . Frequency of Social Gatherings with Friends and Family:   . Attends  Religious Services:   . Active Member of Clubs or Organizations:   . Attends Banker Meetings:   Marland Kitchen Marital Status:   Intimate Partner Violence:   . Fear of Current or Ex-Partner:   . Emotionally Abused:   Marland Kitchen Physically Abused:   . Sexually Abused:     Outpatient Medications Prior to Visit  Medication Sig Dispense Refill  . amLODipine-valsartan (EXFORGE) 5-160 MG tablet Take 1 tablet by mouth daily.    Marland Kitchen atorvastatin (LIPITOR) 20 MG tablet Take 20 mg by mouth daily.    Marland Kitchen glipizide-metformin (METAGLIP) 2.5-250 MG tablet Take 1 tablet by mouth 2 (two) times daily before a meal.    . ibuprofen (ADVIL,MOTRIN) 200 MG tablet Take 400 mg by mouth every 6 (six) hours as needed for moderate pain.    . benzonatate (TESSALON) 100 MG capsule Take 1 capsule (100 mg total) by mouth every 8 (eight) hours. 42 capsule 0   No facility-administered medications prior to visit.    Allergies  Allergen Reactions  . Ciprofloxacin     sweating    ROS Review of Systems  Constitutional: Negative.   Respiratory: Negative.        COVID 1/21  Cardiovascular: Negative.   Endocrine:  Diabetes  Musculoskeletal: Positive for arthralgias.       Therapy-pt has improved  Neurological: Negative.   Psychiatric/Behavioral: Negative.       Objective:    Physical Exam  Constitutional: He is oriented to person, place, and time. He appears well-developed and well-nourished.  HENT:  Head: Normocephalic and atraumatic.  Eyes: Conjunctivae are normal.  Cardiovascular: Regular rhythm.  Pulmonary/Chest: Effort normal and breath sounds normal.  Musculoskeletal:        General: Edema present.     Cervical back: Normal range of motion and neck supple.     Comments: 1+ bilat LE  Neurological: He is oriented to person, place, and time.  Psychiatric: He has a normal mood and affect. His behavior is normal.    BP (!) 184/82 (BP Location: Right Arm, Patient Position: Sitting)   Pulse 80   Temp  98.1 F (36.7 C) (Temporal)   Wt 219 lb 9.6 oz (99.6 kg)   SpO2 96%   BMI 31.51 kg/m  Wt Readings from Last 3 Encounters:  05/29/19 219 lb 9.6 oz (99.6 kg)  04/24/19 211 lb (95.7 kg)  05/06/15 217 lb (98.4 kg)     Health Maintenance Due  Topic Date Due  . Hepatitis C Screening  Never done  . OPHTHALMOLOGY EXAM  Never done  . COLONOSCOPY  Never done  . PNA vac Low Risk Adult (1 of 2 - PCV13) Never done     Lab Results  Component Value Date   TSH 2.28 05/08/2019   Lab Results  Component Value Date   WBC 4.1 10/24/2013   HGB 11.3 (L) 10/24/2013   HCT 34.7 (L) 10/24/2013   MCV 83.4 10/24/2013   PLT 187 10/24/2013   Lab Results  Component Value Date   NA 142 05/08/2019   K 4.7 05/08/2019   CO2 28 05/08/2019   GLUCOSE 164 (H) 05/08/2019   BUN 15 05/08/2019   CREATININE 1.15 05/08/2019   BILITOT 0.8 05/08/2019   ALKPHOS 71 10/02/2013   AST 24 05/08/2019   ALT 24 05/08/2019   PROT 6.6 05/08/2019   ALBUMIN 3.0 (L) 10/02/2013   CALCIUM 9.5 05/08/2019   ANIONGAP 10 10/24/2013   Lab Results  Component Value Date   CHOL 201 (H) 05/08/2019   Lab Results  Component Value Date   HDL 51 05/08/2019   Lab Results  Component Value Date   LDLCALC 129 (H) 05/08/2019   Lab Results  Component Value Date   TRIG 105 05/08/2019   Lab Results  Component Value Date   CHOLHDL 3.9 05/08/2019   Lab Results  Component Value Date   HGBA1C 7.3 (A) 04/24/2019      Assessment & Plan:  1. Essential hypertension Amlodipine/valsartan -take in the morning, add amlodipine 5mg  -take in the evening-goal 130/80 Renal function normal, microalb normal  2. Diabetes mellitus without complication (HCC) Glipizide/metformin 2.5/250-continue twice a day A1c-7.3%, GFR 89 3. Balance problems Pt with PT -helped balance  4. Hyperlipidemia, unspecified hyperlipidemia type Restart Lipitor daily-rx-recheck in 3 months-goal LDL 70 Follow-up: 1 month-recheck HTN, 3 months DM Send  cologuard for specimen  Braelyn Jenson Hannah Beat, MD

## 2019-05-29 NOTE — Telephone Encounter (Signed)
Please advise 

## 2019-05-30 ENCOUNTER — Telehealth: Payer: Self-pay | Admitting: Emergency Medicine

## 2019-05-30 ENCOUNTER — Encounter (HOSPITAL_COMMUNITY): Payer: Self-pay

## 2019-05-30 ENCOUNTER — Ambulatory Visit (HOSPITAL_COMMUNITY): Payer: Medicare Other

## 2019-05-30 DIAGNOSIS — R29898 Other symptoms and signs involving the musculoskeletal system: Secondary | ICD-10-CM | POA: Diagnosis not present

## 2019-05-30 DIAGNOSIS — M6281 Muscle weakness (generalized): Secondary | ICD-10-CM

## 2019-05-30 DIAGNOSIS — R262 Difficulty in walking, not elsewhere classified: Secondary | ICD-10-CM | POA: Diagnosis not present

## 2019-05-30 DIAGNOSIS — R2689 Other abnormalities of gait and mobility: Secondary | ICD-10-CM

## 2019-05-30 DIAGNOSIS — M25512 Pain in left shoulder: Secondary | ICD-10-CM | POA: Diagnosis not present

## 2019-05-30 DIAGNOSIS — M25612 Stiffness of left shoulder, not elsewhere classified: Secondary | ICD-10-CM | POA: Diagnosis not present
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
## 2019-05-30 NOTE — Telephone Encounter (Signed)
Ft a msg on patient's machine to see why he is taking the cyclobenzaprine at night? The insurance plan he is on stated this is not covered by ur ins. I need to know why he is on this med and I may can resubmit it under the proper diagnosis. But if he is taking this for sleep it will not be covered at all for sleep

## 2019-05-31 ENCOUNTER — Encounter (HOSPITAL_COMMUNITY): Payer: Self-pay

## 2019-05-31 ENCOUNTER — Ambulatory Visit (HOSPITAL_COMMUNITY): Payer: Medicare Other

## 2019-05-31 ENCOUNTER — Other Ambulatory Visit: Payer: Self-pay

## 2019-05-31 DIAGNOSIS — R262 Difficulty in walking, not elsewhere classified: Secondary | ICD-10-CM | POA: Diagnosis not present

## 2019-05-31 DIAGNOSIS — R2689 Other abnormalities of gait and mobility: Secondary | ICD-10-CM | POA: Diagnosis not present

## 2019-05-31 DIAGNOSIS — M25612 Stiffness of left shoulder, not elsewhere classified: Secondary | ICD-10-CM | POA: Diagnosis not present

## 2019-05-31 DIAGNOSIS — M25512 Pain in left shoulder: Secondary | ICD-10-CM | POA: Diagnosis not present

## 2019-05-31 DIAGNOSIS — M6281 Muscle weakness (generalized): Secondary | ICD-10-CM | POA: Diagnosis not present

## 2019-05-31 DIAGNOSIS — R29898 Other symptoms and signs involving the musculoskeletal system: Secondary | ICD-10-CM

## 2019-05-31 NOTE — Patient Instructions (Signed)
Repeat all exercises 10-15 times, 1-2 times per day.  1) Shoulder Protraction    Begin with elbows by your side, slowly "punch" straight out in front of you.      2) Shoulder Flexion  Standing:         Begin with arms at your side with thumbs pointed up, slowly raise both arms up and forward towards overhead.               3) Horizontal abduction/adduction   Standing:           Begin with arms straight out in front of you, bring out to the side in at "T" shape. Keep arms straight entire time.       4) Internal & External Rotation  Standing:     Stand with elbows at the side and elbows bent 90 degrees. Move your forearms away from your body, then bring back inward toward the body.     5) Shoulder Abduction  Standing:       Begin with your arms next to your side. Slowly move your arms out to the side so that they go overhead, in a jumping jack or snow angel movement.      

## 2019-06-01 ENCOUNTER — Ambulatory Visit (HOSPITAL_COMMUNITY): Payer: Medicare Other | Admitting: Physical Therapy

## 2019-06-01 ENCOUNTER — Encounter (HOSPITAL_COMMUNITY): Payer: Self-pay | Admitting: Physical Therapy

## 2019-06-01 DIAGNOSIS — M6281 Muscle weakness (generalized): Secondary | ICD-10-CM

## 2019-06-01 DIAGNOSIS — M25612 Stiffness of left shoulder, not elsewhere classified: Secondary | ICD-10-CM | POA: Diagnosis not present

## 2019-06-01 DIAGNOSIS — R2689 Other abnormalities of gait and mobility: Secondary | ICD-10-CM | POA: Diagnosis not present

## 2019-06-01 DIAGNOSIS — R262 Difficulty in walking, not elsewhere classified: Secondary | ICD-10-CM

## 2019-06-01 DIAGNOSIS — M25512 Pain in left shoulder: Secondary | ICD-10-CM | POA: Diagnosis not present

## 2019-06-01 DIAGNOSIS — R29898 Other symptoms and signs involving the musculoskeletal system: Secondary | ICD-10-CM | POA: Diagnosis not present
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
## 2019-06-02 ENCOUNTER — Encounter (HOSPITAL_COMMUNITY): Payer: Self-pay | Admitting: Occupational Therapy

## 2019-06-02 ENCOUNTER — Other Ambulatory Visit: Payer: Self-pay

## 2019-06-02 ENCOUNTER — Ambulatory Visit (HOSPITAL_COMMUNITY): Payer: Medicare Other | Admitting: Occupational Therapy

## 2019-06-02 DIAGNOSIS — M25612 Stiffness of left shoulder, not elsewhere classified: Secondary | ICD-10-CM | POA: Diagnosis not present

## 2019-06-02 DIAGNOSIS — R2689 Other abnormalities of gait and mobility: Secondary | ICD-10-CM | POA: Diagnosis not present

## 2019-06-02 DIAGNOSIS — M25512 Pain in left shoulder: Secondary | ICD-10-CM | POA: Diagnosis not present

## 2019-06-02 DIAGNOSIS — R29898 Other symptoms and signs involving the musculoskeletal system: Secondary | ICD-10-CM | POA: Diagnosis not present

## 2019-06-02 DIAGNOSIS — M6281 Muscle weakness (generalized): Secondary | ICD-10-CM | POA: Diagnosis not present

## 2019-06-02 DIAGNOSIS — R262 Difficulty in walking, not elsewhere classified: Secondary | ICD-10-CM | POA: Diagnosis not present
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
## 2019-06-06 ENCOUNTER — Other Ambulatory Visit: Payer: Self-pay

## 2019-06-06 ENCOUNTER — Encounter (HOSPITAL_COMMUNITY): Payer: Self-pay

## 2019-06-06 ENCOUNTER — Ambulatory Visit (HOSPITAL_COMMUNITY): Payer: Medicare Other

## 2019-06-06 ENCOUNTER — Encounter (HOSPITAL_COMMUNITY): Payer: Self-pay | Admitting: Occupational Therapy

## 2019-06-06 ENCOUNTER — Ambulatory Visit (HOSPITAL_COMMUNITY): Payer: Medicare Other | Admitting: Occupational Therapy

## 2019-06-06 DIAGNOSIS — R2689 Other abnormalities of gait and mobility: Secondary | ICD-10-CM | POA: Diagnosis not present

## 2019-06-06 DIAGNOSIS — M25612 Stiffness of left shoulder, not elsewhere classified: Secondary | ICD-10-CM

## 2019-06-06 DIAGNOSIS — R262 Difficulty in walking, not elsewhere classified: Secondary | ICD-10-CM | POA: Diagnosis not present

## 2019-06-06 DIAGNOSIS — M6281 Muscle weakness (generalized): Secondary | ICD-10-CM | POA: Diagnosis not present

## 2019-06-06 DIAGNOSIS — M25512 Pain in left shoulder: Secondary | ICD-10-CM

## 2019-06-06 DIAGNOSIS — R29898 Other symptoms and signs involving the musculoskeletal system: Secondary | ICD-10-CM
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
## 2019-06-06 NOTE — Patient Instructions (Signed)
Repeat all exercises 10-15 times, 1-2 times per day. Use a 1lb weight for strengthening the left shoulder.   1) Shoulder Protraction    Begin with elbows by your side, slowly "punch" straight out in front of you.      2) Shoulder Flexion  Supine:     Standing:         Begin with arms at your side with thumbs pointed up, slowly raise both arms up and forward towards overhead.               3) Horizontal abduction/adduction  Supine:   Standing:           Begin with arms straight out in front of you, bring out to the side in at "T" shape. Keep arms straight entire time.                 4) Internal & External Rotation   Supine:     Standing:     Stand with elbows at the side and elbows bent 90 degrees. Move your forearms away from your body, then bring back inward toward the body.     5) Shoulder Abduction  Supine:     Standing:       Lying on your back begin with your arms flat on the table next to your side. Slowly move your arms out to the side so that they go overhead, in a jumping jack or snow angel movement.

## 2019-06-08 ENCOUNTER — Ambulatory Visit (HOSPITAL_COMMUNITY): Payer: Medicare Other | Admitting: Physical Therapy

## 2019-06-08 ENCOUNTER — Other Ambulatory Visit: Payer: Self-pay

## 2019-06-08 DIAGNOSIS — R29898 Other symptoms and signs involving the musculoskeletal system: Secondary | ICD-10-CM | POA: Diagnosis not present

## 2019-06-08 DIAGNOSIS — R262 Difficulty in walking, not elsewhere classified: Secondary | ICD-10-CM

## 2019-06-08 DIAGNOSIS — M25512 Pain in left shoulder: Secondary | ICD-10-CM | POA: Diagnosis not present

## 2019-06-08 DIAGNOSIS — M25612 Stiffness of left shoulder, not elsewhere classified: Secondary | ICD-10-CM | POA: Diagnosis not present

## 2019-06-08 DIAGNOSIS — R2689 Other abnormalities of gait and mobility: Secondary | ICD-10-CM | POA: Diagnosis not present

## 2019-06-08 DIAGNOSIS — M6281 Muscle weakness (generalized): Secondary | ICD-10-CM | POA: Diagnosis not present
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
## 2019-06-09 ENCOUNTER — Ambulatory Visit (HOSPITAL_COMMUNITY): Payer: Medicare Other | Admitting: Occupational Therapy

## 2019-06-09 DIAGNOSIS — R29898 Other symptoms and signs involving the musculoskeletal system: Secondary | ICD-10-CM

## 2019-06-09 DIAGNOSIS — M25512 Pain in left shoulder: Secondary | ICD-10-CM

## 2019-06-09 DIAGNOSIS — M25612 Stiffness of left shoulder, not elsewhere classified: Secondary | ICD-10-CM | POA: Diagnosis not present

## 2019-06-09 DIAGNOSIS — R262 Difficulty in walking, not elsewhere classified: Secondary | ICD-10-CM | POA: Diagnosis not present

## 2019-06-09 DIAGNOSIS — M6281 Muscle weakness (generalized): Secondary | ICD-10-CM | POA: Diagnosis not present

## 2019-06-09 DIAGNOSIS — R2689 Other abnormalities of gait and mobility: Secondary | ICD-10-CM | POA: Diagnosis not present
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
## 2019-06-13 ENCOUNTER — Encounter (HOSPITAL_COMMUNITY): Payer: Self-pay

## 2019-06-13 ENCOUNTER — Ambulatory Visit (HOSPITAL_COMMUNITY): Payer: Medicare Other

## 2019-06-13 ENCOUNTER — Other Ambulatory Visit: Payer: Self-pay

## 2019-06-13 DIAGNOSIS — M6281 Muscle weakness (generalized): Secondary | ICD-10-CM | POA: Diagnosis not present

## 2019-06-13 DIAGNOSIS — R29898 Other symptoms and signs involving the musculoskeletal system: Secondary | ICD-10-CM

## 2019-06-13 DIAGNOSIS — M25612 Stiffness of left shoulder, not elsewhere classified: Secondary | ICD-10-CM

## 2019-06-13 DIAGNOSIS — R2689 Other abnormalities of gait and mobility: Secondary | ICD-10-CM | POA: Diagnosis not present

## 2019-06-13 DIAGNOSIS — M25512 Pain in left shoulder: Secondary | ICD-10-CM | POA: Diagnosis not present

## 2019-06-13 DIAGNOSIS — R262 Difficulty in walking, not elsewhere classified: Secondary | ICD-10-CM | POA: Diagnosis not present

## 2019-06-13 NOTE — Patient Instructions (Signed)
Complete the following exercises 2-3 times a day.   Internal Rotation Across Back  Grab the end of a towel with your affected side, palm facing backwards. Grab the towel with your unaffected side and pull your affected hand across your back until you feel a stretch in the front of your shoulder. If you feel pain, pull just to the pain, do not pull through the pain. Hold. Return your affected arm to your side. Try to keep your hand/arm close to your body during the entire movement.     Hold for 30 seconds. Complete 2 times.        Posterior Capsule Stretch   Stand or sit, one arm across body so hand rests over opposite shoulder. Gently push on crossed elbow with other hand until stretch is felt in shoulder of crossed arm. Hold _30__ seconds.  Repeat _2__ times per session.    Wall Flexion  Slide your arm up the wall or door frame until a stretch is felt in your shoulder . Hold for 30 seconds. Complete 2 times     Shoulder Abduction Stretch  Stand side ways by a wall with affected up on wall. Gently step in toward wall to feel stretch. Hold for 30 seconds. Complete 2 times.
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
## 2019-06-16 ENCOUNTER — Other Ambulatory Visit: Payer: Self-pay

## 2019-06-16 ENCOUNTER — Ambulatory Visit (HOSPITAL_COMMUNITY): Payer: Medicare Other | Admitting: Occupational Therapy

## 2019-06-16 ENCOUNTER — Encounter (HOSPITAL_COMMUNITY): Payer: Self-pay | Admitting: Occupational Therapy

## 2019-06-16 DIAGNOSIS — M25612 Stiffness of left shoulder, not elsewhere classified: Secondary | ICD-10-CM | POA: Diagnosis not present

## 2019-06-16 DIAGNOSIS — R29898 Other symptoms and signs involving the musculoskeletal system: Secondary | ICD-10-CM

## 2019-06-16 DIAGNOSIS — M25512 Pain in left shoulder: Secondary | ICD-10-CM | POA: Diagnosis not present

## 2019-06-16 DIAGNOSIS — M6281 Muscle weakness (generalized): Secondary | ICD-10-CM | POA: Diagnosis not present

## 2019-06-16 DIAGNOSIS — R262 Difficulty in walking, not elsewhere classified: Secondary | ICD-10-CM | POA: Diagnosis not present

## 2019-06-16 DIAGNOSIS — R2689 Other abnormalities of gait and mobility: Secondary | ICD-10-CM | POA: Diagnosis not present
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
## 2019-06-20 ENCOUNTER — Encounter (HOSPITAL_COMMUNITY): Payer: Self-pay | Admitting: Occupational Therapy

## 2019-06-20 ENCOUNTER — Other Ambulatory Visit: Payer: Self-pay

## 2019-06-20 ENCOUNTER — Ambulatory Visit (HOSPITAL_COMMUNITY): Payer: Medicare Other | Admitting: Occupational Therapy

## 2019-06-20 DIAGNOSIS — R29898 Other symptoms and signs involving the musculoskeletal system: Secondary | ICD-10-CM | POA: Diagnosis not present

## 2019-06-20 DIAGNOSIS — M6281 Muscle weakness (generalized): Secondary | ICD-10-CM | POA: Diagnosis not present

## 2019-06-20 DIAGNOSIS — M25512 Pain in left shoulder: Secondary | ICD-10-CM | POA: Diagnosis not present

## 2019-06-20 DIAGNOSIS — M25612 Stiffness of left shoulder, not elsewhere classified: Secondary | ICD-10-CM

## 2019-06-20 DIAGNOSIS — R2689 Other abnormalities of gait and mobility: Secondary | ICD-10-CM | POA: Diagnosis not present

## 2019-06-20 DIAGNOSIS — R262 Difficulty in walking, not elsewhere classified: Secondary | ICD-10-CM | POA: Diagnosis not present
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
## 2019-06-23 ENCOUNTER — Ambulatory Visit (HOSPITAL_COMMUNITY): Payer: Medicare Other | Admitting: Occupational Therapy

## 2019-06-23 ENCOUNTER — Other Ambulatory Visit: Payer: Self-pay

## 2019-06-23 ENCOUNTER — Encounter (HOSPITAL_COMMUNITY): Payer: Self-pay | Admitting: Occupational Therapy

## 2019-06-23 DIAGNOSIS — M6281 Muscle weakness (generalized): Secondary | ICD-10-CM | POA: Diagnosis not present

## 2019-06-23 DIAGNOSIS — M25512 Pain in left shoulder: Secondary | ICD-10-CM | POA: Diagnosis not present

## 2019-06-23 DIAGNOSIS — R2689 Other abnormalities of gait and mobility: Secondary | ICD-10-CM | POA: Diagnosis not present

## 2019-06-23 DIAGNOSIS — R29898 Other symptoms and signs involving the musculoskeletal system: Secondary | ICD-10-CM

## 2019-06-23 DIAGNOSIS — M25612 Stiffness of left shoulder, not elsewhere classified: Secondary | ICD-10-CM

## 2019-06-23 DIAGNOSIS — R262 Difficulty in walking, not elsewhere classified: Secondary | ICD-10-CM | POA: Diagnosis not present
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
## 2019-06-23 NOTE — Patient Instructions (Signed)
1) Strengthening: Chest Pull - Resisted   Hold Theraband in front of body with hands about shoulder width a part. Pull band a part and back together slowly. Repeat __10__ times. Complete __1__ set(s) per session.. Repeat _1___ session(s) per day.  http://orth.exer.us/926   Copyright  VHI. All rights reserved.   2) PNF Strengthening: Resisted   Standing with resistive band around each hand, bring right arm up and away, thumb back. Repeat __10__ times per set. Do __1__ sets per session. Do __1__ sessions per day.      3) Resisted External Rotation: in Neutral - Bilateral   Sit or stand, tubing in both hands, elbows at sides, bent to 90, forearms forward. Pinch shoulder blades together and rotate forearms out. Keep elbows at sides. Repeat __10__ times per set. Do __1__ sets per session. Do __1__ sessions per day.  http://orth.exer.us/966   Copyright  VHI. All rights reserved.   4) PNF Strengthening: Resisted   Standing, hold resistive band above head. Bring right arm down and out from side. Repeat __10__ times per set. Do __1__ sets per session. Do ___1_ sessions per day.  http://orth.exer.us/922   Copyright  VHI. All rights reserved.   

## 2019-06-25 ENCOUNTER — Other Ambulatory Visit: Payer: Self-pay | Admitting: Family Medicine

## 2019-06-26 ENCOUNTER — Other Ambulatory Visit: Payer: Self-pay

## 2019-06-26 ENCOUNTER — Other Ambulatory Visit: Payer: Self-pay | Admitting: Family Medicine

## 2019-06-28 ENCOUNTER — Ambulatory Visit (INDEPENDENT_AMBULATORY_CARE_PROVIDER_SITE_OTHER): Payer: Medicare Other | Admitting: Family Medicine

## 2019-06-28 ENCOUNTER — Other Ambulatory Visit: Payer: Self-pay

## 2019-06-28 DIAGNOSIS — I10 Essential (primary) hypertension: Secondary | ICD-10-CM | POA: Diagnosis not present

## 2019-06-28 DIAGNOSIS — E785 Hyperlipidemia, unspecified: Secondary | ICD-10-CM | POA: Diagnosis not present

## 2019-06-28 DIAGNOSIS — Z23 Encounter for immunization: Secondary | ICD-10-CM | POA: Diagnosis not present

## 2019-06-28 DIAGNOSIS — E119 Type 2 diabetes mellitus without complications: Secondary | ICD-10-CM | POA: Diagnosis not present

## 2019-06-29 NOTE — Progress Notes (Signed)
Established Patient Office Visit  Subjective:  Patient ID: [name], male    DOB: 20-Feb-1946  Age: 74 y.o. MRN: [id number]  CC:  Chief Complaint  Patient presents with  . Hypertension    1 month f/u    HPI [name] presents for HTN-taking Norvasc 5mg  + Exforge 5/160mg -splits taking one at night and one in the morning Hyperlipidemia-pt was not taking but has re-started-LDL 129 DM-glucose 130 at home today-A1c 7.3% 3/21-taking BID glyburide/metformin Pt had COVID in Jan-now able to walk 1/2 mile-brother died from [place] feels that PT has improved balance, endurance since COVID an ROM with shoulders  Past Medical History:  Diagnosis Date  . Arthritis   . Cataract   . Diabetes mellitus without complication ([place])   . Hypertension     Family History  Problem Relation Age of Onset  . Diabetes Mother   . Stroke Mother   . Diabetes Father   . Cancer Father   . Diabetes Brother     Social History   Socioeconomic History  . Marital status: Married    Spouse name: Not on file  . Number of children: Not on file  . Years of education: Not on file  . Highest education level: Not on file  Occupational History  . Not on file  Tobacco Use  . Smoking status: Never Smoker  . Smokeless tobacco: Never Used  Substance and Sexual Activity  . Alcohol use: No  . Drug use: No  . Sexual activity: Not Currently  Other Topics Concern  . Not on file  Social History Narrative  . Not on file   Social Determinants of Health   Financial Resource Strain:   . Difficulty of Paying Living Expenses:   Food Insecurity:   . Worried About Charity fundraiser in the Last Year:   . Arboriculturist in the Last Year:   Transportation Needs:   . Film/video editor (Medical):   Marland Kitchen Lack of Transportation (Non-Medical):   Physical Activity:   . Days of Exercise per Week:   . Minutes of Exercise per Session:   Stress:   . Feeling of Stress :   Social Connections:   . Frequency  of Communication with Friends and Family:   . Frequency of Social Gatherings with Friends and Family:   . Attends Religious Services:   . Active Member of Clubs or Organizations:   . Attends Archivist Meetings:   Marland Kitchen Marital Status:   Intimate Partner Violence:   . Fear of Current or Ex-Partner:   . Emotionally Abused:   Marland Kitchen Physically Abused:   . Sexually Abused:     Outpatient Medications Prior to Visit  Medication Sig Dispense Refill  . amLODipine (NORVASC) 5 MG tablet TAKE 1 TABLET(5 MG) BY MOUTH DAILY 90 tablet 1  . amLODipine-valsartan (EXFORGE) 5-160 MG tablet Take 1 tablet by mouth daily.    Marland Kitchen atorvastatin (LIPITOR) 20 MG tablet Take 20 mg by mouth daily.    . cyclobenzaprine (FLEXERIL) 10 MG tablet Take 1/2-1 po qhs 30 tablet 0  . glipizide-metformin (METAGLIP) 2.5-250 MG tablet Take 1 tablet by mouth 2 (two) times daily before a meal.    . glucose blood test strip FreeStyle Lite Strips    . ibuprofen (ADVIL,MOTRIN) 200 MG tablet Take 400 mg by mouth every 6 (six) hours as needed for moderate pain.     No facility-administered medications prior to visit.    Allergies  Allergen Reactions  . Ciprofloxacin     sweating    ROS Review of Systems  Constitutional: Negative.   Eyes: Negative.   Respiratory: Negative.   Cardiovascular: Negative.   Gastrointestinal: Negative.   Genitourinary: Negative.   Skin: Negative.   Neurological: Negative.   Hematological: Negative.   Psychiatric/Behavioral: Negative.       Objective:    Physical Exam  Constitutional: He is oriented to person, place, and time. He appears well-developed and well-nourished.  HENT:  Head: Normocephalic and atraumatic.  Eyes: Conjunctivae are normal.  Cardiovascular: Normal rate, regular rhythm and normal heart sounds.  Musculoskeletal:        General: Normal range of motion.  Neurological: He is alert and oriented to person, place, and time.  Psychiatric: He has a normal mood and  affect. His behavior is normal.    There were no vitals taken for this visit. Wt Readings from Last 3 Encounters:  05/29/19 219 lb 9.6 oz (99.6 kg)  04/24/19 211 lb (95.7 kg)  05/06/15 217 lb (98.4 kg)     Health Maintenance Due  Topic Date Due  . Hepatitis C Screening  Never done  . OPHTHALMOLOGY EXAM  Never done  . COVID-19 Vaccine (1) Never done  . COLONOSCOPY  Never done  . PNA vac Low Risk Adult (1 of 2 - PCV13) Never done   Lab Results  Component Value Date   TSH 2.28 05/08/2019   Lab Results  Component Value Date   WBC 4.1 10/24/2013   HGB 11.3 (L) 10/24/2013   HCT 34.7 (L) 10/24/2013   MCV 83.4 10/24/2013   PLT 187 10/24/2013   Lab Results  Component Value Date   NA 142 05/08/2019   K 4.7 05/08/2019   CO2 28 05/08/2019   GLUCOSE 164 (H) 05/08/2019   BUN 15 05/08/2019   CREATININE 1.15 05/08/2019   BILITOT 0.8 05/08/2019   ALKPHOS 71 10/02/2013   AST 24 05/08/2019   ALT 24 05/08/2019   PROT 6.6 05/08/2019   ALBUMIN 3.0 (L) 10/02/2013   CALCIUM 9.5 05/08/2019   ANIONGAP 10 10/24/2013   Lab Results  Component Value Date   CHOL 201 (H) 05/08/2019   Lab Results  Component Value Date   HDL 51 05/08/2019   Lab Results  Component Value Date   LDLCALC 129 (H) 05/08/2019   Lab Results  Component Value Date   TRIG 105 05/08/2019   Lab Results  Component Value Date   CHOLHDL 3.9 05/08/2019   Lab Results  Component Value Date   HGBA1C 7.3 (A) 04/24/2019      Assessment & Plan:  1. Essential hypertension Amlodipine-take at night exforge-take in the morning Trying to split dose to improve morning blood pressure - COMPLETE METABOLIC PANEL WITH GFR  2. Diabetes mellitus without complication (HCC) Check glucose every morning-take with food glyburide-risk of low glucose discussed with higher dose - Hemoglobin A1c  3. Hyperlipidemia, unspecified hyperlipidemia type Atorvastatin-recheck lipid panel back on medication - Lipid panel Problem  List Items Addressed This Visit      Cardiovascular and Mediastinum   Essential hypertension - Primary   Relevant Orders   COMPLETE METABOLIC PANEL WITH GFR     Endocrine   Diabetes mellitus without complication (HCC) (Chronic)   Relevant Orders   Hemoglobin A1c     Other   Hyperlipidemia   Relevant Orders   Lipid panel       Follow-up: fasting labwork   Mahi Zabriskie LEIGH Sareena Odeh,  MD

## 2019-07-18 ENCOUNTER — Telehealth: Payer: Self-pay | Admitting: Family Medicine

## 2019-07-18 ENCOUNTER — Other Ambulatory Visit: Payer: Self-pay

## 2019-07-18 DIAGNOSIS — I10 Essential (primary) hypertension: Secondary | ICD-10-CM

## 2019-07-18 DIAGNOSIS — E785 Hyperlipidemia, unspecified: Secondary | ICD-10-CM

## 2019-07-18 DIAGNOSIS — E119 Type 2 diabetes mellitus without complications: Secondary | ICD-10-CM

## 2019-07-18 MED ORDER — AMLODIPINE BESYLATE-VALSARTAN 5-160 MG PO TABS: 1.0000 | ORAL_TABLET | Freq: Every day | ORAL | 1 refills | Status: AC

## 2019-07-18 MED ORDER — GLIPIZIDE-METFORMIN HCL 2.5-250 MG PO TABS: 1.0000 | ORAL_TABLET | Freq: Two times a day (BID) | ORAL | 1 refills | Status: AC

## 2019-07-18 MED ORDER — ATORVASTATIN CALCIUM 20 MG PO TABS: 20.0000 mg | ORAL_TABLET | Freq: Every day | ORAL | 1 refills | Status: AC

## 2019-07-18 NOTE — Telephone Encounter (Signed)
Patient came in today and is requesting a 3 month supply on the following medications.   glipizide-metformin (METAGLIP) 2.5-250 MG tablet  amLODipine-valsartan (EXFORGE) 5-160 MG tablet   atorvastatin (LIPITOR) 20 MG tablet   amLODipine (NORVASC) 5 MG tablet    [id number] - [place], [place] - [address] Phone:  [phone]  Fax:  [phone]

## 2019-07-27 DIAGNOSIS — I10 Essential (primary) hypertension: Secondary | ICD-10-CM | POA: Diagnosis not present

## 2019-07-27 DIAGNOSIS — E119 Type 2 diabetes mellitus without complications: Secondary | ICD-10-CM | POA: Diagnosis not present

## 2019-07-27 DIAGNOSIS — E785 Hyperlipidemia, unspecified: Secondary | ICD-10-CM | POA: Diagnosis not present

## 2019-07-28 DIAGNOSIS — Z23 Encounter for immunization: Secondary | ICD-10-CM | POA: Diagnosis not present

## 2019-07-28 LAB — COMPLETE METABOLIC PANEL WITH GFR
AG Ratio: 1.6 (calc) (ref 1.0–2.5)
ALT: 18 U/L (ref 9–46)
AST: 18 U/L (ref 10–35)
Albumin: 4.1 g/dL (ref 3.6–5.1)
Alkaline phosphatase (APISO): 61 U/L (ref 35–144)
BUN/Creatinine Ratio: 11 (calc) (ref 6–22)
BUN: 14 mg/dL (ref 7–25)
CO2: 24 mmol/L (ref 20–32)
Calcium: 9.4 mg/dL (ref 8.6–10.3)
Chloride: 107 mmol/L (ref 98–110)
Creat: 1.28 mg/dL — ABNORMAL HIGH (ref 0.70–1.18)
GFR, Est African American: 64 mL/min/{1.73_m2} (ref >=60)
GFR, Est Non African American: 56 mL/min/{1.73_m2} — ABNORMAL LOW (ref >=60)
Globulin: 2.6 g/dL (calc) (ref 1.9–3.7)
Glucose, Bld: 138 mg/dL — ABNORMAL HIGH (ref 65–99)
Potassium: 4.4 mmol/L (ref 3.5–5.3)
Sodium: 141 mmol/L (ref 135–146)
Total Bilirubin: 0.8 mg/dL (ref 0.2–1.2)
Total Protein: 6.7 g/dL (ref 6.1–8.1)

## 2019-07-28 LAB — LIPID PANEL
Cholesterol: 119 mg/dL (ref <200)
HDL: 47 mg/dL (ref >=40)
LDL Cholesterol (Calc): 58 mg/dL (calc)
Non-HDL Cholesterol (Calc): 72 mg/dL (calc) (ref <130)
Total CHOL/HDL Ratio: 2.5 (calc) (ref <5.0)
Triglycerides: 63 mg/dL (ref <150)

## 2019-07-28 LAB — HEMOGLOBIN A1C
Hgb A1c MFr Bld: 6.8 % of total Hgb — ABNORMAL HIGH (ref <5.7)
Mean Plasma Glucose: 148 (calc)
eAG (mmol/L): 8.2 (calc)

## 2019-08-24 DIAGNOSIS — E119 Type 2 diabetes mellitus without complications: Secondary | ICD-10-CM | POA: Diagnosis not present

## 2019-08-24 DIAGNOSIS — I10 Essential (primary) hypertension: Secondary | ICD-10-CM | POA: Diagnosis not present

## 2019-08-24 DIAGNOSIS — R609 Edema, unspecified: Secondary | ICD-10-CM | POA: Diagnosis not present

## 2019-10-16 ENCOUNTER — Other Ambulatory Visit: Payer: Self-pay | Admitting: Family Medicine

## 2019-10-16 DIAGNOSIS — E119 Type 2 diabetes mellitus without complications: Secondary | ICD-10-CM

## 2019-10-26 DIAGNOSIS — R609 Edema, unspecified: Secondary | ICD-10-CM | POA: Diagnosis not present

## 2019-10-26 DIAGNOSIS — I10 Essential (primary) hypertension: Secondary | ICD-10-CM | POA: Diagnosis not present

## 2019-10-27 DIAGNOSIS — I10 Essential (primary) hypertension: Secondary | ICD-10-CM | POA: Diagnosis not present

## 2019-10-27 DIAGNOSIS — Z712 Person consulting for explanation of examination or test findings: Secondary | ICD-10-CM | POA: Diagnosis not present

## 2019-10-27 DIAGNOSIS — R609 Edema, unspecified: Secondary | ICD-10-CM | POA: Diagnosis not present

## 2019-10-27 DIAGNOSIS — Z0189 Encounter for other specified special examinations: Secondary | ICD-10-CM | POA: Diagnosis not present

## 2019-10-27 DIAGNOSIS — E119 Type 2 diabetes mellitus without complications: Secondary | ICD-10-CM | POA: Diagnosis not present

## 2019-12-04 DIAGNOSIS — Z23 Encounter for immunization: Secondary | ICD-10-CM | POA: Diagnosis not present

## 2020-01-24 DIAGNOSIS — Z23 Encounter for immunization: Secondary | ICD-10-CM | POA: Diagnosis not present

## 2020-01-29 DIAGNOSIS — R609 Edema, unspecified: Secondary | ICD-10-CM | POA: Diagnosis not present

## 2020-01-29 DIAGNOSIS — I10 Essential (primary) hypertension: Secondary | ICD-10-CM | POA: Diagnosis not present

## 2020-01-29 DIAGNOSIS — E119 Type 2 diabetes mellitus without complications: Secondary | ICD-10-CM | POA: Diagnosis not present

## 2020-02-20 ENCOUNTER — Other Ambulatory Visit: Payer: Self-pay | Admitting: Family Medicine

## 2020-02-20 DIAGNOSIS — I10 Essential (primary) hypertension: Secondary | ICD-10-CM

## 2020-03-08 DIAGNOSIS — E119 Type 2 diabetes mellitus without complications: Secondary | ICD-10-CM | POA: Diagnosis not present

## 2020-04-29 ENCOUNTER — Other Ambulatory Visit: Payer: Self-pay | Admitting: Family Medicine

## 2020-04-29 DIAGNOSIS — E785 Hyperlipidemia, unspecified: Secondary | ICD-10-CM

## 2020-05-07 DIAGNOSIS — Z0189 Encounter for other specified special examinations: Secondary | ICD-10-CM | POA: Diagnosis not present

## 2020-05-07 DIAGNOSIS — E119 Type 2 diabetes mellitus without complications: Secondary | ICD-10-CM | POA: Diagnosis not present

## 2020-05-07 DIAGNOSIS — Z712 Person consulting for explanation of examination or test findings: Secondary | ICD-10-CM | POA: Diagnosis not present

## 2020-05-07 DIAGNOSIS — R609 Edema, unspecified: Secondary | ICD-10-CM | POA: Diagnosis not present

## 2020-05-07 DIAGNOSIS — I10 Essential (primary) hypertension: Secondary | ICD-10-CM | POA: Diagnosis not present

## 2020-05-13 DIAGNOSIS — E119 Type 2 diabetes mellitus without complications: Secondary | ICD-10-CM | POA: Diagnosis not present

## 2020-05-13 DIAGNOSIS — I10 Essential (primary) hypertension: Secondary | ICD-10-CM | POA: Diagnosis not present

## 2020-05-13 DIAGNOSIS — E870 Hyperosmolality and hypernatremia: Secondary | ICD-10-CM | POA: Diagnosis not present

## 2020-05-13 DIAGNOSIS — R609 Edema, unspecified: Secondary | ICD-10-CM | POA: Diagnosis not present

## 2020-05-13 DIAGNOSIS — R600 Localized edema: Secondary | ICD-10-CM | POA: Diagnosis not present

## 2020-05-27 DIAGNOSIS — E782 Mixed hyperlipidemia: Secondary | ICD-10-CM | POA: Diagnosis not present

## 2020-05-27 DIAGNOSIS — E119 Type 2 diabetes mellitus without complications: Secondary | ICD-10-CM | POA: Diagnosis not present

## 2020-05-27 DIAGNOSIS — I10 Essential (primary) hypertension: Secondary | ICD-10-CM | POA: Diagnosis not present

## 2020-07-16 DIAGNOSIS — Z23 Encounter for immunization: Secondary | ICD-10-CM | POA: Diagnosis not present

## 2020-08-30 DIAGNOSIS — E119 Type 2 diabetes mellitus without complications: Secondary | ICD-10-CM | POA: Diagnosis not present

## 2020-08-30 DIAGNOSIS — I10 Essential (primary) hypertension: Secondary | ICD-10-CM | POA: Diagnosis not present

## 2020-09-02 DIAGNOSIS — N401 Enlarged prostate with lower urinary tract symptoms: Secondary | ICD-10-CM | POA: Diagnosis not present

## 2020-09-02 DIAGNOSIS — I10 Essential (primary) hypertension: Secondary | ICD-10-CM | POA: Diagnosis not present

## 2020-09-02 DIAGNOSIS — E782 Mixed hyperlipidemia: Secondary | ICD-10-CM | POA: Diagnosis not present

## 2020-09-02 DIAGNOSIS — M25562 Pain in left knee: Secondary | ICD-10-CM | POA: Diagnosis not present

## 2020-09-02 DIAGNOSIS — R600 Localized edema: Secondary | ICD-10-CM | POA: Diagnosis not present

## 2020-09-02 DIAGNOSIS — M25561 Pain in right knee: Secondary | ICD-10-CM | POA: Diagnosis not present

## 2020-09-02 DIAGNOSIS — G8190 Hemiplegia, unspecified affecting unspecified side: Secondary | ICD-10-CM | POA: Diagnosis not present

## 2020-09-02 DIAGNOSIS — E1165 Type 2 diabetes mellitus with hyperglycemia: Secondary | ICD-10-CM | POA: Diagnosis not present

## 2020-11-19 DIAGNOSIS — Z23 Encounter for immunization: Secondary | ICD-10-CM | POA: Diagnosis not present

## 2020-12-04 DIAGNOSIS — E1165 Type 2 diabetes mellitus with hyperglycemia: Secondary | ICD-10-CM | POA: Diagnosis not present

## 2020-12-04 DIAGNOSIS — E782 Mixed hyperlipidemia: Secondary | ICD-10-CM | POA: Diagnosis not present

## 2020-12-04 DIAGNOSIS — Z125 Encounter for screening for malignant neoplasm of prostate: Secondary | ICD-10-CM | POA: Diagnosis not present

## 2020-12-09 DIAGNOSIS — N182 Chronic kidney disease, stage 2 (mild): Secondary | ICD-10-CM | POA: Diagnosis not present

## 2020-12-09 DIAGNOSIS — Z125 Encounter for screening for malignant neoplasm of prostate: Secondary | ICD-10-CM | POA: Diagnosis not present

## 2020-12-09 DIAGNOSIS — N401 Enlarged prostate with lower urinary tract symptoms: Secondary | ICD-10-CM | POA: Diagnosis not present

## 2020-12-09 DIAGNOSIS — E782 Mixed hyperlipidemia: Secondary | ICD-10-CM | POA: Diagnosis not present

## 2020-12-09 DIAGNOSIS — M25561 Pain in right knee: Secondary | ICD-10-CM | POA: Diagnosis not present

## 2020-12-09 DIAGNOSIS — I10 Essential (primary) hypertension: Secondary | ICD-10-CM | POA: Diagnosis not present

## 2020-12-09 DIAGNOSIS — R600 Localized edema: Secondary | ICD-10-CM | POA: Diagnosis not present

## 2020-12-09 DIAGNOSIS — G8190 Hemiplegia, unspecified affecting unspecified side: Secondary | ICD-10-CM | POA: Diagnosis not present

## 2020-12-09 DIAGNOSIS — M25562 Pain in left knee: Secondary | ICD-10-CM | POA: Diagnosis not present

## 2020-12-09 DIAGNOSIS — E1165 Type 2 diabetes mellitus with hyperglycemia: Secondary | ICD-10-CM | POA: Diagnosis not present

## 2020-12-09 DIAGNOSIS — Z0001 Encounter for general adult medical examination with abnormal findings: Secondary | ICD-10-CM | POA: Diagnosis not present

## 2020-12-19 DIAGNOSIS — Z23 Encounter for immunization: Secondary | ICD-10-CM | POA: Diagnosis not present

## 2020-12-23 DIAGNOSIS — E785 Hyperlipidemia, unspecified: Secondary | ICD-10-CM | POA: Diagnosis not present

## 2020-12-23 DIAGNOSIS — I10 Essential (primary) hypertension: Secondary | ICD-10-CM | POA: Diagnosis not present

## 2021-03-06 DIAGNOSIS — E1165 Type 2 diabetes mellitus with hyperglycemia: Secondary | ICD-10-CM | POA: Diagnosis not present

## 2021-03-06 DIAGNOSIS — E782 Mixed hyperlipidemia: Secondary | ICD-10-CM | POA: Diagnosis not present

## 2021-03-10 DIAGNOSIS — E119 Type 2 diabetes mellitus without complications: Secondary | ICD-10-CM | POA: Diagnosis not present

## 2021-03-11 DIAGNOSIS — E782 Mixed hyperlipidemia: Secondary | ICD-10-CM | POA: Diagnosis not present

## 2021-03-11 DIAGNOSIS — E1165 Type 2 diabetes mellitus with hyperglycemia: Secondary | ICD-10-CM | POA: Diagnosis not present

## 2021-03-11 DIAGNOSIS — N401 Enlarged prostate with lower urinary tract symptoms: Secondary | ICD-10-CM | POA: Diagnosis not present

## 2021-03-11 DIAGNOSIS — G8190 Hemiplegia, unspecified affecting unspecified side: Secondary | ICD-10-CM | POA: Diagnosis not present

## 2021-03-11 DIAGNOSIS — I10 Essential (primary) hypertension: Secondary | ICD-10-CM | POA: Diagnosis not present

## 2021-03-11 DIAGNOSIS — M25562 Pain in left knee: Secondary | ICD-10-CM | POA: Diagnosis not present

## 2021-03-11 DIAGNOSIS — R600 Localized edema: Secondary | ICD-10-CM | POA: Diagnosis not present

## 2021-03-11 DIAGNOSIS — M25561 Pain in right knee: Secondary | ICD-10-CM | POA: Diagnosis not present

## 2021-03-11 DIAGNOSIS — N182 Chronic kidney disease, stage 2 (mild): Secondary | ICD-10-CM | POA: Diagnosis not present

## 2021-03-18 DIAGNOSIS — M5442 Lumbago with sciatica, left side: Secondary | ICD-10-CM | POA: Diagnosis not present

## 2021-03-18 DIAGNOSIS — K5900 Constipation, unspecified: Secondary | ICD-10-CM | POA: Diagnosis not present

## 2021-03-25 DIAGNOSIS — I10 Essential (primary) hypertension: Secondary | ICD-10-CM | POA: Diagnosis not present

## 2021-03-25 DIAGNOSIS — E782 Mixed hyperlipidemia: Secondary | ICD-10-CM | POA: Diagnosis not present

## 2021-06-24 ENCOUNTER — Ambulatory Visit
Admission: EM | Admit: 2021-06-24 | Discharge: 2021-06-24 | Disposition: A | Discharge status: Home / Self Care | Payer: Medicare Other | Attending: Family Medicine | Admitting: Family Medicine

## 2021-06-24 ENCOUNTER — Encounter: Payer: Self-pay | Admitting: Emergency Medicine

## 2021-06-24 DIAGNOSIS — S161XXA Strain of muscle, fascia and tendon at neck level, initial encounter: Secondary | ICD-10-CM

## 2021-06-24 MED ORDER — NAPROXEN 500 MG PO TABS: 500.0000 mg | ORAL_TABLET | Freq: Two times a day (BID) | ORAL | 0 refills | Status: AC | PRN

## 2021-06-24 MED ORDER — CYCLOBENZAPRINE HCL 5 MG PO TABS: 5.0000 mg | ORAL_TABLET | Freq: Two times a day (BID) | ORAL | 0 refills | Status: AC | PRN
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
## 2021-06-27 DIAGNOSIS — E782 Mixed hyperlipidemia: Secondary | ICD-10-CM | POA: Diagnosis not present

## 2021-06-27 DIAGNOSIS — E1165 Type 2 diabetes mellitus with hyperglycemia: Secondary | ICD-10-CM | POA: Diagnosis not present

## 2021-07-09 DIAGNOSIS — E1165 Type 2 diabetes mellitus with hyperglycemia: Secondary | ICD-10-CM | POA: Diagnosis not present

## 2021-07-09 DIAGNOSIS — E782 Mixed hyperlipidemia: Secondary | ICD-10-CM | POA: Diagnosis not present

## 2021-07-09 DIAGNOSIS — R600 Localized edema: Secondary | ICD-10-CM | POA: Diagnosis not present

## 2021-07-09 DIAGNOSIS — M25561 Pain in right knee: Secondary | ICD-10-CM | POA: Diagnosis not present

## 2021-07-09 DIAGNOSIS — I10 Essential (primary) hypertension: Secondary | ICD-10-CM | POA: Diagnosis not present

## 2021-07-09 DIAGNOSIS — M542 Cervicalgia: Secondary | ICD-10-CM | POA: Diagnosis not present

## 2021-07-09 DIAGNOSIS — M25562 Pain in left knee: Secondary | ICD-10-CM | POA: Diagnosis not present

## 2021-07-09 DIAGNOSIS — N401 Enlarged prostate with lower urinary tract symptoms: Secondary | ICD-10-CM | POA: Diagnosis not present

## 2021-07-09 DIAGNOSIS — G8190 Hemiplegia, unspecified affecting unspecified side: Secondary | ICD-10-CM | POA: Diagnosis not present

## 2021-07-09 DIAGNOSIS — N182 Chronic kidney disease, stage 2 (mild): Secondary | ICD-10-CM | POA: Diagnosis not present

## 2021-10-15 DIAGNOSIS — M25561 Pain in right knee: Secondary | ICD-10-CM | POA: Diagnosis not present

## 2021-10-15 DIAGNOSIS — G8190 Hemiplegia, unspecified affecting unspecified side: Secondary | ICD-10-CM | POA: Diagnosis not present

## 2021-10-15 DIAGNOSIS — Z6829 Body mass index (BMI) 29.0-29.9, adult: Secondary | ICD-10-CM | POA: Diagnosis not present

## 2021-10-15 DIAGNOSIS — I10 Essential (primary) hypertension: Secondary | ICD-10-CM | POA: Diagnosis not present

## 2021-10-15 DIAGNOSIS — N401 Enlarged prostate with lower urinary tract symptoms: Secondary | ICD-10-CM | POA: Diagnosis not present

## 2021-10-15 DIAGNOSIS — E782 Mixed hyperlipidemia: Secondary | ICD-10-CM | POA: Diagnosis not present

## 2021-10-15 DIAGNOSIS — Z1211 Encounter for screening for malignant neoplasm of colon: Secondary | ICD-10-CM | POA: Diagnosis not present

## 2021-10-15 DIAGNOSIS — R600 Localized edema: Secondary | ICD-10-CM | POA: Diagnosis not present

## 2021-10-15 DIAGNOSIS — E1165 Type 2 diabetes mellitus with hyperglycemia: Secondary | ICD-10-CM | POA: Diagnosis not present

## 2021-10-15 DIAGNOSIS — M542 Cervicalgia: Secondary | ICD-10-CM | POA: Diagnosis not present

## 2021-10-15 DIAGNOSIS — N182 Chronic kidney disease, stage 2 (mild): Secondary | ICD-10-CM | POA: Diagnosis not present

## 2021-10-15 DIAGNOSIS — M25562 Pain in left knee: Secondary | ICD-10-CM | POA: Diagnosis not present

## 2021-11-03 ENCOUNTER — Encounter: Payer: Self-pay | Admitting: *Deleted

## 2021-11-17 NOTE — Progress Notes (Deleted)
Referring Provider:Harris, Merry Proud, NP Primary Care Physician:  Benita Stabile, MD Primary Gastroenterologist:  Dr. Bonnetta Barry chief complaint on file.   HPI:   [name] is a 76 y.o. male presenting today at the request of Cristino Martes, NP for constipation and diarrhea.   Past Medical History:  Diagnosis Date   Arthritis    Cataract    Diabetes mellitus without complication (HCC)    Hypertension     No past surgical history on file.  Current Outpatient Medications  Medication Sig Dispense Refill   amLODipine (NORVASC) 5 MG tablet TAKE 1 TABLET(5 MG) BY MOUTH DAILY 90 tablet 1   amLODipine-valsartan (EXFORGE) 5-160 MG tablet Take 1 tablet by mouth daily. 90 tablet 1   atorvastatin (LIPITOR) 20 MG tablet Take 1 tablet (20 mg total) by mouth daily. 90 tablet 1   cyclobenzaprine (FLEXERIL) 10 MG tablet Take 1/2-1 po qhs 30 tablet 0   cyclobenzaprine (FLEXERIL) 5 MG tablet Take 1 tablet (5 mg total) by mouth 2 (two) times daily as needed for muscle spasms. Do not drink alcohol or drive while taking this medication.  May cause drowsiness 10 tablet 0   glipizide-metformin (METAGLIP) 2.5-250 MG tablet Take 1 tablet by mouth 2 (two) times daily before a meal. 90 tablet 1   glucose blood test strip FreeStyle Lite Strips     ibuprofen (ADVIL,MOTRIN) 200 MG tablet Take 400 mg by mouth every 6 (six) hours as needed for moderate pain.     naproxen (NAPROSYN) 500 MG tablet Take 1 tablet (500 mg total) by mouth 2 (two) times daily as needed. 30 tablet 0   No current facility-administered medications for this visit.    Allergies as of 11/20/2021 - Review Complete 06/24/2021  Allergen Reaction Noted   Ciprofloxacin  10/07/2013    Family History  Problem Relation Age of Onset   Diabetes Mother    Stroke Mother    Diabetes Father    Cancer Father    Diabetes Brother     Social History   Socioeconomic History   Marital status: Married    Spouse name: Not on file   Number of  children: Not on file   Years of education: Not on file   Highest education level: Not on file  Occupational History   Not on file  Tobacco Use   Smoking status: Never   Smokeless tobacco: Never  Vaping Use   Vaping Use: Never used  Substance and Sexual Activity   Alcohol use: No   Drug use: No   Sexual activity: Not Currently  Other Topics Concern   Not on file  Social History Narrative   Not on file   Social Determinants of Health   Financial Resource Strain: Not on file  Food Insecurity: Not on file  Transportation Needs: Not on file  Physical Activity: Not on file  Stress: Not on file  Social Connections: Not on file  Intimate Partner Violence: Not on file    Review of Systems: Gen: Denies any fever, chills, cold or flu like symptoms, pre-syncope, or syncope.  CV: Denies chest pain, heart palpitations. Resp: Denies shortness of breath, cough.  GI: See HPI GU : Denies urinary burning, urinary frequency, urinary hesitancy MS: Denies joint pain. Derm: Denies rash. Psych: Denies depression, anxiety. Heme:  See HPI  Physical Exam: There were no vitals taken for this visit. General:   Alert and oriented. Pleasant and cooperative. Well-nourished and well-developed.  Head:  Normocephalic  and atraumatic. Eyes:  Without icterus, sclera clear and conjunctiva pink.  Ears:  Normal auditory acuity. Lungs:  Clear to auscultation bilaterally. No wheezes, rales, or rhonchi. No distress.  Heart:  S1, S2 present without murmurs appreciated.  Abdomen:  +BS, soft, non-tender and non-distended. No HSM noted. No guarding or rebound. No masses appreciated.  Rectal:  Deferred  Msk:  Symmetrical without gross deformities. Normal posture. Extremities:  Without edema. Neurologic:  Alert and  oriented x4;  grossly normal neurologically. Skin:  Intact without significant lesions or rashes. Psych: Normal mood and affect.    Assessment:     Plan:  ***   Aliene Altes,  PA-C [facility] 11/20/2021

## 2021-11-20 ENCOUNTER — Ambulatory Visit: Payer: Medicare Other | Admitting: Gastroenterology

## 2021-12-16 ENCOUNTER — Encounter (INDEPENDENT_AMBULATORY_CARE_PROVIDER_SITE_OTHER): Payer: Self-pay | Admitting: *Deleted

## 2021-12-19 ENCOUNTER — Other Ambulatory Visit: Payer: Self-pay | Admitting: Family

## 2021-12-19 DIAGNOSIS — R748 Abnormal levels of other serum enzymes: Secondary | ICD-10-CM

## 2022-05-25 ENCOUNTER — Encounter (INDEPENDENT_AMBULATORY_CARE_PROVIDER_SITE_OTHER): Payer: Self-pay | Admitting: *Deleted

## 2022-08-03 ENCOUNTER — Ambulatory Visit: Payer: Self-pay | Admitting: Podiatry

## 2022-08-06 ENCOUNTER — Ambulatory Visit: Payer: Medicare HMO | Admitting: Podiatry

## 2022-08-06 DIAGNOSIS — Z79899 Other long term (current) drug therapy: Secondary | ICD-10-CM

## 2022-08-06 DIAGNOSIS — B351 Tinea unguium: Secondary | ICD-10-CM | POA: Diagnosis not present
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
## 2022-08-06 NOTE — Patient Instructions (Signed)
Terbinafine Tablets What is this medication? TERBINAFINE (TER bin a feen) treats fungal infections of the nails. It belongs to a group of medications called antifungals. It will not treat infections caused by bacteria or viruses. This medicine may be used for other purposes; ask your health care provider or pharmacist if you have questions. COMMON BRAND NAME(S): Lamisil, Terbinex What should I tell my care team before I take this medication? They need to know if you have any of these conditions: Liver disease An unusual or allergic reaction to terbinafine, other medications, foods, dyes, or preservatives Pregnant or trying to get pregnant Breast-feeding How should I use this medication? Take this medication by mouth with water. Take it as directed on the prescription label at the same time every day. You can take it with or without food. If it upsets your stomach, take it with food. Keep taking it unless your care team tells you to stop. A special MedGuide will be given to you by the pharmacist with each prescription and refill. Be sure to read this information carefully each time. Talk to your care team regarding the use of this medication in children. Special care may be needed. Overdosage: If you think you have taken too much of this medicine contact a poison control center or emergency room at once. NOTE: This medicine is only for you. Do not share this medicine with others. What if I miss a dose? If you miss a dose, take it as soon as you can unless it is more than 4 hours late. If it is more than 4 hours late, skip the missed dose. Take the next dose at the normal time. What may interact with this medication? Do not take this medication with any of the following: Pimozide Thioridazine This medication may also interact with the following: Beta blockers Caffeine Certain medications for mental health conditions Cimetidine Cyclosporine Medications for fungal infections like fluconazole  and ketoconazole Medications for irregular heartbeat like amiodarone, flecainide and propafenone Rifampin Warfarin This list may not describe all possible interactions. Give your health care provider a list of all the medicines, herbs, non-prescription drugs, or dietary supplements you use. Also tell them if you smoke, drink alcohol, or use illegal drugs. Some items may interact with your medicine. What should I watch for while using this medication? Visit your care team for regular checks on your progress. You may need blood work while you are taking this medication. It may be some time before you see the benefit from this medication. This medication may cause serious skin reactions. They can happen weeks to months after starting the medication. Contact your care team right away if you notice fevers or flu-like symptoms with a rash. The rash may be red or purple and then turn into blisters or peeling of the skin. Or, you might notice a red rash with swelling of the face, lips or lymph nodes in your neck or under your arms. This medication can make you more sensitive to the sun. Keep out of the sun, If you cannot avoid being in the sun, wear protective clothing and sunscreen. Do not use sun lamps or tanning beds/booths. What side effects may I notice from receiving this medication? Side effects that you should report to your care team as soon as possible: Allergic reactions--skin rash, itching, hives, swelling of the face, lips, tongue, or throat Change in sense of smell Change in taste Infection--fever, chills, cough, or sore throat Liver injury--right upper belly pain, loss of appetite, nausea,   light-colored stool, dark yellow or brown urine, yellowing skin or eyes, unusual weakness or fatigue Low red blood cell level--unusual weakness or fatigue, dizziness, headache, trouble breathing Lupus-like syndrome--joint pain, swelling, or stiffness, butterfly-shaped rash on the face, rashes that get worse  in the sun, fever, unusual weakness or fatigue Rash, fever, and swollen lymph nodes Redness, blistering, peeling, or loosening of the skin, including inside the mouth Unusual bruising or bleeding Worsening mood, feelings of depression Side effects that usually do not require medical attention (report to your care team if they continue or are bothersome): Diarrhea Gas Headache Nausea Stomach pain Upset stomach This list may not describe all possible side effects. Call your doctor for medical advice about side effects. You may report side effects to FDA at 1-800-FDA-1088. Where should I keep my medication? Keep out of the reach of children and pets. Store between 20 and 25 degrees C (68 and 77 degrees F). Protect from light. Get rid of any unused medication after the expiration date. To get rid of medications that are no longer needed or have expired: Take the medication to a medication take-back program. Check with your pharmacy or law enforcement to find a location. If you cannot return the medication, check the label or package insert to see if the medication should be thrown out in the garbage or flushed down the toilet. If you are not sure, ask your care team. If it is safe to put it in the trash, take the medication out of the container. Mix the medication with cat litter, dirt, coffee grounds, or other unwanted substance. Seal the mixture in a bag or container. Put it in the trash. NOTE: This sheet is a summary. It may not cover all possible information. If you have questions about this medicine, talk to your doctor, pharmacist, or health care provider.  2024 Elsevier/Gold Standard (2020-09-25 00:00:00)  

## 2022-08-07 LAB — COMPREHENSIVE METABOLIC PANEL
ALT: 16 IU/L (ref 0–44)
AST: 20 IU/L (ref 0–40)
Albumin/Globulin Ratio: 2.2
Albumin: 4.6 g/dL (ref 3.8–4.8)
Alkaline Phosphatase: 68 IU/L (ref 44–121)
BUN/Creatinine Ratio: 11 (ref 10–24)
BUN: 18 mg/dL (ref 8–27)
Bilirubin Total: 0.8 mg/dL (ref 0.0–1.2)
CO2: 22 mmol/L (ref 20–29)
Calcium: 9.4 mg/dL (ref 8.6–10.2)
Chloride: 105 mmol/L (ref 96–106)
Creatinine, Ser: 1.66 mg/dL — ABNORMAL HIGH (ref 0.76–1.27)
Globulin, Total: 2.1 g/dL (ref 1.5–4.5)
Glucose: 138 mg/dL — ABNORMAL HIGH (ref 70–99)
Potassium: 4.7 mmol/L (ref 3.5–5.2)
Sodium: 141 mmol/L (ref 134–144)
Total Protein: 6.7 g/dL (ref 6.0–8.5)
eGFR: 43 mL/min/{1.73_m2} — ABNORMAL LOW (ref >59)

## 2022-08-07 LAB — CBC WITH DIFFERENTIAL/PLATELET
Basophils Absolute: 0.1 10*3/uL (ref 0.0–0.2)
Basos: 1 %
EOS (ABSOLUTE): 0.1 10*3/uL (ref 0.0–0.4)
Eos: 2 %
Hematocrit: 43.1 % (ref 37.5–51.0)
Hemoglobin: 14.4 g/dL (ref 13.0–17.7)
Immature Grans (Abs): 0 10*3/uL (ref 0.0–0.1)
Immature Granulocytes: 0 %
Lymphocytes Absolute: 1.8 10*3/uL (ref 0.7–3.1)
Lymphs: 33 %
MCH: 28.8 pg (ref 26.6–33.0)
MCHC: 33.4 g/dL (ref 31.5–35.7)
MCV: 86 fL (ref 79–97)
Monocytes Absolute: 0.4 10*3/uL (ref 0.1–0.9)
Monocytes: 7 %
Neutrophils Absolute: 3.3 10*3/uL (ref 1.4–7.0)
Neutrophils: 57 %
Platelets: 225 10*3/uL (ref 150–450)
RBC: 5 x10E6/uL (ref 4.14–5.80)
RDW: 12.3 % (ref 11.6–15.4)
WBC: 5.7 10*3/uL (ref 3.4–10.8)

## 2022-08-10 ENCOUNTER — Other Ambulatory Visit: Payer: Self-pay | Admitting: Podiatry

## 2022-08-10 MED ORDER — CICLOPIROX 8 % EX SOLN: Freq: Every day | CUTANEOUS | 2 refills | Status: AC
